# Patient Record
Sex: Female | Born: 1960
Health system: Southern US, Community
[De-identification: ages and names within clinical notes are randomized; demographics above are authoritative.]

## PROBLEM LIST (undated history)

## (undated) ENCOUNTER — Emergency Department (HOSPITAL_COMMUNITY): Admission: EM | Payer: Self-pay | Source: Home / Self Care

## (undated) DIAGNOSIS — F32A Depression, unspecified: Secondary | ICD-10-CM

## (undated) DIAGNOSIS — Z550 Illiteracy and low-level literacy: Secondary | ICD-10-CM

## (undated) DIAGNOSIS — K5900 Constipation, unspecified: Secondary | ICD-10-CM

## (undated) DIAGNOSIS — D689 Coagulation defect, unspecified: Secondary | ICD-10-CM

## (undated) DIAGNOSIS — Z933 Colostomy status: Secondary | ICD-10-CM

## (undated) DIAGNOSIS — G479 Sleep disorder, unspecified: Secondary | ICD-10-CM

## (undated) HISTORY — DX: Depression, unspecified: F32.A

## (undated) HISTORY — DX: Colostomy status: Z93.3

## (undated) HISTORY — DX: Illiteracy and low-level literacy: Z55.0

## (undated) HISTORY — DX: Coagulation defect, unspecified: D68.9

## (undated) HISTORY — DX: Constipation, unspecified: K59.00

---

## 1988-11-22 HISTORY — PX: DILATION AND CURETTAGE OF UTERUS: SHX78

## 1991-11-23 HISTORY — PX: APPENDECTOMY: SHX54

## 1992-11-22 HISTORY — PX: ABDOMINAL HYSTERECTOMY: SHX81

## 1998-03-18 ENCOUNTER — Emergency Department (HOSPITAL_COMMUNITY): Admission: EM | Admit: 1998-03-18 | Discharge: 1998-03-18 | Payer: Self-pay

## 2001-06-22 ENCOUNTER — Encounter (INDEPENDENT_AMBULATORY_CARE_PROVIDER_SITE_OTHER): Payer: Self-pay | Admitting: *Deleted

## 2001-06-22 LAB — CONVERTED CEMR LAB

## 2001-07-19 ENCOUNTER — Encounter: Admission: RE | Admit: 2001-07-19 | Discharge: 2001-07-19 | Payer: Self-pay | Admitting: Family Medicine

## 2001-08-03 ENCOUNTER — Encounter: Admission: RE | Admit: 2001-08-03 | Discharge: 2001-08-03 | Payer: Self-pay | Admitting: Family Medicine

## 2002-07-10 ENCOUNTER — Emergency Department (HOSPITAL_COMMUNITY): Admission: EM | Admit: 2002-07-10 | Discharge: 2002-07-10 | Payer: Self-pay | Admitting: Emergency Medicine

## 2002-07-12 ENCOUNTER — Emergency Department (HOSPITAL_COMMUNITY): Admission: EM | Admit: 2002-07-12 | Discharge: 2002-07-12 | Payer: Self-pay | Admitting: Emergency Medicine

## 2002-09-24 ENCOUNTER — Encounter: Admission: RE | Admit: 2002-09-24 | Discharge: 2002-09-24 | Payer: Self-pay | Admitting: Family Medicine

## 2002-10-24 ENCOUNTER — Encounter: Admission: RE | Admit: 2002-10-24 | Discharge: 2002-10-24 | Payer: Self-pay | Admitting: Family Medicine

## 2002-11-14 ENCOUNTER — Encounter: Payer: Self-pay | Admitting: Sports Medicine

## 2002-11-14 ENCOUNTER — Encounter: Admission: RE | Admit: 2002-11-14 | Discharge: 2002-11-14 | Payer: Self-pay | Admitting: Sports Medicine

## 2004-06-19 ENCOUNTER — Emergency Department (HOSPITAL_COMMUNITY): Admission: EM | Admit: 2004-06-19 | Discharge: 2004-06-20 | Payer: Self-pay | Admitting: Emergency Medicine

## 2004-09-02 ENCOUNTER — Emergency Department (HOSPITAL_COMMUNITY): Admission: EM | Admit: 2004-09-02 | Discharge: 2004-09-03 | Payer: Self-pay | Admitting: Emergency Medicine

## 2004-10-06 ENCOUNTER — Emergency Department (HOSPITAL_COMMUNITY): Admission: EM | Admit: 2004-10-06 | Discharge: 2004-10-06 | Payer: Self-pay | Admitting: Family Medicine

## 2005-11-16 ENCOUNTER — Emergency Department (HOSPITAL_COMMUNITY): Admission: EM | Admit: 2005-11-16 | Discharge: 2005-11-16 | Payer: Self-pay | Admitting: Emergency Medicine

## 2007-01-19 DIAGNOSIS — H9190 Unspecified hearing loss, unspecified ear: Secondary | ICD-10-CM | POA: Insufficient documentation

## 2007-01-19 DIAGNOSIS — N951 Menopausal and female climacteric states: Secondary | ICD-10-CM | POA: Insufficient documentation

## 2007-01-19 DIAGNOSIS — F17200 Nicotine dependence, unspecified, uncomplicated: Secondary | ICD-10-CM | POA: Insufficient documentation

## 2007-01-20 ENCOUNTER — Encounter (INDEPENDENT_AMBULATORY_CARE_PROVIDER_SITE_OTHER): Payer: Self-pay | Admitting: *Deleted

## 2008-03-29 ENCOUNTER — Emergency Department (HOSPITAL_COMMUNITY): Admission: EM | Admit: 2008-03-29 | Discharge: 2008-03-29 | Payer: Self-pay | Admitting: Family Medicine

## 2009-07-23 ENCOUNTER — Emergency Department (HOSPITAL_COMMUNITY): Admission: EM | Admit: 2009-07-23 | Discharge: 2009-07-23 | Payer: Self-pay | Admitting: Emergency Medicine

## 2011-08-18 LAB — POCT RAPID STREP A: Streptococcus, Group A Screen (Direct): NEGATIVE

## 2011-09-17 ENCOUNTER — Emergency Department (HOSPITAL_COMMUNITY)
Admission: EM | Admit: 2011-09-17 | Discharge: 2011-09-18 | Disposition: A | Discharge status: Home / Self Care | Payer: 59 | Attending: Emergency Medicine | Admitting: Emergency Medicine

## 2011-09-17 ENCOUNTER — Emergency Department (HOSPITAL_COMMUNITY): Payer: 59

## 2011-09-17 DIAGNOSIS — L84 Corns and callosities: Secondary | ICD-10-CM | POA: Insufficient documentation

## 2011-09-17 DIAGNOSIS — M79609 Pain in unspecified limb: Secondary | ICD-10-CM | POA: Insufficient documentation

## 2011-10-18 ENCOUNTER — Encounter: Payer: Self-pay | Admitting: Family Medicine

## 2011-10-18 ENCOUNTER — Ambulatory Visit (INDEPENDENT_AMBULATORY_CARE_PROVIDER_SITE_OTHER): Payer: 59 | Admitting: Family Medicine

## 2011-10-18 VITALS — BP 122/79 | HR 67 | Temp 98.2°F | Ht <= 58 in | Wt 73.0 lb

## 2011-10-18 DIAGNOSIS — N951 Menopausal and female climacteric states: Secondary | ICD-10-CM

## 2011-10-18 DIAGNOSIS — L84 Corns and callosities: Secondary | ICD-10-CM | POA: Insufficient documentation

## 2011-10-18 DIAGNOSIS — H9190 Unspecified hearing loss, unspecified ear: Secondary | ICD-10-CM

## 2011-10-18 DIAGNOSIS — F17200 Nicotine dependence, unspecified, uncomplicated: Secondary | ICD-10-CM

## 2011-10-18 MED ORDER — SALICYLIC ACID 40 % EX PADS: 1.0000 [IU] | MEDICATED_PAD | CUTANEOUS | Status: DC

## 2011-10-18 NOTE — Patient Instructions (Signed)
I think you have a corn.   Clean toe. Shave down any excess skin. Then apply the plaster on your toe; keep it on for 48 hours and keep your toe dry. Clean toe then file down any excess skin and repeat until the corn goes away.  Follow-up with me in 2 weeks so I can see if the corn is improving and for a full physical.

## 2011-10-18 NOTE — Progress Notes (Signed)
  Subjective:    Patient ID: [name] [name], female    DOB: [birth year], 50 y.o.   MRN: [id number]  HPI New patient here to get established but also presents with right toe lesion that is her primary reason for coming.  No significant medical history. Does not take medications on a regular basis. Occasional Tylenol/ibuprofen.   Lesion at the bottom of right toe began 2-3 months ago. Started having pain initially and developed callus. Then stepped on a rock and pain became worse. Denies discharge or bleeding. Denies fever, decreased sensation, difficulty walking. Has tried picking at lesion with needle to remove skin but has not helped it.  Exacerbated by: putting weight on toe or palpation Alleviated by: nothing.   Review of Systems Per HPI.    Objective:   Physical Exam Gen: NAD, accompanied by daughter Psych: engaged, pleasant, difficulty understanding her English but she seems to understand most of what I say, her daughter serves as interpreter  CV: RRR Pulm: no increased WOB, CTAB Ext: no edema Skin:   Right toe: mildly thickened area of skin on medial plantar aspect of right toe; able to see concentric areas of thickening consistent with corn as opposed to callus; two punctate areas of dried blood within skin but no splinters or other foreign objects visualized; no warmth, erythema, swelling; mild TTP    Assessment & Plan:

## 2011-10-18 NOTE — Assessment & Plan Note (Signed)
Will try salicyclic acid plaster.  Will follow-up in 2 weeks when she will get physical.

## 2011-11-05 ENCOUNTER — Ambulatory Visit: Payer: 59 | Admitting: Family Medicine

## 2011-11-12 ENCOUNTER — Encounter: Payer: Self-pay | Admitting: Family Medicine

## 2011-11-12 ENCOUNTER — Other Ambulatory Visit (HOSPITAL_COMMUNITY)
Admission: RE | Admit: 2011-11-12 | Discharge: 2011-11-12 | Disposition: A | Discharge status: Home / Self Care | Payer: 59 | Source: Ambulatory Visit | Attending: Family Medicine | Admitting: Family Medicine

## 2011-11-12 ENCOUNTER — Ambulatory Visit (INDEPENDENT_AMBULATORY_CARE_PROVIDER_SITE_OTHER): Payer: 59 | Admitting: Family Medicine

## 2011-11-12 VITALS — BP 118/74 | HR 71 | Temp 97.7°F | Ht <= 58 in | Wt 75.4 lb

## 2011-11-12 DIAGNOSIS — L989 Disorder of the skin and subcutaneous tissue, unspecified: Secondary | ICD-10-CM

## 2011-11-12 DIAGNOSIS — L84 Corns and callosities: Secondary | ICD-10-CM

## 2011-11-12 DIAGNOSIS — Z124 Encounter for screening for malignant neoplasm of cervix: Secondary | ICD-10-CM

## 2011-11-12 DIAGNOSIS — F17200 Nicotine dependence, unspecified, uncomplicated: Secondary | ICD-10-CM

## 2011-11-12 DIAGNOSIS — Z01419 Encounter for gynecological examination (general) (routine) without abnormal findings: Secondary | ICD-10-CM | POA: Insufficient documentation

## 2011-11-12 MED ORDER — NICOTINE POLACRILEX 4 MG MT GUM: 4.0000 mg | CHEWING_GUM | OROMUCOSAL | Status: AC | PRN

## 2011-11-12 MED ORDER — NICOTINE POLACRILEX 4 MG MT GUM: 4.0000 mg | CHEWING_GUM | OROMUCOSAL | Status: DC | PRN

## 2011-11-12 NOTE — Assessment & Plan Note (Signed)
Improved. Continue plasters with duct tape.

## 2011-11-12 NOTE — Assessment & Plan Note (Signed)
Few millimeters in width and length. Likely a mole. It has irregular borders but has been there for a long period of time and does not seem to be changing. Discussed options with family. Suggested biopsy today but thought it would also be appropriate to wait and re-evaluate in 6 months. She does not have risk factors for melanoma (she is dark-skinned, does not have moles any where else, and this would be an unusual spot for a melanoma).

## 2011-11-12 NOTE — Progress Notes (Signed)
  Subjective:    Patient ID: [name] [name], female    DOB: January 12, 1961, 50 y.o.   MRN: [id number]  HPI 1. Corn on toe Using plasters. Finished box. Feels it is improved but still painful sometimes with deep palpation.   2. Physical No other complaints. Wondering if she has high blood pressure.  Not sure when she had her last Pap.  Hysterectomy at [facility] hospital in the past.   3. Tobacco Interested in quitting.  Review of Systems Denies chest pain, difficulty breathing, abdominal pain/nausea/vomiting/diarrhea/constipation, vaginal irritation/abnormal vaginal discharge Sexually active with husband only but not for a while    Objective:   Physical Exam Gen: NAD, accompanied by daughter who interpreters Psych: very pleasant, engaged, appropriate; fully alert and oriented Neuro: grossly intact; gait normal HEENT:   Head, ears, eyes, nose: normal   Mouth: poor dentition, missing few teeth; no tonsillar adenopathy; no oropharyngeal lesions CV: RRR, no m/r/g Pulm: CTAB, no w/r/r; good aeration Abd: NABS, soft, NT, ND Ext: no edema Skin: thickened area of skin with central point on plantar aspect of rig t toe; seems to be less thick than a month ago at her previous visit GU:    Vulva: 3 mm x 5 mm dark lesion, 5 o'clock position, lateral to left vulva; patient reports some times itchy; has had for a long time; does not think it is changing   Vagina: non-tender   Cervix: none, cuff only; no discharge Skin: grossly normal with occasional flesh-colored mole; no significant solar freckling    Assessment & Plan:

## 2011-11-12 NOTE — Patient Instructions (Signed)
If your lab results are normal, I will send you a letter with the results. If abnormal, someone at the clinic will get in touch with you.   Right toe corn: use the plaster on the toe. Keep it on for 3-4 days then wrap toe with duct tape. You may also try getting a cushion at the pharmacy to put on that toe.  Dark skin spot on bottom: follow-up in 6 months or sooner if you feel like it is changing.  Smoking: I am glad to hear you want to quit. Try the gum or the lozenges whenever you have an urge to smoke.

## 2011-11-12 NOTE — Assessment & Plan Note (Signed)
Smoked for a long time. Urge to smoke after dinner and when stressed. Interested in quitting. Has not tried anything.  Will try nicotine replacement gum/lozenges to be used after meals and when she feels an urge to smoke. Follow-up in 3-6 months.

## 2011-11-18 ENCOUNTER — Encounter: Payer: Self-pay | Admitting: Family Medicine

## 2012-06-02 ENCOUNTER — Ambulatory Visit: Payer: 59 | Admitting: Family Medicine

## 2012-06-07 ENCOUNTER — Ambulatory Visit (INDEPENDENT_AMBULATORY_CARE_PROVIDER_SITE_OTHER): Payer: 59 | Admitting: Family Medicine

## 2012-06-07 VITALS — BP 145/99 | HR 89 | Temp 98.5°F | Ht <= 58 in | Wt 75.0 lb

## 2012-06-07 DIAGNOSIS — L84 Corns and callosities: Secondary | ICD-10-CM

## 2012-06-07 MED ORDER — SALICYLIC ACID 40 % EX PADS: 1.0000 [IU] | MEDICATED_PAD | CUTANEOUS | Status: DC

## 2012-06-07 NOTE — Progress Notes (Signed)
  Subjective:    Patient ID: [name] [name], female    DOB: 1961-09-14, 51 y.o.   MRN: [id number]  HPI Corn on right toe has returned  She is wearing flip-flops today. She alternates with white tennis shoes.   Review of Systems  Past Medical History, Family History, Social History, Allergies, and Medications reviewed. Significant for tobacco dependence, hearing difficulty    Objective:   Physical Exam GEN: NAD; well-nourished, -appearing; thin SKIN: corn on outer right 1st toe with central core; tender  PROCEDURES Corn pared down with #15 blade Central core still visible, however, stopped procedure due to pain     Assessment & Plan:

## 2012-06-07 NOTE — Patient Instructions (Addendum)
Follow-up as needed.   Try the patch and wrap with duct tape. Keep in place for 2 days then replace. Use for 2 weeks.

## 2012-06-07 NOTE — Assessment & Plan Note (Signed)
Pared down. Does not appear verrucous.  Salicylic acid plasters and duct tape for 2 weeks.  Follow-up as needed.

## 2013-12-10 ENCOUNTER — Ambulatory Visit (INDEPENDENT_AMBULATORY_CARE_PROVIDER_SITE_OTHER): Payer: 59 | Admitting: Family Medicine

## 2013-12-10 ENCOUNTER — Encounter: Payer: Self-pay | Admitting: Family Medicine

## 2013-12-10 VITALS — BP 121/74 | HR 78 | Temp 98.8°F | Ht <= 58 in | Wt 79.0 lb

## 2013-12-10 DIAGNOSIS — G5790 Unspecified mononeuropathy of unspecified lower limb: Secondary | ICD-10-CM

## 2013-12-10 DIAGNOSIS — N951 Menopausal and female climacteric states: Secondary | ICD-10-CM

## 2013-12-10 DIAGNOSIS — F17200 Nicotine dependence, unspecified, uncomplicated: Secondary | ICD-10-CM

## 2013-12-10 LAB — BASIC METABOLIC PANEL
BUN: 9 mg/dL (ref 6–23)
CHLORIDE: 101 meq/L (ref 96–112)
CO2: 30 meq/L (ref 19–32)
Calcium: 9.3 mg/dL (ref 8.4–10.5)
Creat: 0.57 mg/dL (ref 0.50–1.10)
GLUCOSE: 73 mg/dL (ref 70–99)
POTASSIUM: 3.5 meq/L (ref 3.5–5.3)
Sodium: 139 mEq/L (ref 135–145)

## 2013-12-10 LAB — CBC
HEMATOCRIT: 38.7 % (ref 36.0–46.0)
HEMOGLOBIN: 12 g/dL (ref 12.0–15.0)
MCH: 22.9 pg — ABNORMAL LOW (ref 26.0–34.0)
MCHC: 31 g/dL (ref 30.0–36.0)
MCV: 73.7 fL — ABNORMAL LOW (ref 78.0–100.0)
Platelets: 195 10*3/uL (ref 150–400)
RBC: 5.25 MIL/uL — ABNORMAL HIGH (ref 3.87–5.11)
RDW: 15.7 % — ABNORMAL HIGH (ref 11.5–15.5)
WBC: 5.6 10*3/uL (ref 4.0–10.5)

## 2013-12-10 LAB — POCT GLYCOSYLATED HEMOGLOBIN (HGB A1C): HEMOGLOBIN A1C: 4.9

## 2013-12-10 MED ORDER — NICOTINE 7 MG/24HR TD PT24: 7.0000 mg | MEDICATED_PATCH | Freq: Every day | TRANSDERMAL | Status: DC

## 2013-12-10 MED ORDER — NICOTINE 14 MG/24HR TD PT24: 14.0000 mg | MEDICATED_PATCH | Freq: Every day | TRANSDERMAL | Status: DC

## 2013-12-10 NOTE — Assessment & Plan Note (Signed)
A: smoker x 36 yrs. 5 cigs/day. Marl[place] reg. Ready to quit. Daughter smokes too and is ready to quit.  P: Nicotine patch Health coaching.

## 2013-12-10 NOTE — Assessment & Plan Note (Signed)
A: patient is describing hot flashes. She is a smoker. She does not drink coffee or chocolate. P: Reassurance Information Smoking cessation PCP f/u.

## 2013-12-10 NOTE — Assessment & Plan Note (Signed)
A: foot burning with numbness upon standing for prolonged periods and first thing in the morning. Patient adamantly denies pain. Sounds like neuropathy. Considered plantar fascitis. Normal peripheral pulse in feet. P: Check B12, TSH, A1c PCP f/u to review labs and do full vascular exam including femoral and popliteal pulses. Smoking cessation

## 2013-12-10 NOTE — Patient Instructions (Signed)
Ms. [name] [name],  Thank you for coming in today. It was a pleasure meeting you.   Smoking cessation support: smoking cessation hotline: 1-800-QUIT-NOW.  Here is the [facility]: [phone]  I have sent nicotine patch to your pharmacy as we dicussed.  Please start nicotine patch.  You're whole body is hot due to hot flashes.  To improve hot flashes: avoid smoking, avoid caffeine (coffee, tea, chocolate), avoid spicy foods. Exercise daily. Drink plenty of water.   Please f/u with Dr. Waynetta SandyWight in 2-3 weeks to meet your new primary doctor and review your labs.

## 2013-12-10 NOTE — Progress Notes (Addendum)
   Subjective:    Patient ID: [name] [name], female    DOB: [birth year], 53 y.o.   MRN: [id number]  HPI 53 yo F presents with her adult daughter and granddaughter for same day visit:  1. Hot feet: worse in the AM upon first getting out of bed. Also when standing  For 2-3 hrs. No pain. No coldness. Worse on dorsal foot. Some numbness. Denies pens and needle sensation.   2. Hot body: off and on throughout the day. Worse at night. Associated with  Sweating. Present since 01/2013.   Soc hx: smoker 5 cigs/day x 36 years (since age 53). Ready to quit. Has tried gum in the past, burned her mouth. Her daughter also smokes and would like to quit.  Review of Systems As per HPI     Objective:   Physical Exam BP 121/74  Pulse 78  Temp(Src) 98.8 F (37.1 C) (Oral)  Ht 4\' 8"  (1.422 m)  Wt 79 lb (35.834 kg)  BMI 17.72 kg/m2 General appearance: alert, cooperative and no distress Neck: no adenopathy, no carotid bruit, no JVD, supple, symmetrical, trachea midline and thyroid not enlarged, symmetric, no tenderness/mass/nodules Lungs: clear to auscultation bilaterally Heart: regular rate and rhythm, S1, S2 normal, no murmur, click, rub or gallop Extremities: extremities normal, atraumatic, no cyanosis or edema, 2+ DP and PT pulse. Feet warm and dry. Feet non tender to palpation. No rash or lesions.   Lab Results  Component Value Date   HGBA1C 4.9 12/10/2013          Assessment & Plan:

## 2013-12-11 LAB — TSH: TSH: 0.311 u[IU]/mL — ABNORMAL LOW (ref 0.350–4.500)

## 2013-12-11 LAB — VITAMIN B12: Vitamin B-12: 397 pg/mL (ref 211–911)

## 2013-12-12 ENCOUNTER — Encounter: Payer: Self-pay | Admitting: Family Medicine

## 2014-01-01 ENCOUNTER — Ambulatory Visit (INDEPENDENT_AMBULATORY_CARE_PROVIDER_SITE_OTHER): Payer: 59 | Admitting: Family Medicine

## 2014-01-01 ENCOUNTER — Encounter: Payer: Self-pay | Admitting: Family Medicine

## 2014-01-01 VITALS — BP 120/80 | HR 67 | Ht <= 58 in | Wt 79.5 lb

## 2014-01-01 DIAGNOSIS — F17200 Nicotine dependence, unspecified, uncomplicated: Secondary | ICD-10-CM

## 2014-01-01 DIAGNOSIS — G5790 Unspecified mononeuropathy of unspecified lower limb: Secondary | ICD-10-CM

## 2014-01-01 MED ORDER — GABAPENTIN 100 MG PO CAPS: 100.0000 mg | ORAL_CAPSULE | Freq: Three times a day (TID) | ORAL | Status: DC

## 2014-01-01 NOTE — Patient Instructions (Addendum)
It was nice to meet you today.  We will re-check your Thyroid function studies today. If the results are normal I will send a letter, if they show something that needs to be treated I will call you.  For your foot pain:  We will try Gabapentin.  Start by taking 100mg  three times a day as needed.  If you do not see any relief in 2 days, try 200mg  3 times a day as needed.  If after 2 more days and still no relief, increase to 300mg  3 times a day. Continue to increase by 100mg  up to 500mg  a dose.  This medication can make you feel dizzy, drowsy, headache, cause tremors, or make you act differently. If you experience any of those symptoms please discontinue and call the clinic/schedule another appointment.   Smoking cessation support: smoking cessation hotline: 1-800-QUIT-NOW. Here is the number to [facility]: [phone]  Please do not hesitate to call the clinic to help you with quitting smoking. We do have our pharmacist Dr. Raymondo BandKoval that we could schedule you an appointment with to help.

## 2014-01-02 ENCOUNTER — Encounter: Payer: Self-pay | Admitting: Family Medicine

## 2014-01-02 LAB — T4, FREE: Free T4: 1.18 ng/dL (ref 0.80–1.80)

## 2014-01-02 LAB — TSH: TSH: 0.418 u[IU]/mL (ref 0.350–4.500)

## 2014-01-02 LAB — T3, FREE: T3, Free: 3.3 pg/mL (ref 2.3–4.2)

## 2014-01-02 NOTE — Progress Notes (Signed)
   Subjective:    Patient ID: [name] GreenlandPem [name], female    DOB: 1960-12-22, 53 y.o.   MRN: [id number]  HPI  Accompanied by daughter who interprets for her, declines use of phone interpreter  CC: feet pain  # Feet pain - has been going on since last April, seen in office then and a couple weeks ago - describes as "burning", rates as 6/10. Describes pins and needles sensation - right foot > left foot - the pain does wake her up at night. - present throughout the day, getting worse over past month - Better with: massage (daughter works at [employer]), but only for a little while - Worse with: doesn't know - there is no pain that goes up the legs, but she does describe some weakness primarily on outside of her right leg.  # Perimenopausal - also describes continued hot flashes - has had hysterectomy so no menstrual cycles since then  # Smoking cessation - doesn't like the patches, declines additional nicotine replacement at this time - has not gone to [facility] classes or phone hotline - does not want to go to pharm clinic at this time.  Review of Systems Heat intolerance sometimes, no hair loss, no tremors, no weight changes, no CP, no SOB. No bladder/bowel incontinence.     Objective:   Physical Exam BP 120/80  Pulse 67  Ht 4\' 8"  (1.422 m)  Wt 79 lb 8 oz (36.061 kg)  BMI 17.83 kg/m2  General: NAD HEENT: PERRL, EOMI.  Neck: no thyromegaly or nodules CV: RRR, normal heart sounds, no murmurs. 2+ pulses in radial, PT, DP, popliteal Resp: CTAB, normal effort Abdomen: soft, ntnd Ext: no edema or cyanosis. There is no tenderness to palpation of both feet, ankles, or calves. Neuro: bilaterally strength hip flexion 5/5, leg extension/flexion 5/5, toe flexion/extension. Sensation to light touch intact over top/bottom of feet and toes, calves. SLR negative bilaterally. 2+ patellar and achilles reflexes bilaterally.     Assessment & Plan:  See Problem List documentation

## 2014-01-02 NOTE — Assessment & Plan Note (Signed)
A: unclear exact etiology but description most consistent with neuropathy, though no clear evidence why she would be neuropathic (normal B12, A1c, repeat thyroid studies normal). There are no radicular signs, and no neurologic deficits.  P: Repeat TFTs today normal. Will try gabapentin for symptom relief.

## 2014-01-02 NOTE — Assessment & Plan Note (Signed)
Continues to smoke, does not like the patches. She has not used the [facility] classes or hotline, gave this information again and offered additional support if needed including Pharm clinic.

## 2014-05-01 ENCOUNTER — Emergency Department (HOSPITAL_COMMUNITY)
Admission: EM | Admit: 2014-05-01 | Discharge: 2014-05-02 | Disposition: A | Discharge status: Home / Self Care | Payer: 59 | Attending: Emergency Medicine | Admitting: Emergency Medicine

## 2014-05-01 ENCOUNTER — Encounter (HOSPITAL_COMMUNITY): Payer: Self-pay | Admitting: Emergency Medicine

## 2014-05-01 ENCOUNTER — Emergency Department (HOSPITAL_COMMUNITY): Payer: 59

## 2014-05-01 DIAGNOSIS — M549 Dorsalgia, unspecified: Secondary | ICD-10-CM | POA: Insufficient documentation

## 2014-05-01 DIAGNOSIS — Z79899 Other long term (current) drug therapy: Secondary | ICD-10-CM | POA: Insufficient documentation

## 2014-05-01 DIAGNOSIS — B9789 Other viral agents as the cause of diseases classified elsewhere: Secondary | ICD-10-CM | POA: Insufficient documentation

## 2014-05-01 DIAGNOSIS — F17200 Nicotine dependence, unspecified, uncomplicated: Secondary | ICD-10-CM | POA: Insufficient documentation

## 2014-05-01 DIAGNOSIS — R509 Fever, unspecified: Secondary | ICD-10-CM | POA: Insufficient documentation

## 2014-05-01 DIAGNOSIS — B349 Viral infection, unspecified: Secondary | ICD-10-CM

## 2014-05-01 LAB — CBC WITH DIFFERENTIAL/PLATELET
BASOS ABS: 0 10*3/uL (ref 0.0–0.1)
Basophils Relative: 0 % (ref 0–1)
Eosinophils Absolute: 0.1 10*3/uL (ref 0.0–0.7)
Eosinophils Relative: 1 % (ref 0–5)
HEMATOCRIT: 34.7 % — AB (ref 36.0–46.0)
Hemoglobin: 11.1 g/dL — ABNORMAL LOW (ref 12.0–15.0)
LYMPHS PCT: 10 % — AB (ref 12–46)
Lymphs Abs: 1.5 10*3/uL (ref 0.7–4.0)
MCH: 22.5 pg — ABNORMAL LOW (ref 26.0–34.0)
MCHC: 32 g/dL (ref 30.0–36.0)
MCV: 70.2 fL — ABNORMAL LOW (ref 78.0–100.0)
MONOS PCT: 6 % (ref 3–12)
Monocytes Absolute: 0.9 10*3/uL (ref 0.1–1.0)
NEUTROS ABS: 12.1 10*3/uL — AB (ref 1.7–7.7)
Neutrophils Relative %: 83 % — ABNORMAL HIGH (ref 43–77)
Platelets: 180 10*3/uL (ref 150–400)
RBC: 4.94 MIL/uL (ref 3.87–5.11)
RDW: 14.7 % (ref 11.5–15.5)
WBC: 14.6 10*3/uL — AB (ref 4.0–10.5)

## 2014-05-01 LAB — COMPREHENSIVE METABOLIC PANEL
ALT: 8 U/L (ref 0–35)
AST: 14 U/L (ref 0–37)
Albumin: 4.1 g/dL (ref 3.5–5.2)
Alkaline Phosphatase: 113 U/L (ref 39–117)
BILIRUBIN TOTAL: 0.6 mg/dL (ref 0.3–1.2)
BUN: 5 mg/dL — ABNORMAL LOW (ref 6–23)
CO2: 28 mEq/L (ref 19–32)
Calcium: 9.5 mg/dL (ref 8.4–10.5)
Chloride: 94 mEq/L — ABNORMAL LOW (ref 96–112)
Creatinine, Ser: 0.65 mg/dL (ref 0.50–1.10)
GFR calc non Af Amer: >90 mL/min (ref >90)
Glucose, Bld: 108 mg/dL — ABNORMAL HIGH (ref 70–99)
Potassium: 3.7 mEq/L (ref 3.7–5.3)
SODIUM: 136 meq/L — AB (ref 137–147)
TOTAL PROTEIN: 7.8 g/dL (ref 6.0–8.3)

## 2014-05-01 LAB — URINALYSIS, ROUTINE W REFLEX MICROSCOPIC
Bilirubin Urine: NEGATIVE
GLUCOSE, UA: NEGATIVE mg/dL
Hgb urine dipstick: NEGATIVE
Ketones, ur: NEGATIVE mg/dL
Nitrite: NEGATIVE
PH: 6.5 (ref 5.0–8.0)
Protein, ur: NEGATIVE mg/dL
SPECIFIC GRAVITY, URINE: 1.006 (ref 1.005–1.030)
Urobilinogen, UA: 0.2 mg/dL (ref 0.0–1.0)

## 2014-05-01 LAB — URINE MICROSCOPIC-ADD ON

## 2014-05-01 MED ORDER — ACETAMINOPHEN 325 MG PO TABS
650.0000 mg | ORAL_TABLET | Freq: Once | ORAL | Status: AC
  Administered 2014-05-01: 650 mg via ORAL
  Filled 2014-05-01: qty 2

## 2014-05-01 NOTE — ED Notes (Signed)
Pt in c/o fever since Monday with chills and body aches, denies cough or congestion, been taking tylenol at home, last dose PTA

## 2014-05-01 NOTE — ED Provider Notes (Signed)
CSN: [id number]     Arrival date & time 05/01/14  1924 History  This chart was scribed for Dierdre Forth, PA-C, non-physician practitioner working with Ethelda Chick, MD by Nicholos Johns, ED scribe. This patient was seen in room TR05C/TR05C and the patient's care was started at 9:46 PM.    Chief Complaint  Patient presents with  . Fever   The history is provided by the patient, medical records and a relative. No language interpreter was used.  HPI Comments: [name] [name] is a 53 y.o. female who presents to the Emergency Department complaining of subjective fever, chills, congestion, watery eyes, back pain, intermittent productive cough, and HA; onset 3 days ago. Pt repeated states that she is cold. Took 2 Tylenol which provides temporary relief but says fever returns within 1-2 hours. No sick contacts at home. Denies abdominal pain or rash.  She denies neck pain, neck stiffness, chest pain, shortness of breath, abdominal pain, nausea, vomiting, diarrhea, weakness, dizziness, syncope.  History reviewed. No pertinent past medical history. Past Surgical History  Procedure Laterality Date  . Cesarean section      x2   . Abdominal hysterectomy     History reviewed. No pertinent family history. History  Substance Use Topics  . Smoking status: Current Every Day Smoker -- 0.50 packs/day    Types: Cigarettes  . Smokeless tobacco: Not on file  . Alcohol Use: No   OB History   Grav Para Term Preterm Abortions TAB SAB Ect Mult Living                 Review of Systems  Constitutional: Positive for fever and chills. Negative for diaphoresis, appetite change, fatigue and unexpected weight change.  HENT: Negative for mouth sores.   Eyes: Negative for visual disturbance.  Respiratory: Positive for cough. Negative for chest tightness, shortness of breath and wheezing.   Cardiovascular: Negative for chest pain.  Gastrointestinal: Negative for nausea, vomiting, abdominal pain, diarrhea and  constipation.  Endocrine: Negative for polydipsia, polyphagia and polyuria.  Genitourinary: Negative for dysuria, urgency, frequency and hematuria.  Musculoskeletal: Positive for back pain. Negative for neck stiffness.  Skin: Negative for rash.  Allergic/Immunologic: Negative for immunocompromised state.  Neurological: Negative for syncope, light-headedness and headaches.  Hematological: Does not bruise/bleed easily.  Psychiatric/Behavioral: Negative for sleep disturbance. The patient is not nervous/anxious.   All other systems reviewed and are negative.  Allergies  Review of patient's allergies indicates no known allergies.  Home Medications   Prior to Admission medications   Medication Sig Start Date End Date Taking? Authorizing Provider  gabapentin (NEURONTIN) 100 MG capsule Take 1 capsule (100 mg total) by mouth 3 (three) times daily. 01/01/14  Yes Tawni Carnes, MD   Triage vitals: BP 130/87  Pulse 90  Temp(Src) 98.4 F (36.9 C) (Oral)  Resp 18  SpO2 98% Physical Exam  Nursing note and vitals reviewed. Constitutional: She is oriented to person, place, and time. She appears well-developed and well-nourished. No distress.  Awake, alert, nontoxic appearance  HENT:  Head: Normocephalic and atraumatic.  Mouth/Throat: Oropharynx is clear and moist. No oropharyngeal exudate.  Eyes: Conjunctivae are normal. No scleral icterus.  Neck: Normal range of motion. Neck supple.  Full ROM without pain No midline or paraspinal tenderness  no nuchal rigidity  Cardiovascular: Normal rate, regular rhythm, normal heart sounds and intact distal pulses.   No murmur heard. Pulmonary/Chest: Effort normal and breath sounds normal. No respiratory distress. She has no wheezes.  Clear and  equal breath sounds  Abdominal: Soft. Bowel sounds are normal. She exhibits no distension and no mass. There is no tenderness. There is no rebound and no guarding.  Abdomen soft and nontender  Musculoskeletal:  Normal range of motion. She exhibits no edema.  Full range of motion of the T-spine and L-spine No tenderness to palpation of the spinous processes of the T-spine or L-spine No tenderness to palpation of the paraspinous muscles of the L-spine  Lymphadenopathy:    She has no cervical adenopathy.  Neurological: She is alert and oriented to person, place, and time. She has normal reflexes.  Speech is clear and goal oriented, follows commands Normal 5/5 strength in upper and lower extremities bilaterally including dorsiflexion and plantar flexion, strong and equal grip strength Sensation normal to light and sharp touch Moves extremities without ataxia, coordination intact Normal gait Normal balance   Skin: Skin is warm and dry. No rash noted. She is not diaphoretic. No erythema.  Psychiatric: She has a normal mood and affect. Her behavior is normal.    ED Course  Procedures (including critical care time) DIAGNOSTIC STUDIES: Oxygen Saturation is 98% on room air, normal by my interpretation.    COORDINATION OF CARE: At 9:51 PM: Discussed treatment plan with patient which includes chest x-ray. Patient agrees.   Labs Review Labs Reviewed  URINALYSIS, ROUTINE W REFLEX MICROSCOPIC - Abnormal; Notable for the following:    Leukocytes, UA TRACE (*)    All other components within normal limits  CBC WITH DIFFERENTIAL - Abnormal; Notable for the following:    WBC 14.6 (*)    Hemoglobin 11.1 (*)    HCT 34.7 (*)    MCV 70.2 (*)    MCH 22.5 (*)    Neutrophils Relative % 83 (*)    Lymphocytes Relative 10 (*)    Neutro Abs 12.1 (*)    All other components within normal limits  COMPREHENSIVE METABOLIC PANEL - Abnormal; Notable for the following:    Sodium 136 (*)    Chloride 94 (*)    Glucose, Bld 108 (*)    BUN 5 (*)    All other components within normal limits  URINE MICROSCOPIC-ADD ON    Imaging Review Dg Chest 2 View  05/01/2014   CLINICAL DATA:  Cough and fever  EXAM: CHEST  2  VIEW  COMPARISON:  None.  FINDINGS: Normal heart size and pulmonary vascularity. Mild hyperinflation suggesting emphysema. No focal airspace disease or consolidation in the lungs. No blunting of costophrenic angles. No pneumothorax.  IMPRESSION: Probable emphysematous changes in the lungs. No evidence of active pulmonary disease.   Electronically Signed   By: Burman NievesWilliam  Stevens M.D.   On: 05/01/2014 23:07     EKG Interpretation None      MDM   Final diagnoses:  Fever  Viral syndrome   [name] [name] presents with fevers for several days, cough, chills and body aches.  Fever to 101.9 here in the emergency department.  Urinalysis without evidence of urinary tract infection, CBC with leukocytosis of 14.6. CMP unremarkable and chest x-ray without evidence of pneumonia.  No source of fever at this time.  I have personally reviewed patient's vitals, nursing note and any pertinent labs or imaging.  I performed an undressed physical exam.    At this time, it has been determined that no acute conditions requiring further emergency intervention. The patient/guardian have been advised of the diagnosis and plan. I reviewed all labs and imaging including any potential incidental  findings. We have discussed signs and symptoms that warrant return to the ED, such as worsening fever, rash, neck stiffness, altered level of consciousness.  Patient/guardian has voiced understanding and agreed to follow-up with the PCP in 48 hours for further evaluation.  Vital signs are stable at discharge.   BP 106/78  Pulse 77  Temp(Src) 98.9 F (37.2 C) (Oral)  Resp 18  SpO2 98%  The patient was discussed with and seen by Dr. Judd Lien who agrees with the treatment plan.  I personally performed the services described in this documentation, which was scribed in my presence. The recorded information has been reviewed and is accurate.     Dahlia Client Favio Moder, PA-C 05/02/14 [phone]

## 2014-05-02 NOTE — Discharge Instructions (Signed)
Tylenol 650 milligrams rotated with Motrin 400 mg every 4 hours as needed for fever.  Return to the emergency department if you develop difficulty breathing, productive cough, bloody diarrhea, or any other new and concerning symptoms.   Fever, Adult A fever is a higher than normal body temperature. In an adult, an oral temperature around 98.6 F (37 C) is considered normal. A temperature of 100.4 F (38 C) or higher is generally considered a fever. Mild or moderate fevers generally have no long-term effects and often do not require treatment. Extreme fever (greater than or equal to 106 F or 41.1 C) can cause seizures. The sweating that may occur with repeated or prolonged fever may cause dehydration. Elderly people can develop confusion during a fever. A measured temperature can vary with:  Age.  Time of day.  Method of measurement (mouth, underarm, rectal, or ear). The fever is confirmed by taking a temperature with a thermometer. Temperatures can be taken different ways. Some methods are accurate and some are not.  An oral temperature is used most commonly. Electronic thermometers are fast and accurate.  An ear temperature will only be accurate if the thermometer is positioned as recommended by the manufacturer.  A rectal temperature is accurate and done for those adults who have a condition where an oral temperature cannot be taken.  An underarm (axillary) temperature is not accurate and not recommended. Fever is a symptom, not a disease.  CAUSES   Infections commonly cause fever.  Some noninfectious causes for fever include:  Some arthritis conditions.  Some thyroid or adrenal gland conditions.  Some immune system conditions.  Some types of cancer.  A medicine reaction.  High doses of certain street drugs such as methamphetamine.  Dehydration.  Exposure to high outside or room temperatures.  Occasionally, the source of a fever cannot be determined. This is  sometimes called a "fever of unknown origin" (FUO).  Some situations may lead to a temporary rise in body temperature that may go away on its own. Examples are:  Childbirth.  Surgery.  Intense exercise. HOME CARE INSTRUCTIONS   Take appropriate medicines for fever. Follow dosing instructions carefully. If you use acetaminophen to reduce the fever, be careful to avoid taking other medicines that also contain acetaminophen. Do not take aspirin for a fever if you are younger than age 52. There is an association with Reye's syndrome. Reye's syndrome is a rare but potentially deadly disease.  If an infection is present and antibiotics have been prescribed, take them as directed. Finish them even if you start to feel better.  Rest as needed.  Maintain an adequate fluid intake. To prevent dehydration during an illness with prolonged or recurrent fever, you may need to drink extra fluid.Drink enough fluids to keep your urine clear or pale yellow.  Sponging or bathing with room temperature water may help reduce body temperature. Do not use ice water or alcohol sponge baths.  Dress comfortably, but do not over-bundle. SEEK MEDICAL CARE IF:   You are unable to keep fluids down.  You develop vomiting or diarrhea.  You are not feeling at least partly better after 3 days.  You develop new symptoms or problems. SEEK IMMEDIATE MEDICAL CARE IF:   You have shortness of breath or trouble breathing.  You develop excessive weakness.  You are dizzy or you faint.  You are extremely thirsty or you are making little or no urine.  You develop new pain that was not there before (such as  in the head, neck, chest, back, or abdomen).  You have persistant vomiting and diarrhea for more than 1 to 2 days.  You develop a stiff neck or your eyes become sensitive to light.  You develop a skin rash.  You have a fever or persistent symptoms for more than 2 to 3 days.  You have a fever and your symptoms  suddenly get worse. MAKE SURE YOU:   Understand these instructions.  Will watch your condition.  Will get help right away if you are not doing well or get worse. Document Released: 05/04/2001 Document Revised: 01/31/2012 Document Reviewed: 09/09/2011 [facility] Patient Information 2014 [place], [place].  Viral Infections A viral infection can be caused by different types of viruses.Most viral infections are not serious and resolve on their own. However, some infections may cause severe symptoms and may lead to further complications. SYMPTOMS Viruses can frequently cause:  Minor sore throat.  Aches and pains.  Headaches.  Runny nose.  Different types of rashes.  Watery eyes.  Tiredness.  Cough.  Loss of appetite.  Gastrointestinal infections, resulting in nausea, vomiting, and diarrhea. These symptoms do not respond to antibiotics because the infection is not caused by bacteria. However, you might catch a bacterial infection following the viral infection. This is sometimes called a "superinfection." Symptoms of such a bacterial infection may include:  Worsening sore throat with pus and difficulty swallowing.  Swollen neck glands.  Chills and a high or persistent fever.  Severe headache.  Tenderness over the sinuses.  Persistent overall ill feeling (malaise), muscle aches, and tiredness (fatigue).  Persistent cough.  Yellow, green, or brown mucus production with coughing. HOME CARE INSTRUCTIONS   Only take over-the-counter or prescription medicines for pain, discomfort, diarrhea, or fever as directed by your caregiver.  Drink enough water and fluids to keep your urine clear or pale yellow. Sports drinks can provide valuable electrolytes, sugars, and hydration.  Get plenty of rest and maintain proper nutrition. Soups and broths with crackers or rice are fine. SEEK IMMEDIATE MEDICAL CARE IF:   You have severe headaches, shortness of breath, chest pain, neck  pain, or an unusual rash.  You have uncontrolled vomiting, diarrhea, or you are unable to keep down fluids.  You or your child has an oral temperature above 102 F (38.9 C), not controlled by medicine.  Your baby is older than 3 months with a rectal temperature of 102 F (38.9 C) or higher.  Your baby is 27 months old or younger with a rectal temperature of 100.4 F (38 C) or higher. MAKE SURE YOU:   Understand these instructions.  Will watch your condition.  Will get help right away if you are not doing well or get worse. Document Released: 08/18/2005 Document Revised: 01/31/2012 Document Reviewed: 03/15/2011 [facility] Patient Information 2014 [place], [place].

## 2014-05-02 NOTE — ED Notes (Signed)
Pt. C/o of pain to right side of back, right neck and HA. States she feels better after tylenol. Pt. Warm to touch.

## 2014-05-02 NOTE — ED Notes (Signed)
Dr. Delo at bedside. 

## 2014-05-03 NOTE — ED Provider Notes (Signed)
Medical screening examination/treatment/procedure(s) were conducted as a shared visit with non-physician practitioner(s) and myself.  I personally evaluated the patient during the encounter. Patient is a 53 year old female of [place] descent who presents with complaints of fever, chills, and congestion for the past 3 days. She denies any vomiting or diarrhea. She denies any abdominal pain. There are no urinary complaints. She denies any ill contacts. Patient does speak limited English, however some of the history was taken with the assistance of the daughter who served as Nurse, learning disabilitytranslator and was at bedside.  On exam, vitals are stable and she is afebrile. There is no hypoxia. Head is atraumatic, normocephalic. Neck is supple. There is no adenopathy. Heart is regular rate and rhythm without murmurs. Lungs are clear and equal bilaterally. Abdomen is soft, nontender. Extremities are without edema. Skin is warm and dry there are no lesions or redness noted.  Workup reveals an elevated white count of 14.6, but the remainder the workup is unremarkable. There is no evidence for pneumonia, urinary tract infection, and nothing else on physical exam that would explain her fever. I suspect a viral etiology. She will be recommended antipyretics, plenty of fluids, and when necessary followup if symptoms worsen or change.     Geoffery Lyonsouglas Stephania Macfarlane, MD 05/03/14 [phone]

## 2014-11-06 ENCOUNTER — Encounter (HOSPITAL_COMMUNITY): Payer: Self-pay | Admitting: Emergency Medicine

## 2014-11-06 ENCOUNTER — Observation Stay (HOSPITAL_COMMUNITY)
Admission: EM | Admit: 2014-11-06 | Discharge: 2014-11-07 | Disposition: A | Discharge status: Home / Self Care | Payer: 59 | Attending: Family Medicine | Admitting: Family Medicine

## 2014-11-06 DIAGNOSIS — G629 Polyneuropathy, unspecified: Secondary | ICD-10-CM | POA: Insufficient documentation

## 2014-11-06 DIAGNOSIS — D649 Anemia, unspecified: Secondary | ICD-10-CM | POA: Insufficient documentation

## 2014-11-06 DIAGNOSIS — E876 Hypokalemia: Secondary | ICD-10-CM | POA: Diagnosis not present

## 2014-11-06 DIAGNOSIS — R1110 Vomiting, unspecified: Secondary | ICD-10-CM | POA: Diagnosis present

## 2014-11-06 DIAGNOSIS — Z79899 Other long term (current) drug therapy: Secondary | ICD-10-CM | POA: Insufficient documentation

## 2014-11-06 DIAGNOSIS — F17210 Nicotine dependence, cigarettes, uncomplicated: Secondary | ICD-10-CM | POA: Insufficient documentation

## 2014-11-06 DIAGNOSIS — T6591XA Toxic effect of unspecified substance, accidental (unintentional), initial encounter: Secondary | ICD-10-CM | POA: Diagnosis present

## 2014-11-06 DIAGNOSIS — T5791XA Toxic effect of unspecified inorganic substance, accidental (unintentional), initial encounter: Secondary | ICD-10-CM

## 2014-11-06 DIAGNOSIS — R001 Bradycardia, unspecified: Secondary | ICD-10-CM | POA: Diagnosis not present

## 2014-11-06 DIAGNOSIS — T189XXA Foreign body of alimentary tract, part unspecified, initial encounter: Secondary | ICD-10-CM | POA: Diagnosis present

## 2014-11-06 LAB — CBC WITH DIFFERENTIAL/PLATELET
Basophils Absolute: 0 10*3/uL (ref 0.0–0.1)
Basophils Relative: 0 % (ref 0–1)
EOS PCT: 1 % (ref 0–5)
Eosinophils Absolute: 0.1 10*3/uL (ref 0.0–0.7)
HEMATOCRIT: 34.9 % — AB (ref 36.0–46.0)
Hemoglobin: 11.3 g/dL — ABNORMAL LOW (ref 12.0–15.0)
LYMPHS ABS: 3.2 10*3/uL (ref 0.7–4.0)
Lymphocytes Relative: 41 % (ref 12–46)
MCH: 22.4 pg — ABNORMAL LOW (ref 26.0–34.0)
MCHC: 32.4 g/dL (ref 30.0–36.0)
MCV: 69.1 fL — ABNORMAL LOW (ref 78.0–100.0)
Monocytes Absolute: 0.2 10*3/uL (ref 0.1–1.0)
Monocytes Relative: 2 % — ABNORMAL LOW (ref 3–12)
Neutro Abs: 4.2 10*3/uL (ref 1.7–7.7)
Neutrophils Relative %: 56 % (ref 43–77)
Platelets: 247 10*3/uL (ref 150–400)
RBC: 5.05 MIL/uL (ref 3.87–5.11)
RDW: 13.9 % (ref 11.5–15.5)
WBC: 7.7 10*3/uL (ref 4.0–10.5)

## 2014-11-06 LAB — I-STAT CG4 LACTIC ACID, ED: Lactic Acid, Venous: 2.87 mmol/L — ABNORMAL HIGH (ref 0.5–2.2)

## 2014-11-06 LAB — COMPREHENSIVE METABOLIC PANEL
ALT: 9 U/L (ref 0–35)
ANION GAP: 20 — AB (ref 5–15)
AST: 16 U/L (ref 0–37)
Albumin: 4.2 g/dL (ref 3.5–5.2)
Alkaline Phosphatase: 124 U/L — ABNORMAL HIGH (ref 39–117)
BILIRUBIN TOTAL: 0.5 mg/dL (ref 0.3–1.2)
BUN: 4 mg/dL — AB (ref 6–23)
CHLORIDE: 96 meq/L (ref 96–112)
CO2: 22 meq/L (ref 19–32)
CREATININE: 0.46 mg/dL — AB (ref 0.50–1.10)
Calcium: 9.5 mg/dL (ref 8.4–10.5)
GFR calc Af Amer: >90 mL/min (ref >90)
Glucose, Bld: 164 mg/dL — ABNORMAL HIGH (ref 70–99)
Potassium: 2.6 mEq/L — CL (ref 3.7–5.3)
Sodium: 138 mEq/L (ref 137–147)
Total Protein: 7.7 g/dL (ref 6.0–8.3)

## 2014-11-06 LAB — LACTIC ACID, PLASMA: Lactic Acid, Venous: 1.9 mmol/L (ref 0.5–2.2)

## 2014-11-06 LAB — TROPONIN I: Troponin I: <0.3 ng/mL (ref <0.30)

## 2014-11-06 LAB — MAGNESIUM: Magnesium: 1.6 mg/dL (ref 1.5–2.5)

## 2014-11-06 LAB — CK: CK TOTAL: 75 U/L (ref 7–177)

## 2014-11-06 MED ORDER — DEXTROSE-NACL 5-0.45 % IV SOLN
INTRAVENOUS | Status: DC
  Administered 2014-11-06 – 2014-11-07 (×2): via INTRAVENOUS

## 2014-11-06 MED ORDER — POTASSIUM CHLORIDE 10 MEQ/100ML IV SOLN
10.0000 meq | INTRAVENOUS | Status: AC
  Administered 2014-11-06 – 2014-11-07 (×2): 10 meq via INTRAVENOUS
  Filled 2014-11-06 (×3): qty 100

## 2014-11-06 MED ORDER — ONDANSETRON 8 MG/NS 50 ML IVPB
8.0000 mg | Freq: Once | INTRAVENOUS | Status: DC
  Filled 2014-11-06: qty 8

## 2014-11-06 MED ORDER — POTASSIUM CHLORIDE CRYS ER 20 MEQ PO TBCR
40.0000 meq | EXTENDED_RELEASE_TABLET | Freq: Once | ORAL | Status: AC
  Administered 2014-11-06: 40 meq via ORAL
  Filled 2014-11-06: qty 2

## 2014-11-06 MED ORDER — POTASSIUM CHLORIDE 10 MEQ/100ML IV SOLN
10.0000 meq | INTRAVENOUS | Status: DC
  Filled 2014-11-06 (×6): qty 100

## 2014-11-06 MED ORDER — POTASSIUM CHLORIDE 10 MEQ/100ML IV SOLN
10.0000 meq | Freq: Once | INTRAVENOUS | Status: AC
  Administered 2014-11-06: 10 meq via INTRAVENOUS
  Filled 2014-11-06: qty 100

## 2014-11-06 MED ORDER — ONDANSETRON HCL 4 MG/2ML IJ SOLN
4.0000 mg | Freq: Once | INTRAMUSCULAR | Status: AC
  Administered 2014-11-06: 4 mg via INTRAVENOUS
  Filled 2014-11-06: qty 2

## 2014-11-06 MED ORDER — SODIUM CHLORIDE 0.9 % IJ SOLN
3.0000 mL | Freq: Two times a day (BID) | INTRAMUSCULAR | Status: DC
  Administered 2014-11-06: 3 mL via INTRAVENOUS

## 2014-11-06 MED ORDER — PNEUMOCOCCAL VAC POLYVALENT 25 MCG/0.5ML IJ INJ
0.5000 mL | INJECTION | INTRAMUSCULAR | Status: AC
Start: 2014-11-07 — End: 2014-11-07
  Administered 2014-11-07: 0.5 mL via INTRAMUSCULAR
  Filled 2014-11-06: qty 0.5

## 2014-11-06 MED ORDER — LORAZEPAM 2 MG/ML IJ SOLN: 0.5000 mg | Freq: Once | INTRAMUSCULAR | Status: DC

## 2014-11-06 MED ORDER — HEPARIN SODIUM (PORCINE) 5000 UNIT/ML IJ SOLN
5000.0000 [IU] | Freq: Three times a day (TID) | INTRAMUSCULAR | Status: DC
  Administered 2014-11-06 – 2014-11-07 (×2): 5000 [IU] via SUBCUTANEOUS
  Filled 2014-11-06 (×4): qty 1

## 2014-11-06 MED ORDER — SODIUM CHLORIDE 0.9 % IV BOLUS (SEPSIS)
1000.0000 mL | Freq: Once | INTRAVENOUS | Status: AC
  Administered 2014-11-06: 1000 mL via INTRAVENOUS

## 2014-11-06 NOTE — ED Notes (Addendum)
Pt appearing very anxious and upset, states- per daughter- that she went to a neighbors house and ate a plant that the neighbor gave her. Pt not vomiting at this time but states she began vomiting right after she ate the plant. Also c/o pain in both hands when she tries to open them and a "funny" feeling in her face.

## 2014-11-06 NOTE — ED Notes (Signed)
MD at bedside. 

## 2014-11-06 NOTE — Progress Notes (Signed)
Received pt report from Chelsea,RN-ED. 

## 2014-11-06 NOTE — ED Notes (Signed)
EKG performed and showed to Dr. Radford PaxBeaton.

## 2014-11-06 NOTE — ED Notes (Signed)
Attempted to call report, nurse taking report, to call back to obtain report.

## 2014-11-06 NOTE — H&P (Signed)
[facility] and Physical Service Pager: [phone]  Patient name: [name] Medical record number: [id number] Date of birth: 1961-02-13 Age: 53 y.o. Gender: female  Primary Care Provider: Tawni CarnesWight, Andrew, MD Consultants: None Code Status: Full  Chief Complaint: Nausea & vomiting  Assessment and Plan: [name] [name] is a 53 y.o. female presenting with nausea, vomiting and abdominal pain after unknown plant ingestion.   No significant PMH  # Ingestion: Unknown plant. VSS after NS bolus. Poison control called and advised observation with cardiac monitoring. Lactic acid elevated 2.87. CK wnl. Cr 0.46 - Admit to telemetry - EKG in a.m., Check Trop - Recheck Lactic acid  # Vomiting: Due to ingestion of unknown plant - Holding antiemetics for now given QTc 515 and N/V resolved  - IVF as below; s/p 1L bolus  # Hypokalemia: K 2.6 on admit (12/16).  Likely due to vomiting versus ingestion. Mag wnl - IV potassium 10mEq x 3 + Kdur 40mEq x 1 - repeat CMET in am  # Anemia: Microcytic Hgb 11.3 (MCV 69) stable Hgb 11.1 (22mo ago) - Check CBC in am; FOBT, UA - Recommend outpatient evaluation if Hgb stable  FEN/GI: Reg diet; MIVF D5 1/2 NS @ 75 cc/hr Prophylaxis: Heparin subq  Disposition: Admit attending Dr. Mauricio PoBreen; dispose pending observation and  resolution electrolyte abnormalities   History of Present Illness: [name] [name] is a 53 y.o. female presenting with nausea, vomiting and abdominal pain after unknown plant ingestion. Additionally complained of numbness in upper extremities and face that resolved in ED.  Denies any other medical problems; does not take any medication. No current symptoms except mild HA.   ED evaluation:  - Hypokalemia: 2.6 - IV POTASSIUM GIVEN - Poison control consultation: Advised overnight telemetry monitoring   Review Of Systems: Per HPI with the following additions:  Otherwise 12 point review of systems was performed and was  unremarkable.  Patient Active Problem List   Diagnosis Date Noted  . Ingestion of foreign substance 11/06/2014  . Neuropathy of foot 12/10/2013  . Skin lesion 11/12/2011  . Corn of toe 10/18/2011  . TOBACCO DEPENDENCE 01/19/2007  . HEARING LOSS NOS OR DEAFNESS 01/19/2007  . MENOPAUSAL SYNDROME 01/19/2007   Past Medical History: History reviewed. No pertinent past medical history. Past Surgical History: Past Surgical History  Procedure Laterality Date  . Cesarean section      x2   . Abdominal hysterectomy     Social History: History  Substance Use Topics  . Smoking status: Current Every Day Smoker -- 0.50 packs/day    Types: Cigarettes  . Smokeless tobacco: Not on file  . Alcohol Use: No   Additional social history:   Please also refer to relevant sections of EMR.  Family History: History reviewed. No pertinent family history. Allergies and Medications: No Known Allergies No current facility-administered medications on file prior to encounter.   Current Outpatient Prescriptions on File Prior to Encounter  Medication Sig Dispense Refill  . gabapentin (NEURONTIN) 100 MG capsule Take 1 capsule (100 mg total) by mouth 3 (three) times daily. (Patient not taking: Reported on 11/06/2014) 90 capsule 3    Objective: BP 124/82 mmHg  Pulse 66  Temp(Src) 98.4 F (36.9 C) (Oral)  Resp 17  SpO2 100% Exam: General: NAD HEENT: MMM Cardiovascular: RRR no m/r/g Respiratory: CTAB, normal resp effort Abdomen: SNTND Extremities: WWP Skin: No Rash Neuro: A&Ox4; CN 2-12 intact; Gross Sensory & Motor intact   Labs and Imaging: CBC BMET  Recent Labs Lab 11/06/14 1545  WBC 7.7  HGB 11.3*  HCT 34.9*  PLT 247    Recent Labs Lab 11/06/14 1545  NA 138  K 2.6*  CL 96  CO2 22  BUN 4*  CREATININE 0.46*  GLUCOSE 164*  CALCIUM 9.5    Lactic Acid: 2.87 CK: wnl 75 Mg: wnl 1.6  EKG: NSR, QTc 515, Qwaves V1-V2; Twave inversion V1-V3  Jamal CollinJames R Dillinger Aston, MD 11/06/2014,  8:14 PM PGY-2, [facility] F[facility]S Intern pager: [phone], text pages welcome

## 2014-11-06 NOTE — ED Notes (Signed)
Attempted to call report, no answer on floor.

## 2014-11-06 NOTE — Progress Notes (Signed)
[name] [id number] Admission Data: 11/06/2014 9:04 PM Attending Provider: Barbaraann BarthelJames O Breen, MD WUJ:[id number]:Wight, Greig CastillaAndrew, MD Code Status: Full  [name] [name] is a 53 y.o. female patient admitted from ED:  -No acute distress noted.  -No complaints of shortness of breath.  -No complaints of chest pain.   Cardiac Monitoring: Box # 6 in place. Cardiac monitor yields:normal sinus rhythm.  Blood pressure 162/61, pulse 58, temperature 98.1 F (36.7 C), temperature source Oral, resp. rate 14, height 4\' 7"  (1.397 m), weight 35.154 kg (77 lb 8 oz), SpO2 99 %.   IV Fluids:  IV in place, occlusive dsg intact without redness, IV cath antecubital right, condition patent and no redness normal saline.   Allergies:  Review of patient's allergies indicates no known allergies.  Past Medical History:   has no past medical history on file.  Past Surgical History:   has past surgical history that includes Cesarean section and Abdominal hysterectomy.  Social History:   reports that she has been smoking Cigarettes.  She has been smoking about 0.50 packs per day. She does not have any smokeless tobacco history on file. She reports that she does not drink alcohol or use illicit drugs.  Skin: NSI  Patient/Family orientated to room. Information packet given to patient/family. Admission inpatient armband information verified with patient/family to include name and date of birth and placed on patient arm. Side rails up x 2, fall assessment and education completed with patient/family. Patient/family able to verbalize understanding of risk associated with falls and verbalized understanding to call for assistance before getting out of bed. Call light within reach. Patient/family able to voice and demonstrate understanding of unit orientation instructions.

## 2014-11-06 NOTE — ED Notes (Addendum)
Pt c/o nausea and vomiting; pt swinging extremities around and groaning; pt daughter speaks english; pt sts feels numb all over; per daughter sx just started  Prior to arrival

## 2014-11-06 NOTE — ED Notes (Signed)
NOTIFIED DR. BEATON FOR CG4+ LACTIC ACID =2.6787mmol/L @17 :13 PM ,11/06/2014.

## 2014-11-06 NOTE — ED Provider Notes (Signed)
CSN: [id number]     Arrival date & time 11/06/14  1516 History   First MD Initiated Contact with Patient 11/06/14 1523     Chief Complaint  Patient presents with  . Emesis     (Consider location/radiation/quality/duration/timing/severity/associated sxs/prior Treatment) HPI  53 year old female with no medical history presents shortly after eating a plan of unknown origin and having vomiting. Never has had this line before no other new ingestions no known medical problems. Approximately 10-15 minutes after eating a relief office plan started having abdominal pain or vomiting and then also started having paresthesias and cramping in her hands that has gotten worse and spread up her arm since that time. Is afebrile and in perfect health until this started this afternoon. No rashes recent travels or other ingestions. No history of the same.   No past medical history on file. Past Surgical History  Procedure Laterality Date  . Cesarean section      x2   . Abdominal hysterectomy     No family history on file. History  Substance Use Topics  . Smoking status: Current Every Day Smoker -- 0.50 packs/day    Types: Cigarettes  . Smokeless tobacco: Not on file  . Alcohol Use: No   OB History    No data available     Review of Systems  Constitutional: Negative for fever and fatigue.  Cardiovascular: Negative for chest pain.  Gastrointestinal: Positive for nausea, vomiting and abdominal pain.  Endocrine: Negative for polydipsia and polyuria.  Skin: Negative for pallor and rash.  Neurological: Positive for weakness and numbness.  All other systems reviewed and are negative.     Allergies  Review of patient's allergies indicates no known allergies.  Home Medications   Prior to Admission medications   Medication Sig Start Date End Date Taking? Authorizing Provider  gabapentin (NEURONTIN) 100 MG capsule Take 1 capsule (100 mg total) by mouth 3 (three) times daily. 01/01/14   Nani RavensAndrew M  Wight, MD   BP 142/93 mmHg  Pulse 100  Temp(Src) 97.9 F (36.6 C) (Oral)  Resp 28  SpO2 100% Physical Exam  Constitutional: She is oriented to person, place, and time. She appears well-developed and well-nourished.  HENT:  Head: Normocephalic and atraumatic.  Eyes: Conjunctivae and EOM are normal. Right eye exhibits no discharge. Left eye exhibits no discharge.  Cardiovascular: Regular rhythm.  Tachycardia present.   Pulmonary/Chest: Effort normal and breath sounds normal. Tachypnea noted. No respiratory distress.  Abdominal: Soft. She exhibits no distension. There is no tenderness. There is no rebound.  Musculoskeletal: Normal range of motion. She exhibits no edema or tenderness.  Neurological: She is alert and oriented to person, place, and time.  Decreased strength bilateral upper extremities. 1+ DTRs in the biceps symmetrically. Decreased sensation but can still feel light touch.  Skin: Skin is warm and dry.  Nursing note and vitals reviewed.   ED Course  Procedures (including critical care time) Labs Review Labs Reviewed - No data to display  Imaging Review No results found.   EKG Interpretation None      MDM   Final diagnoses:  Ingestion of substance, initial encounter    Patient's symptoms improved in the emergency department with supportive care. Poison control was consulted and recommended overnight observation. After speaking with them they spoke with the botanist I also spoke with local greenhouse and nursery staff and noone can identify the plant. Patient's labs revealed hypokalemia for which potassium was given in the emergency department and  she'll continue to get some as an inpatient. Normal EKG normal sinus rhythm with slightly prolonged QT. Also had slightly elevated lactic acid but it should improve with IVF.     Marily MemosJason Simar Pothier, MD 11/06/14 [id number]  Nelia Shiobert L Beaton, MD 11/06/14 [phone]

## 2014-11-07 DIAGNOSIS — T5791XA Toxic effect of unspecified inorganic substance, accidental (unintentional), initial encounter: Secondary | ICD-10-CM

## 2014-11-07 LAB — CBC
HEMATOCRIT: 31.3 % — AB (ref 36.0–46.0)
HEMOGLOBIN: 9.7 g/dL — AB (ref 12.0–15.0)
MCH: 22.2 pg — AB (ref 26.0–34.0)
MCHC: 31 g/dL (ref 30.0–36.0)
MCV: 71.6 fL — AB (ref 78.0–100.0)
Platelets: 204 10*3/uL (ref 150–400)
RBC: 4.37 MIL/uL (ref 3.87–5.11)
RDW: 13.9 % (ref 11.5–15.5)
WBC: 7.2 10*3/uL (ref 4.0–10.5)

## 2014-11-07 LAB — URINALYSIS, ROUTINE W REFLEX MICROSCOPIC
Bilirubin Urine: NEGATIVE
GLUCOSE, UA: NEGATIVE mg/dL
Hgb urine dipstick: NEGATIVE
KETONES UR: NEGATIVE mg/dL
Leukocytes, UA: NEGATIVE
Nitrite: NEGATIVE
PROTEIN: NEGATIVE mg/dL
Specific Gravity, Urine: 1.007 (ref 1.005–1.030)
Urobilinogen, UA: 0.2 mg/dL (ref 0.0–1.0)
pH: 7.5 (ref 5.0–8.0)

## 2014-11-07 LAB — COMPREHENSIVE METABOLIC PANEL
ALBUMIN: 3.4 g/dL — AB (ref 3.5–5.2)
ALT: 8 U/L (ref 0–35)
AST: 14 U/L (ref 0–37)
Alkaline Phosphatase: 103 U/L (ref 39–117)
Anion gap: 8 (ref 5–15)
BUN: 5 mg/dL — ABNORMAL LOW (ref 6–23)
CALCIUM: 9.1 mg/dL (ref 8.4–10.5)
CO2: 27 mEq/L (ref 19–32)
CREATININE: 0.52 mg/dL (ref 0.50–1.10)
Chloride: 107 mEq/L (ref 96–112)
GFR calc Af Amer: >90 mL/min (ref >90)
GFR calc non Af Amer: >90 mL/min (ref >90)
Glucose, Bld: 82 mg/dL (ref 70–99)
Potassium: 4.5 mEq/L (ref 3.7–5.3)
SODIUM: 142 meq/L (ref 137–147)
TOTAL PROTEIN: 6.4 g/dL (ref 6.0–8.3)
Total Bilirubin: 0.5 mg/dL (ref 0.3–1.2)

## 2014-11-07 NOTE — Progress Notes (Signed)
Family Medicine Teaching Service Daily Progress Note Intern Pager: [phone]  Patient name: [name] [name] Medical record number: [id number] Date of birth: [birth year] Age: 53 y.o. Gender: female  Primary Care Provider: Tawni CarnesWight, Andrew, MD Consultants: none Code Status: FULL  Pt Overview and Major Events to Date:  none  Assessment and Plan:  [name] [name] is a 53 y.o. female presenting with nausea, vomiting and abdominal pain after unknown plant ingestion. No significant PMH  # Ingestion: Unknown plant. VSS after NS bolus. Poison control called and advised observation with cardiac monitoring. Lactic acid elevated 2.87>>1.9. CK wnl. Cr 0.46>>0.52. Trop neg x1.  EKG with possible old septal infarct. QTc improved to 441 - Monitor on telemetry  # Bradycardia: On admission HR 70-80's.  HR this am: 59-64 (baseline 60-70's) - Continue to monitor  # Vomiting: Due to ingestion of unknown plant.  - N/V resolved  - IVF as below; s/p 1L bolus  # Hypokalemia: K 2.6 on admit (12/16). Likely due to vomiting versus ingestion. Mag wnl Repeat K: 4.5 - IV potassium 10mEq x 3 + Kdur 40mEq x 1  # Anemia: Microcytic Hgb 11.3 (MCV 69) stable Hgb 11.1 (57mo ago). UA nml. Hgb: 9.7. Likely dilutional - FOBT - Recommend outpatient evaluation if Hgb stable  FEN/GI: Reg diet; MIVF D5 1/2 NS @ 75 cc/hr Prophylaxis: Heparin subq  Disposition: Will continue to monitor through the afternoon.  Likely discharge today.  Subjective:  Interpretation is provided by daughter. Patient reports that she is feeling better.  She denies n/v, diarrhea, dizziness, abdominal pain, CP, SOB, changes in vision.  Patient only voices that the needle sticks are painful and she is eager to go home.  Objective: Temp:  [97.9 F (36.6 C)-98.6 F (37 C)] 98.6 F (37 C) (12/17 0533) Pulse Rate:  [58-100] 59 (12/17 0533) Resp:  [12-28] 16 (12/17 0533) BP: (120-162)/(61-98) 152/85 mmHg (12/17 0533) SpO2:  [97 %-100 %] 100 % (12/17  0533) Weight:  [77 lb 8 oz (35.154 kg)] 77 lb 8 oz (35.154 kg) (12/16 2018) Physical Exam: General: awake, alert, well appearing, petite female, NAD, family at bedside, RN at bedside Cardiovascular: S1S2, RRR, no m/r/g, brisk cap refill.  HR 68 on telemetry. Respiratory: CTAB, no wheeze, no increased WOB Abdomen: flat, soft, NT/ND, +BS Extremities: WWP, no cyanosis, clubbing or edema, PIV in place  Laboratory:  Recent Labs Lab 11/06/14 1545 11/07/14 0616  WBC 7.7 7.2  HGB 11.3* 9.7*  HCT 34.9* 31.3*  PLT 247 204    Recent Labs Lab 11/06/14 1545 11/07/14 0616  NA 138 142  K 2.6* 4.5  CL 96 107  CO2 22 27  BUN 4* 5*  CREATININE 0.46* 0.52  CALCIUM 9.5 9.1  PROT 7.7 6.4  BILITOT 0.5 0.5  ALKPHOS 124* 103  ALT 9 8  AST 16 14  GLUCOSE 164* 82    Imaging/Diagnostic Tests: No results found.   Raliegh IpAshly M Annalynn Centanni, DO 11/07/2014, 9:36 AM PGY-1, [facility] FPTS Intern pager: [phone], text pages welcome

## 2014-11-07 NOTE — Discharge Summary (Signed)
Family Medicine [facility] Discharge Summary  Patient name: [name] [name] Medical record number: [id number] Date of birth: 27-Apr-1961 Age: 53 y.o. Gender: female Date of Admission: 11/06/2014  Date of Discharge: 11/07/14 Admitting Physician: Barbaraann BarthelJames O Breen, MD  Primary Care Provider: Tawni CarnesWight, Andrew, MD Consultants: none  Indication for Hospitalization: ingestion of an unknown plant resulting in N/V, tremors  Discharge Diagnoses/Problem List:  Ingestion of a substance  Disposition: Discharge home  Discharge Condition: Stable  Discharge Exam:  BP 130/78 mmHg  Pulse 63  Temp(Src) 98.7 F (37.1 C) (Oral)  Resp 15  Ht 4\' 7"  (1.397 m)  Wt 77 lb 8 oz (35.154 kg)  BMI 18.01 kg/m2  SpO2 100%   General: awake, alert, well appearing, petite female, NAD, family at bedside, RN at bedside Cardiovascular: S1S2, RRR, no m/r/g, brisk cap refill. HR 68 on telemetry. Respiratory: CTAB, no wheeze, no increased WOB Abdomen: flat, soft, NT/ND, +BS Extremities: WWP, no cyanosis, clubbing or edema MSK: normal strength and tone Neuro: no focal deficits, patient able to ambulate around room safely  Brief Hospital Course:  [name] [name] is a 53 y.o. female that presented with nausea, vomiting and abdominal pain after unknown plant ingestion. Additionally she complained of numbness in upper extremities and face that resolved in ED.  Poison control was contacted but because plant could not be identified no recommendations besides observation were given.  Patient was hypokalemic to 2.6 in ED and IV Potassium was administered.  Patient also received an additional oral dose of K.  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  On exam, patient was breathing well on RA, she was AAOx4, and had no focal neurologic deficits.   [facility].  Lactic acid was obtained and was elevated to 2.87.  This was trended and was 1.9 by the following day.  CK was WNL.  Cr 0.46 on  admission.  This also normalized to 0.52 on day of discharge.  Patient was monitored on telemetry.  She was hydrated with IVFs.  EKG was check which revealed an old infarct and resolved QTc prolongation. Troponin was monitored and was negative.  Repeat BMET showed improved K to 4.5.  CBC was checked and revealed a hgb 9.7.  Patient was asymptomatic.  However, close follow up of hgb is recommended outpatient.  Patient was noted to be slightly bradycardic (50-60's) the morning of discharge.  She was observed into the afternoon and HR improved to baseline (60-70's).  Patient's symptoms completely resolved.  She was discharged home in stable condition with close follow up with Family Medicine.    Issues for Follow Up:   Please have PCP follow up on anemia.    Recommend repeat CBC for hgb at hospital follow-up appointment.  Significant Procedures: none  Significant Labs and Imaging:   Recent Labs Lab 11/06/14 1545 11/07/14 0616  WBC 7.7 7.2  HGB 11.3* 9.7*  HCT 34.9* 31.3*  PLT 247 204    Recent Labs Lab 11/06/14 1545 11/06/14 1647 11/07/14 0616  NA 138  --  142  K 2.6*  --  4.5  CL 96  --  107  CO2 22  --  27  GLUCOSE 164*  --  82  BUN 4*  --  5*  CREATININE 0.46*  --  0.52  CALCIUM 9.5  --  9.1  MG  --  1.6  --   ALKPHOS 124*  --  103  AST 16  --  14  ALT 9  --  8  ALBUMIN 4.2  --  3.4*   Cardiac Panel (last 3 results)  Recent Labs  11/06/14 1545 11/06/14 2114  CKTOTAL 75  --   TROPONINI  --  <0.30   Lactic acid: 1.9  Urinalysis    Component Value Date/Time   COLORURINE YELLOW 11/07/2014 0145   APPEARANCEUR CLEAR 11/07/2014 0145   LABSPEC 1.007 11/07/2014 0145   PHURINE 7.5 11/07/2014 0145   GLUCOSEU NEGATIVE 11/07/2014 0145   HGBUR NEGATIVE 11/07/2014 0145   BILIRUBINUR NEGATIVE 11/07/2014 0145   KETONESUR NEGATIVE 11/07/2014 0145   PROTEINUR NEGATIVE 11/07/2014 0145   UROBILINOGEN 0.2 11/07/2014 0145   NITRITE NEGATIVE 11/07/2014 0145   LEUKOCYTESUR  NEGATIVE 11/07/2014 0145     Results/Tests Pending at Time of Discharge: none  Discharge Medications:    Medication List    TAKE these medications        acetaminophen 500 MG tablet  Commonly known as:  TYLENOL  Take 1,000 mg by mouth every 6 (six) hours as needed for mild pain.     gabapentin 100 MG capsule  Commonly known as:  NEURONTIN  Take 1 capsule (100 mg total) by mouth 3 (three) times daily.        Discharge Instructions: Please refer to Patient Instructions section of EMR for full details.  Patient was counseled important signs and symptoms that should prompt return to medical care, changes in medications, dietary instructions, activity restrictions, and follow up appointments.   Follow-Up Appointments:     Follow-up Information    Follow up with Melancon, Hillery Hunteraleb G, MD. Go on 11/11/2014.   Specialty:  Family Medicine   Why:  1:30 pm (hospital follow up)   Contact information:   [address] [place] [address] [phone]       Raliegh Ipshly M Kaidon Kinker, DO 11/07/2014, 2:46 PM PGY-1, [facility]

## 2014-11-07 NOTE — Progress Notes (Signed)
Nsg Discharge Note  Admit Date:  11/06/2014 Discharge date: 11/07/2014   Kalina Muriel to be D/C'd Home per MD order.  AVS completed.  Copy for chart, and copy for patient signed, and dated. Patient/caregiver able to verbalize understanding.  Discharge Medication:   Medication List    TAKE these medications        acetaminophen 500 MG tablet  Commonly known as:  TYLENOL  Take 1,000 mg by mouth every 6 (six) hours as needed for mild pain.     gabapentin 100 MG capsule  Commonly known as:  NEURONTIN  Take 1 capsule (100 mg total) by mouth 3 (three) times daily.        Discharge Assessment: Filed Vitals:   11/07/14 1414  BP: 130/78  Pulse: 63  Temp: 98.7 F (37.1 C)  Resp: 15   Skin clean, dry and intact without evidence of skin break down, no evidence of skin tears noted. IV catheter discontinued intact. Site without signs and symptoms of complications - no redness or edema noted at insertion site, patient denies c/o pain - only slight tenderness at site.  Dressing with slight pressure applied.  D/c Instructions-Education: Discharge instructions given to patient/family with verbalized understanding. D/c education completed with patient/family including follow up instructions, medication list, d/c activities limitations if indicated, with other d/c instructions as indicated by MD - patient able to verbalize understanding, all questions fully answered. Patient instructed to return to ED, call 911, or call MD for any changes in condition.  Patient escorted via WC, and D/C home via private auto.  Floetta Brickey Consuella Loselaine, RN 11/07/2014 5:01 PM

## 2014-11-07 NOTE — Discharge Instructions (Signed)
We are so glad to see that you are doing better.  You were admitted after ingesting a plant of unknown name.  You were observed for low heart rate and nausea with vomiting.  [facility] @1 :30pm.  Please arrive 15 minutes early for your visit.  If you feel lightheaded, nauseous, have blurry vision or have trouble breathing please seek immediate medical attention.   Poisoning Information Poisoning is illness caused by eating, drinking, touching, or inhaling a harmful substance. The damaging effects on the person's health will vary depending on the type of poison, the amount of exposure, and the duration of exposure before treatment. These effects may range from mild to very severe or even fatal.  Most poisonings take place in the home and involve common household products. They can also occur in the workplace, especially in industrial or manufacturing facilities. Poisoning is more common in children than adults. However, poisoning often causes more serious illness in adults. Poisonings are often accidental, but there are also many cases in which a person intentionally ingests poison. WHAT THINGS MAY BE POISONOUS?  A poison can be any substance that causes illness or harm to the body. Poisoning is often caused by products that are commonly found in homes. Many substances can become poisonous if used in ways or amounts that are not appropriate. Some common products that can cause poisoning are:   Medicines, including prescription medicines, over-the-counter pain medicines, vitamins, iron pills, and herbal supplements.  Cleaning or laundry products.  Paint and paint thinner.  Weed or insect killers.  Perfume, hair spray, or nail products.  Alcohol.  Plants, such as philodendron, poinsettia, oleander, castor bean, cactus, and tomato plants.  Batteries.  Furniture polish.  Drain cleaners.  Antifreeze or other automotive  products.  Gasoline, lighter fluid, or lamp oil.  Carbon monoxide gas from furnaces or automobiles.  Toxic fumes from the burning of plastics or certain other materials. WHAT ARE SOME FIRST-AID MEASURES FOR POISONING? The local poison control center must be contacted whenever a person may have been exposed to poison. The poison control specialist will often give a set of directions to follow over the phone. These directions may include the following:  Remove any substance that is still in the mouth if the poison was not food or medicine. Drink a small amount of water.  Keep the medicine container if too much medicine or the wrong medicine was swallowed. Use it to identify the medicine to the poison control specialist.  Get away from the area where exposure occurred as soon as possible if the poison was from fumes or chemicals.  Get fresh air as soon as possible if a poison was inhaled.  Remove any affected clothing and rinse the skin with water if a poison got on the skin.  Rinse the eyes with water if a poison or chemical got in the eyes.  Begin cardiopulmonary resuscitation (CPR) if breathing stops. HOW CAN YOU PREVENT POISONING? Take these steps to help prevent poisoning:  Keep medicines and chemical products in their original containers. Many of these come in child-safe packaging. Store them in areas out of reach of children.  Educate othersabout the dangers of possible poisons.  Read labels before using medicine or household products. Leave the original labels on the containers.  Always turn on a light when taking medicine. Check the dosage every time.   Close the containers tightly after using medicine or chemical  products.  Get rid of unneeded and outdated medicines by following the specific disposal instructions on the medicine label or the patient information that came with the medicine. Do not put medicine in the trash or flush it down the toilet. Use the community's  drug take-back program to dispose of medicine. If these options are not available, take the medicine out of the original container and mix it with an undesirable substance, such as coffee grounds or kitty litter. Seal the mixture in a sealable bag, can, or other container and throw it away.  Keep all dangerous household products (such as lighter fluid, paint thinner and remover, gasoline, and antifreeze) in locked cabinets.  Do not mix different household chemicals with each other.  Use protective equipment (gloves, goggles, masks, aprons) as needed when using chemicals or cleaners.  Install a carbon monoxide detector in your home. WHEN SHOULD YOU SEEK HELP?  Contact the poison control center wheneveryou suspect that a person has been exposed to poison. Call [phone] (in the [place] to reach a poison center for your area. If you are outside the [place] ask your health care provider what the phone number is for your local poison control center. Keep the phone number posted near your phone. Make sure everyone in your household knows where to find the number. The local emergency services (911 in [place] must be contacted if a person has been exposed to poison and:   Has trouble breathing or stops breathing.  Develops chest pain.  Has trouble staying awake or becomes unconscious.  Has a seizure.  Has severe vomiting or bleeding.  Has a worsening headache.  Has a decreased level of alertness.  Develops a widespread rash that may or may not be painful.  Has changes in vision.  Has difficulty swallowing.  Develops severe abdominal pain. FOR MORE INFORMATION  American Association of Poison Control Centers: www.aapcc.org Document Released: 10/25/2012 Document Revised: 03/25/2014 Document Reviewed: 10/25/2012 [facility] Patient Information 2015 [place]. This information is not intended to replace advice given to you by your health care provider. Make sure you discuss any questions  you have with your health care provider.

## 2014-11-07 NOTE — Progress Notes (Signed)
UR completed 

## 2014-11-11 ENCOUNTER — Ambulatory Visit: Payer: 59 | Admitting: Family Medicine

## 2014-12-19 ENCOUNTER — Ambulatory Visit (INDEPENDENT_AMBULATORY_CARE_PROVIDER_SITE_OTHER): Payer: 59 | Admitting: Family Medicine

## 2014-12-19 VITALS — BP 131/78 | HR 67 | Temp 98.3°F | Resp 16 | Wt 79.0 lb

## 2014-12-19 DIAGNOSIS — R10819 Abdominal tenderness, unspecified site: Secondary | ICD-10-CM

## 2014-12-19 DIAGNOSIS — Z1211 Encounter for screening for malignant neoplasm of colon: Secondary | ICD-10-CM | POA: Insufficient documentation

## 2014-12-19 DIAGNOSIS — K5900 Constipation, unspecified: Secondary | ICD-10-CM | POA: Insufficient documentation

## 2014-12-19 LAB — POCT URINALYSIS DIPSTICK
Bilirubin, UA: NEGATIVE
Blood, UA: NEGATIVE
GLUCOSE UA: NEGATIVE
KETONES UA: NEGATIVE
LEUKOCYTES UA: NEGATIVE
NITRITE UA: NEGATIVE
PROTEIN UA: NEGATIVE
Urobilinogen, UA: 0.2
pH, UA: 6.5

## 2014-12-19 MED ORDER — POLYETHYLENE GLYCOL 3350 17 GM/SCOOP PO POWD: 34.0000 g | Freq: Two times a day (BID) | ORAL | Status: DC

## 2014-12-19 MED ORDER — DOCUSATE SODIUM 50 MG PO CAPS: 100.0000 mg | ORAL_CAPSULE | Freq: Every day | ORAL | Status: DC

## 2014-12-19 NOTE — Patient Instructions (Signed)
Take miralax 2 caps twice a day until normal BMs then lower the dose to once per day Take colace once daily Drink plenty of water and LESS SODA Call for your colonoscopy appointment.   I'll see you in the hospital! - Dr. Jarvis NewcomerGrunz  Your PCP is Dr. Tawni CarnesAndrew Wight.

## 2014-12-19 NOTE — Assessment & Plan Note (Signed)
-   Miralax and colace - Improve hydration - Screening colonoscopy

## 2014-12-19 NOTE — Progress Notes (Signed)
Subjective: Aalaysia Marga HootsSiu is a 54 y.o. [name] presenting with her daughter for constipation.  Daughter serves as Engineer, technical salesinterpretor as the patient declines phone interpretor.   Constipation x1 week with a single BM yesterday which was hard. [facility], though has urge. Associated with abdominal pain in the lower midline and LLQ that is moderate, taken nothing for it, nonradiating, constant.   No fever, blood in stool, changes in urinary habits, fever, weight loss, nausea, vomiting.  - Had c-section remotely - Has not had colonoscopy - Review of Systems: Per HPI.  - Smoking status noted  Objective: BP 131/78 mmHg  Pulse 67  Temp(Src) 98.3 F (36.8 C) (Oral)  Resp 16  Wt 79 lb (35.834 kg)  SpO2 100% Gen:  54 y.o. female in no distress GI: Scaphoid; Normoactive BS; soft, +suprapubic tenderness, mild LLQ pain without rebound or guarding, non-distended, no organomegaly, no hernia appreciated Skin: Vertical c-section scar  Assessment/Plan: Rashauna Marga HootsSiu is a 54 y.o. female here for constipation.  - Miralax and colace - Improve hydration - Screening colonoscopy

## 2014-12-20 ENCOUNTER — Encounter: Payer: Self-pay | Admitting: Gastroenterology

## 2015-01-21 HISTORY — PX: COLOSTOMY: SHX63

## 2015-02-10 ENCOUNTER — Ambulatory Visit (AMBULATORY_SURGERY_CENTER): Payer: Self-pay

## 2015-02-10 VITALS — Ht <= 58 in | Wt 77.0 lb

## 2015-02-10 DIAGNOSIS — Z1211 Encounter for screening for malignant neoplasm of colon: Secondary | ICD-10-CM

## 2015-02-10 MED ORDER — SUPREP BOWEL PREP KIT 17.5-3.13-1.6 GM/177ML PO SOLN: 1.0000 | Freq: Once | ORAL | Status: DC

## 2015-02-10 NOTE — Progress Notes (Signed)
No allergies to eggs or soy No diet/weight loss meds No home oxygen No past problems with anesthesia  No email 

## 2015-02-20 ENCOUNTER — Ambulatory Visit (AMBULATORY_SURGERY_CENTER): Payer: 59 | Admitting: Gastroenterology

## 2015-02-20 ENCOUNTER — Encounter (HOSPITAL_COMMUNITY): Admission: EM | Disposition: A | Discharge status: Home Health | Payer: Self-pay | Source: Home / Self Care

## 2015-02-20 ENCOUNTER — Inpatient Hospital Stay (HOSPITAL_COMMUNITY)
Admission: EM | Admit: 2015-02-20 | Discharge: 2015-02-27 | DRG: 329 | Disposition: A | Discharge status: Home Health | Payer: 59 | Attending: General Surgery | Admitting: General Surgery

## 2015-02-20 ENCOUNTER — Encounter: Payer: Self-pay | Admitting: Gastroenterology

## 2015-02-20 ENCOUNTER — Encounter (HOSPITAL_COMMUNITY): Payer: Self-pay | Admitting: Emergency Medicine

## 2015-02-20 ENCOUNTER — Emergency Department (HOSPITAL_COMMUNITY): Payer: 59 | Admitting: Anesthesiology

## 2015-02-20 VITALS — BP 123/89 | HR 72 | Temp 97.4°F | Resp 37 | Ht <= 58 in | Wt 77.0 lb

## 2015-02-20 DIAGNOSIS — D649 Anemia, unspecified: Secondary | ICD-10-CM | POA: Diagnosis not present

## 2015-02-20 DIAGNOSIS — K5900 Constipation, unspecified: Secondary | ICD-10-CM | POA: Diagnosis present

## 2015-02-20 DIAGNOSIS — F17210 Nicotine dependence, cigarettes, uncomplicated: Secondary | ICD-10-CM | POA: Diagnosis present

## 2015-02-20 DIAGNOSIS — K631 Perforation of intestine (nontraumatic): Secondary | ICD-10-CM | POA: Diagnosis present

## 2015-02-20 DIAGNOSIS — R05 Cough: Secondary | ICD-10-CM

## 2015-02-20 DIAGNOSIS — Z1211 Encounter for screening for malignant neoplasm of colon: Secondary | ICD-10-CM | POA: Diagnosis present

## 2015-02-20 DIAGNOSIS — R1032 Left lower quadrant pain: Secondary | ICD-10-CM

## 2015-02-20 DIAGNOSIS — K659 Peritonitis, unspecified: Secondary | ICD-10-CM | POA: Diagnosis present

## 2015-02-20 DIAGNOSIS — R059 Cough, unspecified: Secondary | ICD-10-CM

## 2015-02-20 DIAGNOSIS — R109 Unspecified abdominal pain: Secondary | ICD-10-CM

## 2015-02-20 HISTORY — PX: LAPAROTOMY: SHX154

## 2015-02-20 LAB — CBC WITH DIFFERENTIAL/PLATELET
Basophils Absolute: 0 10*3/uL (ref 0.0–0.1)
Basophils Relative: 0 % (ref 0–1)
EOS PCT: 1 % (ref 0–5)
Eosinophils Absolute: 0.1 10*3/uL (ref 0.0–0.7)
HCT: 40.1 % (ref 36.0–46.0)
Hemoglobin: 13 g/dL (ref 12.0–15.0)
Lymphocytes Relative: 35 % (ref 12–46)
Lymphs Abs: 1.8 10*3/uL (ref 0.7–4.0)
MCH: 22.8 pg — AB (ref 26.0–34.0)
MCHC: 32.4 g/dL (ref 30.0–36.0)
MCV: 70.4 fL — ABNORMAL LOW (ref 78.0–100.0)
MONO ABS: 0.2 10*3/uL (ref 0.1–1.0)
MONOS PCT: 3 % (ref 3–12)
NEUTROS PCT: 61 % (ref 43–77)
Neutro Abs: 3.1 10*3/uL (ref 1.7–7.7)
PLATELETS: 189 10*3/uL (ref 150–400)
RBC: 5.7 MIL/uL — ABNORMAL HIGH (ref 3.87–5.11)
RDW: 14.4 % (ref 11.5–15.5)
WBC: 5.2 10*3/uL (ref 4.0–10.5)

## 2015-02-20 LAB — TYPE AND SCREEN
ABO/RH(D): A POS
Antibody Screen: NEGATIVE

## 2015-02-20 LAB — COMPREHENSIVE METABOLIC PANEL
ALT: 12 U/L (ref 0–35)
AST: 15 U/L (ref 0–37)
Albumin: 4.7 g/dL (ref 3.5–5.2)
Alkaline Phosphatase: 107 U/L (ref 39–117)
Anion gap: 7 (ref 5–15)
BILIRUBIN TOTAL: 1.2 mg/dL (ref 0.3–1.2)
BUN: 11 mg/dL (ref 6–23)
CALCIUM: 9.4 mg/dL (ref 8.4–10.5)
CHLORIDE: 108 mmol/L (ref 96–112)
CO2: 25 mmol/L (ref 19–32)
Creatinine, Ser: 0.57 mg/dL (ref 0.50–1.10)
GFR calc Af Amer: >90 mL/min (ref >90)
Glucose, Bld: 70 mg/dL (ref 70–99)
Potassium: 4.4 mmol/L (ref 3.5–5.1)
SODIUM: 140 mmol/L (ref 135–145)
Total Protein: 7.6 g/dL (ref 6.0–8.3)

## 2015-02-20 SURGERY — LAPAROTOMY, EXPLORATORY
Anesthesia: General | Site: Abdomen

## 2015-02-20 MED ORDER — ONDANSETRON HCL 4 MG/2ML IJ SOLN: 4.0000 mg | Freq: Once | INTRAMUSCULAR | Status: DC | PRN

## 2015-02-20 MED ORDER — HYDROMORPHONE HCL 1 MG/ML IJ SOLN
0.2500 mg | INTRAMUSCULAR | Status: DC | PRN
  Administered 2015-02-20 (×3): 0.25 mg via INTRAVENOUS

## 2015-02-20 MED ORDER — SUGAMMADEX SODIUM 200 MG/2ML IV SOLN
INTRAVENOUS | Status: DC | PRN
  Administered 2015-02-20: .7 mg via INTRAVENOUS

## 2015-02-20 MED ORDER — ENOXAPARIN SODIUM 30 MG/0.3ML ~~LOC~~ SOLN
30.0000 mg | SUBCUTANEOUS | Status: DC
  Administered 2015-02-21 – 2015-02-26 (×6): 30 mg via SUBCUTANEOUS
  Filled 2015-02-20 (×7): qty 0.3

## 2015-02-20 MED ORDER — FENTANYL CITRATE 0.05 MG/ML IJ SOLN
INTRAMUSCULAR | Status: AC
  Filled 2015-02-20: qty 5

## 2015-02-20 MED ORDER — SODIUM CHLORIDE 0.9 % IV SOLN: Freq: Once | INTRAVENOUS | Status: DC

## 2015-02-20 MED ORDER — BUPIVACAINE-EPINEPHRINE (PF) 0.5% -1:200000 IJ SOLN
INTRAMUSCULAR | Status: AC
  Filled 2015-02-20: qty 30

## 2015-02-20 MED ORDER — 0.9 % SODIUM CHLORIDE (POUR BTL) OPTIME
TOPICAL | Status: DC | PRN
  Administered 2015-02-20: 4000 mL

## 2015-02-20 MED ORDER — ONDANSETRON HCL 4 MG/2ML IJ SOLN
INTRAMUSCULAR | Status: DC | PRN
  Administered 2015-02-20: 4 mg via INTRAVENOUS

## 2015-02-20 MED ORDER — FENTANYL CITRATE 0.05 MG/ML IJ SOLN: 25.0000 ug | INTRAMUSCULAR | Status: DC | PRN

## 2015-02-20 MED ORDER — MIDAZOLAM HCL 5 MG/5ML IJ SOLN
INTRAMUSCULAR | Status: DC | PRN
  Administered 2015-02-20: 2 mg via INTRAVENOUS

## 2015-02-20 MED ORDER — ACETAMINOPHEN 500 MG PO TABS: 1000.0000 mg | ORAL_TABLET | Freq: Four times a day (QID) | ORAL | Status: DC

## 2015-02-20 MED ORDER — ENOXAPARIN SODIUM 40 MG/0.4ML ~~LOC~~ SOLN: 40.0000 mg | SUBCUTANEOUS | Status: DC

## 2015-02-20 MED ORDER — CETYLPYRIDINIUM CHLORIDE 0.05 % MT LIQD
7.0000 mL | Freq: Two times a day (BID) | OROMUCOSAL | Status: DC
  Administered 2015-02-21 – 2015-02-26 (×9): 7 mL via OROMUCOSAL

## 2015-02-20 MED ORDER — SUGAMMADEX SODIUM 200 MG/2ML IV SOLN
2.0000 mg/kg | Freq: Once | INTRAVENOUS | Status: DC
  Filled 2015-02-20: qty 0.7

## 2015-02-20 MED ORDER — FENTANYL CITRATE 0.05 MG/ML IJ SOLN
25.0000 ug | Freq: Once | INTRAMUSCULAR | Status: AC
  Administered 2015-02-20: 25 ug via INTRAVENOUS
  Filled 2015-02-20: qty 2

## 2015-02-20 MED ORDER — PROPOFOL 10 MG/ML IV BOLUS
INTRAVENOUS | Status: DC | PRN
  Administered 2015-02-20: 80 mg via INTRAVENOUS

## 2015-02-20 MED ORDER — MIDAZOLAM HCL 2 MG/2ML IJ SOLN
INTRAMUSCULAR | Status: AC
  Filled 2015-02-20: qty 2

## 2015-02-20 MED ORDER — ROCURONIUM BROMIDE 100 MG/10ML IV SOLN
INTRAVENOUS | Status: DC | PRN
  Administered 2015-02-20: 30 mg via INTRAVENOUS
  Administered 2015-02-20: 20 mg via INTRAVENOUS
  Administered 2015-02-20: 10 mg via INTRAVENOUS

## 2015-02-20 MED ORDER — FENTANYL CITRATE 0.05 MG/ML IJ SOLN
INTRAMUSCULAR | Status: DC | PRN
  Administered 2015-02-20 (×4): 50 ug via INTRAVENOUS

## 2015-02-20 MED ORDER — SODIUM CHLORIDE 0.9 % IV SOLN: 500.0000 mL | INTRAVENOUS | Status: DC

## 2015-02-20 MED ORDER — ONDANSETRON HCL 4 MG/2ML IJ SOLN
4.0000 mg | Freq: Four times a day (QID) | INTRAMUSCULAR | Status: DC | PRN
  Administered 2015-02-22 – 2015-02-24 (×2): 4 mg via INTRAVENOUS
  Filled 2015-02-20 (×2): qty 2

## 2015-02-20 MED ORDER — HYDROCODONE-ACETAMINOPHEN 5-325 MG PO TABS: 1.0000 | ORAL_TABLET | ORAL | Status: DC | PRN

## 2015-02-20 MED ORDER — POLYETHYLENE GLYCOL 3350 17 GM/SCOOP PO POWD
34.0000 g | Freq: Two times a day (BID) | ORAL | Status: DC
Start: 2015-02-20 — End: 2015-02-20

## 2015-02-20 MED ORDER — MORPHINE SULFATE 2 MG/ML IJ SOLN
2.0000 mg | INTRAMUSCULAR | Status: DC | PRN
  Administered 2015-02-20 – 2015-02-25 (×12): 2 mg via INTRAVENOUS
  Filled 2015-02-20 (×13): qty 1

## 2015-02-20 MED ORDER — DEXTROSE-NACL 5-0.9 % IV SOLN: INTRAVENOUS | Status: DC

## 2015-02-20 MED ORDER — LACTATED RINGERS IV SOLN
INTRAVENOUS | Status: DC | PRN
  Administered 2015-02-20 (×2): via INTRAVENOUS

## 2015-02-20 MED ORDER — DOCUSATE SODIUM 100 MG PO CAPS
100.0000 mg | ORAL_CAPSULE | Freq: Every day | ORAL | Status: DC
  Administered 2015-02-23 – 2015-02-27 (×5): 100 mg via ORAL
  Filled 2015-02-20 (×7): qty 1

## 2015-02-20 MED ORDER — DEXTROSE-NACL 5-0.9 % IV SOLN
INTRAVENOUS | Status: DC
  Administered 2015-02-20 – 2015-02-21 (×2): via INTRAVENOUS

## 2015-02-20 MED ORDER — HYDRALAZINE HCL 20 MG/ML IJ SOLN
INTRAMUSCULAR | Status: DC | PRN
  Administered 2015-02-20: 4 mg via INTRAVENOUS

## 2015-02-20 MED ORDER — ONDANSETRON HCL 4 MG/2ML IJ SOLN: 4.0000 mg | Freq: Four times a day (QID) | INTRAMUSCULAR | Status: DC | PRN

## 2015-02-20 MED ORDER — BUPIVACAINE-EPINEPHRINE (PF) 0.5% -1:200000 IJ SOLN
INTRAMUSCULAR | Status: AC
Start: 2015-02-20 — End: 2015-02-20
  Filled 2015-02-20: qty 30

## 2015-02-20 MED ORDER — LIP MEDEX EX OINT
TOPICAL_OINTMENT | CUTANEOUS | Status: AC
  Filled 2015-02-20: qty 7

## 2015-02-20 MED ORDER — SODIUM CHLORIDE 0.9 % IJ SOLN
INTRAMUSCULAR | Status: AC
  Filled 2015-02-20: qty 10

## 2015-02-20 MED ORDER — HYDROMORPHONE HCL 1 MG/ML IJ SOLN
INTRAMUSCULAR | Status: AC
  Filled 2015-02-20: qty 1

## 2015-02-20 MED ORDER — POLYETHYLENE GLYCOL 3350 17 G PO PACK
34.0000 g | PACK | Freq: Two times a day (BID) | ORAL | Status: DC
  Administered 2015-02-23 – 2015-02-26 (×8): 34 g via ORAL
  Filled 2015-02-20 (×14): qty 2

## 2015-02-20 MED ORDER — PROPOFOL 10 MG/ML IV BOLUS
INTRAVENOUS | Status: AC
  Filled 2015-02-20: qty 20

## 2015-02-20 MED ORDER — DEXTROSE 5 % IV SOLN
2.0000 g | Freq: Three times a day (TID) | INTRAVENOUS | Status: DC
  Administered 2015-02-20 – 2015-02-27 (×21): 2 g via INTRAVENOUS
  Filled 2015-02-20 (×21): qty 2

## 2015-02-20 MED ORDER — LIDOCAINE HCL (CARDIAC) 20 MG/ML IV SOLN
INTRAVENOUS | Status: DC | PRN
  Administered 2015-02-20: 60 mg via INTRAVENOUS

## 2015-02-20 MED ORDER — HYDRALAZINE HCL 20 MG/ML IJ SOLN
INTRAMUSCULAR | Status: AC
  Filled 2015-02-20: qty 1

## 2015-02-20 MED ORDER — ACETAMINOPHEN 10 MG/ML IV SOLN
1000.0000 mg | Freq: Four times a day (QID) | INTRAVENOUS | Status: AC
  Administered 2015-02-20 – 2015-02-21 (×4): 1000 mg via INTRAVENOUS
  Filled 2015-02-20 (×5): qty 100

## 2015-02-20 MED ORDER — ONDANSETRON HCL 4 MG PO TABS: 4.0000 mg | ORAL_TABLET | Freq: Four times a day (QID) | ORAL | Status: DC | PRN

## 2015-02-20 MED ORDER — CHLORHEXIDINE GLUCONATE 0.12 % MT SOLN
15.0000 mL | Freq: Two times a day (BID) | OROMUCOSAL | Status: DC
  Administered 2015-02-20 – 2015-02-26 (×12): 15 mL via OROMUCOSAL
  Filled 2015-02-20 (×14): qty 15

## 2015-02-20 MED ORDER — SUCCINYLCHOLINE CHLORIDE 20 MG/ML IJ SOLN
INTRAMUSCULAR | Status: DC | PRN
  Administered 2015-02-20: 40 mg via INTRAVENOUS

## 2015-02-20 MED ORDER — LACTATED RINGERS IV SOLN
INTRAVENOUS | Status: DC | PRN
  Administered 2015-02-20: 14:00:00 via INTRAVENOUS

## 2015-02-20 SURGICAL SUPPLY — 56 items
BLADE EXTENDED COATED 6.5IN (ELECTRODE) ×1 IMPLANT
BLADE HEX COATED 2.75 (ELECTRODE) ×2 IMPLANT
BLADE SURG SZ10 CARB STEEL (BLADE) ×1 IMPLANT
BRR ADH 5X3 SEPRAFILM 6 SHT (MISCELLANEOUS) ×1
CELLS DAT CNTRL 66122 CELL SVR (MISCELLANEOUS) ×1 IMPLANT
CHLORAPREP W/TINT 26ML (MISCELLANEOUS) ×2 IMPLANT
CLIP TI LARGE 6 (CLIP) IMPLANT
COVER MAYO STAND STRL (DRAPES) ×2 IMPLANT
DRAPE LAPAROSCOPIC ABDOMINAL (DRAPES) ×2 IMPLANT
DRAPE SHEET LG 3/4 BI-LAMINATE (DRAPES) ×1 IMPLANT
DRAPE UTILITY XL STRL (DRAPES) ×2 IMPLANT
DRAPE WARM FLUID 44X44 (DRAPE) ×2 IMPLANT
ELECT REM PT RETURN 9FT ADLT (ELECTROSURGICAL) ×2
ELECTRODE REM PT RTRN 9FT ADLT (ELECTROSURGICAL) ×1 IMPLANT
GAUZE SPONGE 4X4 12PLY STRL (GAUZE/BANDAGES/DRESSINGS) ×2 IMPLANT
GLOVE BIO SURGEON STRL SZ 6.5 (GLOVE) ×2 IMPLANT
GLOVE BIOGEL PI IND STRL 7.0 (GLOVE) ×1 IMPLANT
GLOVE BIOGEL PI INDICATOR 7.0 (GLOVE) ×1
GOWN L4 XXLG W/PAP TWL (GOWN DISPOSABLE) ×2 IMPLANT
HOLDER FOLEY CATH W/STRAP (MISCELLANEOUS) ×1 IMPLANT
KIT BASIN OR (CUSTOM PROCEDURE TRAY) ×2 IMPLANT
LEGGING LITHOTOMY PAIR STRL (DRAPES) ×1 IMPLANT
LIGASURE IMPACT 36 18CM CVD LR (INSTRUMENTS) IMPLANT
NS IRRIG 1000ML POUR BTL (IV SOLUTION) ×4 IMPLANT
PACK GENERAL/GYN (CUSTOM PROCEDURE TRAY) ×2 IMPLANT
POUCH OSTOMY 2  H (OSTOMY) ×1 IMPLANT
RETRACTOR WND ALEXIS 18 MED (MISCELLANEOUS) IMPLANT
RTRCTR WOUND ALEXIS 18CM MED (MISCELLANEOUS) ×2
SEPRAFILM PROCEDURAL PACK 3X5 (MISCELLANEOUS) ×1 IMPLANT
SHEARS HARMONIC ACE PLUS 36CM (ENDOMECHANICALS) IMPLANT
SPONGE LAP 18X18 X RAY DECT (DISPOSABLE) ×1 IMPLANT
STAPLER CUT CVD 40MM BLUE (STAPLE) ×1 IMPLANT
STAPLER PROXIMATE 75MM BLUE (STAPLE) ×1 IMPLANT
STAPLER VISISTAT 35W (STAPLE) ×1 IMPLANT
SUCTION POOLE TIP (SUCTIONS) ×2 IMPLANT
SUT NOV 1 T60/GS (SUTURE) IMPLANT
SUT NOVA NAB GS-21 0 18 T12 DT (SUTURE) ×1 IMPLANT
SUT NOVA NAB GS-22 2 0 T19 (SUTURE) ×1 IMPLANT
SUT PDS AB 1 CTX 36 (SUTURE) IMPLANT
SUT PDS AB 1 TP1 96 (SUTURE) ×2 IMPLANT
SUT PROLENE 0 CT 1 CR/8 (SUTURE) ×1 IMPLANT
SUT SILK 2 0 (SUTURE) ×2
SUT SILK 2 0 SH CR/8 (SUTURE) ×1 IMPLANT
SUT SILK 2 0SH CR/8 30 (SUTURE) IMPLANT
SUT SILK 2-0 18XBRD TIE 12 (SUTURE) ×2 IMPLANT
SUT SILK 2-0 30XBRD TIE 12 (SUTURE) ×2 IMPLANT
SUT SILK 3 0 (SUTURE) ×2
SUT SILK 3 0 SH CR/8 (SUTURE) ×2 IMPLANT
SUT SILK 3-0 18XBRD TIE 12 (SUTURE) IMPLANT
SUT VIC AB 3-0 54XBRD REEL (SUTURE) IMPLANT
SUT VIC AB 3-0 BRD 54 (SUTURE)
SUT VIC AB 3-0 SH 8-18 (SUTURE) ×2 IMPLANT
TAPE CLOTH SURG 4X10 WHT LF (GAUZE/BANDAGES/DRESSINGS) ×1 IMPLANT
TOWEL OR 17X26 10 PK STRL BLUE (TOWEL DISPOSABLE) ×3 IMPLANT
TRAY FOLEY CATH 14FRSI W/METER (CATHETERS) ×2 IMPLANT
YANKAUER SUCT BULB TIP NO VENT (SUCTIONS) ×2 IMPLANT

## 2015-02-20 NOTE — Transfer of Care (Signed)
Immediate Anesthesia Transfer of Care Note  Patient: [name] [name]  Procedure(s) Performed: Procedure(s): PARTIAL COLECTOMY END  ILEOSTOMY AND HARTMAN'S PROCEDURE (N/A)  Patient Location: PACU  Anesthesia Type:General  Level of Consciousness: awake, sedated and patient cooperative  Airway & Oxygen Therapy: Patient Spontanous Breathing and Patient connected to face mask oxygen  Post-op Assessment: Report given to RN and Post -op Vital signs reviewed and stable  Post vital signs: Reviewed and stable  Last Vitals:  Filed Vitals:   02/20/15 1209  BP: 130/87  Pulse: 80  Temp: 36.7 C  Resp: 14    Complications: No apparent anesthesia complications

## 2015-02-20 NOTE — ED Notes (Signed)
Hospital phlebotomy paged.

## 2015-02-20 NOTE — Progress Notes (Signed)
     [facility] Progress Note  Subjective:    Sister Caruth who was brought to the ER via EMS today after sustaining a colon perforation during a screening colonoscopy. Patient had immediate lower abdominal pain following the procedure. No fever or chills nausea or vomiting. She no bright red blood per rectum or melena. Past surgeries include hysterectomy and questionable appendectomy. Patient has been evaluated by surgery in the ER and is to go to surgery imminently   Objective:  Vital signs in last 24 hours: Temp:  [97.4 F (36.3 C)-98.1 F (36.7 C)] 98.1 F (36.7 C) (03/31 1209) Pulse Rate:  [61-80] 80 (03/31 1209) Resp:  [5-69] 14 (03/31 1209) BP: (102-130)/(59-89) 130/87 mmHg (03/31 1209) SpO2:  [98 %-100 %] 100 % (03/31 1209) Weight:  [77 lb (34.927 kg)] 77 lb (34.927 kg) (03/31 1008)   General:   Alert,  Well-developed,  in NAD Heart:  Regular rate and rhythm; no murmurs Pulm;lungs clear Abdomen:  Soft, tender lower abd, +guarding, no distention, Normal bowel sounds, without guarding, and without rebound.   Extremities:  Without edema. Neurologic:  Alert and  oriented x4;  grossly normal neurologically. Psych:  Alert and cooperative. Normal mood and affect.    ASSESSMENT/PLAN:  54 year old female status post screening colonoscopy who sustained a perforation during procedure admitted with severe abdominal pain. Patient has been evaluated by surgery and is to go to the OR imminently for exploratory laparotomy with possible colostomy/bowel resection. Appreciate surgical input and care.   Karesha Trzcinski, Moise BoringLori P PA-C 02/20/2015, Pager [phone]

## 2015-02-20 NOTE — Progress Notes (Signed)
Perforation occurred immediately at beginning of colonoscopy per Dr Arlyce DiceKaplan and pt brought to recovery rm per stretcher per crna,J. Monday. VSS. pt drowsy no distress or pain apparent at present the patient . Pt left on o2 @ 2l [place] .orders received from Dr Arlyce DiceKaplan [facility] EMS.with diagnosis of colon perforation. Pt's daughter made aware of diagnosis by Dr. Arlyce DiceKaplan and EMS [facility] @ 1120 02/20/15.EMS  [place] arrived @ 1130 report given PT  TRANSFERRED to ER . Pt says pain has gone to 10 on pain scale and EMS made aware.O2 remains on pt . Demographic sheet. Transfer sheet and copy of procedure report sent by EMS.pt's skin warm and dry no resp distress noted. VS remain stable.

## 2015-02-20 NOTE — Anesthesia Preprocedure Evaluation (Addendum)
Anesthesia Evaluation  Patient identified by MRN, date of birth, ID band Patient awake    Reviewed: Allergy & Precautions, NPO status , Patient's Chart, lab work & pertinent test results  History of Anesthesia Complications Negative for: history of anesthetic complications  Airway Mallampati: II  TM Distance: >3 FB Neck ROM: Full    Dental no notable dental hx. (+) Dental Advisory Given   Pulmonary Current Smoker,  breath sounds clear to auscultation  Pulmonary exam normal       Cardiovascular negative cardio ROS  Rhythm:Regular Rate:Normal     Neuro/Psych negative neurological ROS  negative psych ROS   GI/Hepatic negative GI ROS, Neg liver ROS,   Endo/Other  negative endocrine ROS  Renal/GU negative Renal ROS  negative genitourinary   Musculoskeletal negative musculoskeletal ROS (+)   Abdominal   Peds negative pediatric ROS (+)  Hematology negative hematology ROS (+)   Anesthesia Other Findings   Reproductive/Obstetrics negative OB ROS                           Anesthesia Physical Anesthesia Plan  ASA: II and emergent  Anesthesia Plan: General   Post-op Pain Management:    Induction: Intravenous, Rapid sequence and Cricoid pressure planned  Airway Management Planned: Oral ETT  Additional Equipment:   Intra-op Plan:   Post-operative Plan: Extubation in OR  Informed Consent: I have reviewed the patients History and Physical, chart, labs and discussed the procedure including the risks, benefits and alternatives for the proposed anesthesia with the patient or authorized representative who has indicated his/her understanding and acceptance.   Dental advisory given  Plan Discussed with: CRNA  Anesthesia Plan Comments: (Interpreter for consent and history)       Anesthesia Quick Evaluation

## 2015-02-20 NOTE — H&P (Signed)
  Chief Complaint: colon perforation  HPI: [name] [name] is a 54 year old [place] speaking female presenting from Dr. Marzetta BoardKaplan's office with a colon perforation during a screening colonoscopy.  The patient reports pain to her lower abdomen following the procedure.  She denies fever, chills, sweats.  Denies nausea or vomiting.  Denies BRBPR.  She reports abdominal surgeries x2 what sounds like a hysterectomy and an appendectomy in the 90s.  She reports constipation x1 month ever since she drink a "tea leaf."  Denies any significant weight loss.  Denies previous history of crohn's, UC and does not take any steroids or blood thinners.  Upon arrival to ED, she has stable vital signs.  No additional work up done as the patient has just arrived.  Daughter is present and served as a Nurse, learning disabilitytranslator.  The patient declined to have a telephone interpreter. Past Medical History  Diagnosis Date  . Constipation     Past Surgical History  Procedure Laterality Date  . Cesarean section  1992  . Appendectomy  1993  . Dilation and curettage of uterus  1990    "miscarriage"    Family History  Problem Relation Age of Onset  . Colon cancer Neg Hx    Social History:  reports that she has been smoking Cigarettes.  She has a 18.5 pack-year smoking history. She has never used smokeless tobacco. She reports that she does not drink alcohol or use illicit drugs.  Allergies: No Known Allergies   (Not in a hospital admission)  No results found for this or any previous visit (from the past 48 hour(s)). No results found.  Review of Systems  All other systems reviewed and are negative.   Blood pressure 130/87, pulse 80, temperature 98.1 F (36.7 C), temperature source Oral, resp. rate 14, SpO2 100 %. Physical Exam  Constitutional: She is oriented to person, place, and time. She appears well-developed and well-nourished. No distress.  Petite   Cardiovascular: Normal rate, regular rhythm, normal heart sounds and intact  distal pulses.  Exam reveals no gallop and no friction rub.   No murmur heard. Respiratory: Effort normal and breath sounds normal. No respiratory distress. She has no wheezes. She has no rales. She exhibits no tenderness.  GI: Soft. Bowel sounds are normal. She exhibits no distension and no mass.    Tender to lower abdomen without peritonitis   Musculoskeletal: Normal range of motion. She exhibits no edema or tenderness.  Neurological: She is alert and oriented to person, place, and time.  Skin: Skin is warm and dry. No rash noted. She is not diaphoretic. No erythema. No pallor.  Psychiatric: She has a normal mood and affect. Her behavior is normal. Judgment and thought content normal.     Assessment/Plan Colon perforation s/p colonoscopy  -To OR urgently for an exploratory laparotomy with possible colostomy and bowel resection.  I discussed the risks with the patient and her daughter including infection, bleeding, anesthesia risks.  They verbalize understanding and wish to proceed.  Again, a telephone interpreter was declined. -NPO -obtain consent -cefoxitin -IVF -pain control   Chanel Mcadams ANP-BC 02/20/2015, 12:29 PM

## 2015-02-20 NOTE — ED Provider Notes (Signed)
CSN: [id number]     Arrival date & time 02/20/15  1153 History   First MD Initiated Contact with Patient 02/20/15 1204     Chief Complaint  Patient presents with  . GI Problem     (Consider location/radiation/quality/duration/timing/severity/associated sxs/prior Treatment) HPI Comments: Patient from endoscopy Center where she was having a screening colonoscopy. Perforation of the sigmoid colon was noted during procedure. She complains of diffuse abdominal pain. She denies any vomiting or fever. She denies any chest pain or shortness of breath. Surgery at bedside on arrival.  The history is provided by the patient.    Past Medical History  Diagnosis Date  . Constipation    Past Surgical History  Procedure Laterality Date  . Cesarean section  1992  . Appendectomy  1993  . Dilation and curettage of uterus  1990    "miscarriage"   Family History  Problem Relation Age of Onset  . Colon cancer Neg Hx    History  Substance Use Topics  . Smoking status: Current Every Day Smoker -- 0.50 packs/day for 37 years    Types: Cigarettes  . Smokeless tobacco: Never Used  . Alcohol Use: No   OB History    No data available     Review of Systems  Constitutional: Negative for fever, activity change and appetite change.  HENT: Negative for congestion and rhinorrhea.   Respiratory: Negative for cough, chest tightness and shortness of breath.   Gastrointestinal: Positive for abdominal pain. Negative for nausea and vomiting.  Genitourinary: Negative for dysuria, vaginal bleeding and vaginal discharge.  Musculoskeletal: Negative for myalgias and arthralgias.  Skin: Negative for rash.  Neurological: Negative for dizziness, weakness and headaches.  A complete 10 system review of systems was obtained and all systems are negative except as noted in the HPI and PMH.      Allergies  Review of patient's allergies indicates no known allergies.  Home Medications   Prior to Admission  medications   Medication Sig Start Date End Date Taking? Authorizing Provider  acetaminophen (TYLENOL) 500 MG tablet Take 1,000 mg by mouth every 6 (six) hours as needed for headache (headache).    Yes Historical Provider, MD  docusate sodium (COLACE) 50 MG capsule Take 2 capsules (100 mg total) by mouth daily. 12/19/14  Yes Tyrone Nineyan B Grunz, MD  polyethylene glycol powder (GLYCOLAX/MIRALAX) powder Take 34 g by mouth 2 (two) times daily. Take as needed to produce 1 normal bowel movement per day. 12/19/14  Yes Tyrone Nineyan B Grunz, MD   BP 130/87 mmHg  Pulse 80  Temp(Src) 98.1 F (36.7 C) (Oral)  Resp 14  SpO2 100% Physical Exam  Constitutional: She is oriented to person, place, and time. She appears well-developed and well-nourished. No distress.  HENT:  Head: Normocephalic and atraumatic.  Mouth/Throat: Oropharynx is clear and moist. No oropharyngeal exudate.  Eyes: Conjunctivae and EOM are normal. Pupils are equal, round, and reactive to light.  Neck: Normal range of motion. Neck supple.  No meningismus.  Cardiovascular: Normal rate, regular rhythm, normal heart sounds and intact distal pulses.   No murmur heard. Pulmonary/Chest: Effort normal and breath sounds normal. No respiratory distress.  Abdominal: Soft. There is tenderness. There is guarding. There is no rebound.  Diffuse tenderness with guarding  Musculoskeletal: Normal range of motion. She exhibits no edema or tenderness.  Neurological: She is alert and oriented to person, place, and time. No cranial nerve deficit. She exhibits normal muscle tone. Coordination normal.  No ataxia on  finger to nose bilaterally. No pronator drift. 5/5 strength throughout. CN 2-12 intact. Equal grip strength. Sensation intact.   Skin: Skin is warm.  Psychiatric: She has a normal mood and affect. Her behavior is normal.  Nursing note and vitals reviewed.   ED Course  Procedures (including critical care time) Labs Review Labs Reviewed  CBC WITH  DIFFERENTIAL/PLATELET - Abnormal; Notable for the following:    RBC 5.70 (*)    MCV 70.4 (*)    MCH 22.8 (*)    All other components within normal limits  COMPREHENSIVE METABOLIC PANEL  URINALYSIS, ROUTINE W REFLEX MICROSCOPIC  TYPE AND SCREEN  SURGICAL PATHOLOGY    Imaging Review No results found.   EKG Interpretation None      MDM   Final diagnoses:  Bowel perforation   Bowel perforation after endoscopy. Vital stable. No distress. Surgery at bedside.  Patient emergently to surgery with Dr. Maisie Fus. She is given preoperative antibiotics. Vital stable in the ED.     Glynn Octave, MD 02/20/15 [phone]

## 2015-02-20 NOTE — Progress Notes (Signed)
Iv NS at [facility] continued on transfer to ER.

## 2015-02-20 NOTE — Anesthesia Procedure Notes (Signed)
Procedure Name: Intubation Date/Time: 02/20/2015 2:09 PM Performed by: Catalina PizzaGILBERT, Octavis Sheeler H Pre-anesthesia Checklist: Patient identified, Emergency Drugs available, Suction available, Patient being monitored and Timeout performed Patient Re-evaluated:Patient Re-evaluated prior to inductionOxygen Delivery Method: Circle system utilized Preoxygenation: Pre-oxygenation with 100% oxygen Intubation Type: IV induction, Cricoid Pressure applied and Rapid sequence Laryngoscope Size: Miller and 2 Grade View: Grade I Tube size: 7.5 mm Number of attempts: 1 Placement Confirmation: ETT inserted through vocal cords under direct vision,  positive ETCO2,  CO2 detector and breath sounds checked- equal and bilateral Secured at: 21 cm Tube secured with: Tape Dental Injury: Teeth and Oropharynx as per pre-operative assessment

## 2015-02-20 NOTE — Op Note (Addendum)
[facility] [address] [place], [id number]   COLONOSCOPY PROCEDURE REPORT  PATIENT: [name] [name], [name] [name]  MR#: [id number] BIRTHDATE: 04-21-1961 , 53  yrs. old GENDER: female ENDOSCOPIST: Louis Meckelobert D Lynda Capistran, MD REFERRED BY: PROCEDURE DATE:  02/20/2015 PROCEDURE:   Colonoscopy, screening First Screening Colonoscopy - Avg.  risk and is 50 yrs.  old or older Yes.  Prior Negative Screening - Now for repeat screening. N/A  History of Adenoma - Now for follow-up colonoscopy & has been > or = to 3 yrs.  N/A ASA CLASS:   Class I INDICATIONS:Colorectal Neoplasm Risk Assessment for this procedure is average risk. MEDICATIONS: Monitored anesthesia care and Propofol 140 mg IV  DESCRIPTION OF PROCEDURE:   After the risks benefits and alternatives of the procedure were thoroughly explained, informed consent was obtained.  The digital rectal exam revealed no abnormalities of the rectum.   The LB [id number]  endoscope was introduced through the anus and advanced to the sigmoid colon (approximately 25cm from anus). N.   The quality of the prep was (Suprep was used) excellent.  No photos were taken     COLON FINDINGS: Insertion of the scope the scope was passed into the sigmoid colon.  At approximately 20 -25 cm, after minimal pressure, it was noted that the colon was outside the colonic lumen, indicative of perforation.  Scope was immediately withdrawn. No other mucosal abnormalities were seen. Retroflexion was not performed. The time to sigmoid = 1 minute 26 seconds Withdrawal time = . COMPLICATIONS: Perforation occurred .  Perforation was immediately recognized as the scope was pushed into the sigmoid.  Minimal pressure was exerted during the course of the exam  ENDOSCOPIC IMPRESSION: In   Colonic perforation at the sigmoid colon  RECOMMENDATIONS: surgical evaluation  eSigned:  Louis Meckelobert D Trenton Passow, MD 02/21/2015 12:12 PM Revised: 02/21/2015 12:12 PM  cc: Tawni CarnesAndrew Wight,  MD

## 2015-02-20 NOTE — ED Notes (Signed)
Bed: [id number] Expected date:  Expected time:  Means of arrival:  Comments: Bowel perf

## 2015-02-20 NOTE — Op Note (Signed)
02/20/2015  4:42 PM  PATIENT:  [name] [name]  54 y.o. female  Patient Care Team: Nani Ravens, MD as PCP - General  PRE-OPERATIVE DIAGNOSIS:  Perforated colon   POST-OPERATIVE DIAGNOSIS:  perforated colon  PROCEDURE:  EXPLORATORY LAPAROTOMY WITH HARTMAN'S PROCEDURE   SURGEON:  Surgeon(s): Romie Levee, MD  ASSISTANT: Ashok Norris, NP  ANESTHESIA:   general  EBL:  Total I/O In: 1000 [I.V.:1000] Out: 150 [Blood:150]  DRAINS: none   SPECIMEN:  Source of Specimen:  sigmoid colon  DISPOSITION OF SPECIMEN:  PATHOLOGY  COUNTS:  YES  PLAN OF CARE: Admit to inpatient   PATIENT DISPOSITION:  PACU - hemodynamically stable.  INDICATION: This is a 54 year old female who underwent colonoscopy today with perforation. She was sent to the emergency department and upon evaluation had diffuse abdominal pain and lower abdominal peritonitis. She was hemodynamically stable throughout her preoperative workup.   OR FINDINGS: Perforation in the sigmoid colon. Dense adhesions throughout the pelvis.  DESCRIPTION: the patient was identified in the preoperative holding area and taken to the OR where they were laid supine on the operating room table.  General anesthesia was induced without difficulty. The patient was placed in lithotomy position. SCDs were also noted to be in place prior to the initiation of anesthesia.  The patient was then prepped and draped in the usual sterile fashion.   A surgical timeout was performed indicating the correct patient, procedure, positioning and need for preoperative antibiotics.   I began by making an incision through her previous lower midline scar. This was carried down through subcutaneous tissues using Bovie electrocautery. I incised the peritoneum with Metzenbaum scissors. Open the remainder of the peritoneum and began to take down the omental adhesions to the abdominal fascia. Once this was completed an Alexis wound protector was placed. The small bowel was  mobilized to the patient's right upper quadrant. I perform lysis of adhesions for approximately 20 minutes to free the small bowel from the pelvis. At that time I noticed a densely adherent sigmoid colon to the left lower quadrant and pelvis. I began to mobilize the sigmoid colon off of the abdominal sidewall. I was not able to find a good plane in between these 2 and developed the retroperitoneal plane first. The perforation was noted in the sigmoid colon. I identified the left ureter and freed this from the sigmoid mesentery. I was unable to identify a plane above the retroperitoneum and I divided this along the white line of Toldt to the splenic flexure. I then divided the colon approximately 1 cm proximal to the area of perforation.  This was done with a GIA blue load stapler. I then divided the colon mesentery close to the bowel down to the level of the rectosigmoid junction. I divided the rectum using a contour stapler at this level. I then freed the remaining small bowel from the bladder using Metzenbaum scissors. After this was completed the colon was mobilized enough to bring out as an ostomy. I evaluated whether we can perform primary anastomosis but I felt like the area was too contaminated and the dense adhesions did not allow for good mobilization into the pelvis. I felt like she would need a splenic flexure mobilization for this.  I brought out the proximal colon to the abdominal wall through a small ostomy incision. I then placed interrupted silk sutures to bring the retroperitoneum back over the retroperitoneal structures. I irrigated the abdomen with 2 L of normal saline. I then placed some  Seprafilm over the small bowel to help with adhesions postoperatively.  I closed the fascia using 2, 0 looped PDS running sutures. The skin was left open and packed. The ostomy was matured in standard [place] fashion. An ostomy appliance was placed. The patient was awakened from anesthesia and sent to the  postanesthesia care unit in stable condition. All counts were correct per operating room staff.

## 2015-02-20 NOTE — Anesthesia Postprocedure Evaluation (Signed)
  Anesthesia Post-op Note  Patient: [name] [name]  Procedure(s) Performed: Procedure(s) (LRB): PARTIAL COLECTOMY END  ILEOSTOMY AND HARTMAN'S PROCEDURE (N/A)  Patient Location: PACU  Anesthesia Type: General  Level of Consciousness: awake and alert   Airway and Oxygen Therapy: Patient Spontanous Breathing  Post-op Pain: mild  Post-op Assessment: Post-op Vital signs reviewed, Patient's Cardiovascular Status Stable, Respiratory Function Stable, Patent Airway and No signs of Nausea or vomiting  Last Vitals:  Filed Vitals:   02/20/15 1730  BP: 138/85  Pulse: 69  Temp: 36.6 C  Resp: 13    Post-op Vital Signs: stable   Complications: No apparent anesthesia complications

## 2015-02-20 NOTE — ED Notes (Signed)
Pt was at [facility] for colonoscopy today. Per report of Graham, procedure was indicative of perforation.

## 2015-02-20 NOTE — Progress Notes (Signed)
Report to PACU, RN, vss, BBS= Clear.  

## 2015-02-21 ENCOUNTER — Encounter (HOSPITAL_COMMUNITY): Admission: EM | Disposition: A | Discharge status: Home Health | Payer: Self-pay | Source: Home / Self Care

## 2015-02-21 ENCOUNTER — Encounter (HOSPITAL_COMMUNITY): Payer: Self-pay | Admitting: General Surgery

## 2015-02-21 ENCOUNTER — Telehealth: Payer: Self-pay | Admitting: *Deleted

## 2015-02-21 DIAGNOSIS — K631 Perforation of intestine (nontraumatic): Principal | ICD-10-CM

## 2015-02-21 LAB — CBC
HCT: 30.6 % — ABNORMAL LOW (ref 36.0–46.0)
Hemoglobin: 9.6 g/dL — ABNORMAL LOW (ref 12.0–15.0)
MCH: 22.1 pg — ABNORMAL LOW (ref 26.0–34.0)
MCHC: 31.4 g/dL (ref 30.0–36.0)
MCV: 70.5 fL — ABNORMAL LOW (ref 78.0–100.0)
Platelets: 183 10*3/uL (ref 150–400)
RBC: 4.34 MIL/uL (ref 3.87–5.11)
RDW: 14.3 % (ref 11.5–15.5)
WBC: 8.6 10*3/uL (ref 4.0–10.5)

## 2015-02-21 LAB — BASIC METABOLIC PANEL WITH GFR
Anion gap: 5 (ref 5–15)
BUN: 7 mg/dL (ref 6–23)
CO2: 26 mmol/L (ref 19–32)
Calcium: 7.9 mg/dL — ABNORMAL LOW (ref 8.4–10.5)
Chloride: 106 mmol/L (ref 96–112)
Creatinine, Ser: 0.56 mg/dL (ref 0.50–1.10)
GFR calc Af Amer: >90 mL/min
GFR calc non Af Amer: >90 mL/min
Glucose, Bld: 166 mg/dL — ABNORMAL HIGH (ref 70–99)
Potassium: 3.4 mmol/L — ABNORMAL LOW (ref 3.5–5.1)
Sodium: 137 mmol/L (ref 135–145)

## 2015-02-21 LAB — ABO/RH: ABO/RH(D): A POS

## 2015-02-21 SURGERY — LAPAROTOMY, EXPLORATORY
Anesthesia: General | Site: Abdomen

## 2015-02-21 MED ORDER — KCL IN DEXTROSE-NACL 20-5-0.9 MEQ/L-%-% IV SOLN
INTRAVENOUS | Status: DC
  Administered 2015-02-21 – 2015-02-22 (×2): via INTRAVENOUS
  Administered 2015-02-22: 100 mL/h via INTRAVENOUS
  Administered 2015-02-23: via INTRAVENOUS
  Administered 2015-02-23: 1000 mL via INTRAVENOUS
  Administered 2015-02-23: 100 mL/h via INTRAVENOUS
  Administered 2015-02-24: 13:00:00 via INTRAVENOUS
  Administered 2015-02-24: 100 mL/h via INTRAVENOUS
  Administered 2015-02-25: 09:00:00 via INTRAVENOUS
  Administered 2015-02-26 (×2): 50 mL/h via INTRAVENOUS
  Filled 2015-02-21 (×13): qty 1000

## 2015-02-21 MED ORDER — KETOROLAC TROMETHAMINE 15 MG/ML IJ SOLN
15.0000 mg | Freq: Four times a day (QID) | INTRAMUSCULAR | Status: AC
  Administered 2015-02-21 – 2015-02-24 (×12): 15 mg via INTRAVENOUS
  Filled 2015-02-21 (×15): qty 1

## 2015-02-21 NOTE — Telephone Encounter (Signed)
No answer to phone call, no message left

## 2015-02-21 NOTE — Progress Notes (Signed)
     [facility] Progress Note  Subjective:   POD #1  S/p/ exploratory laparotomy with Hartmanns procedure.  C/O abd pain.Trying to cough to clear secretions.   Objective:  Vital signs in last 24 hours: Temp:  [97.4 F (36.3 C)-99.7 F (37.6 C)] 99 F (37.2 C) (04/01 0547) Pulse Rate:  [61-86] 67 (04/01 0757) Resp:  [5-69] 16 (04/01 0547) BP: (102-162)/(59-93) 121/80 mmHg (04/01 0547) SpO2:  [98 %-100 %] 100 % (04/01 0757) Weight:  [77 lb (34.927 kg)] 77 lb (34.927 kg) (03/31 2147) Last BM Date: 02/20/15 General:   Alert,  Well-developed,    in NAD Heart:  Regular rate and rhythm; no murmurs Pulm;lungs clear Abdomen:  Ostomy with blood tinged output, post op tenderness of abd,  Extremities:  Without edema. Neurologic:  Alert and  oriented x4;  grossly normal neurologically.   Intake/Output from previous day: 03/31 0701 - 04/01 0700 In: 3180 [I.V.:3180] Out: 1800 [Urine:1650; Blood:150] Intake/Output this shift:    Lab Results:  Recent Labs  02/20/15 1318 02/21/15 0453  WBC 5.2 8.6  HGB 13.0 9.6*  HCT 40.1 30.6*  PLT 189 183   BMET  Recent Labs  02/20/15 1318 02/21/15 0453  NA 140 137  K 4.4 3.4*  CL 108 106  CO2 25 26  GLUCOSE 70 166*  BUN 11 7  CREATININE 0.57 0.56  CALCIUM 9.4 7.9*   LFT  Recent Labs  02/20/15 1318  PROT 7.6  ALBUMIN 4.7  AST 15  ALT 12  ALKPHOS 107  BILITOT 1.2     ASSESSMENT/PLAN:   54 yo female s/p exploratory laparotomy with Hartmans procedure due to perforated sigmoid. Pt stable POD 1. Appreciate [facility] care. Will follow peripherally.     LOS: 1 day   Simar Pothier, Tollie PizzaLori P PA-C 02/21/2015, Pager [phone]

## 2015-02-21 NOTE — Progress Notes (Signed)
Patient ID: [name] [name], female   DOB: 1961-05-25, 54 y.o.   MRN: [id number]     [address] [place], [place] [address]    Phone: [phone] FAX: [phone]     Subjective: No n/v.  C/o pain with coughing.  Daughter is at bedside.  k 3.3  Objective:  Vital signs:  Filed Vitals:   02/21/15 0200 02/21/15 0547 02/21/15 0757 02/21/15 1001  BP: 135/74 121/80  125/83  Pulse: 71 74 67 79  Temp: 98.4 F (36.9 C) 99 F (37.2 C)  98.6 F (37 C)  TempSrc: Oral Oral  Oral  Resp: 16 16  16   Height:      Weight:      SpO2: 100% 100% 100% 100%    Last BM Date: 02/20/15  Intake/Output   Yesterday:  03/31 0701 - 04/01 0700 In: 3180 [I.V.:3180] Out: 1800 [Urine:1650; Blood:150] This shift:    I/O last 3 completed shifts: In: 3180 [I.V.:3180] Out: 1800 [Urine:1650; Blood:150]    Physical Exam: General: Pt awake/alert/oriented x4 in no acute distress Chest: cta. No chest wall pain w good excursion CV:  Pulses intact.  Regular rhythm MS: Normal AROM mjr joints.  No obvious deformity Abdomen: Soft.  Nondistended.  Hypoactive bowel sounds.  Midline dressing is dry.  LLQ ileostomy is pink, and swollen, serosanguinous output.  No evidence of peritonitis.  No incarcerated hernias. Ext:  SCDs BLE.  No mjr edema.  No cyanosis Skin: No petechiae / purpura   Problem List:   Active Problems:   Colon perforation    Results:   Labs: Results for orders placed or performed during the hospital encounter of 02/20/15 (from the past 48 hour(s))  CBC with Differential/Platelet     Status: Abnormal   Collection Time: 02/20/15  1:18 PM  Result Value Ref Range   WBC 5.2 4.0 - 10.5 K/uL   RBC 5.70 (H) 3.87 - 5.11 MIL/uL   Hemoglobin 13.0 12.0 - 15.0 g/dL   HCT 40.1 36.0 - 46.0 %   MCV 70.4 (L) 78.0 - 100.0 fL   MCH 22.8 (L) 26.0 - 34.0 pg   MCHC 32.4 30.0 - 36.0 g/dL   RDW 14.4 11.5 - 15.5 %   Platelets 189 150 - 400 K/uL     Comment: RESULT REPEATED AND VERIFIED PLATELET COUNT CONFIRMED BY SMEAR    Neutrophils Relative % 61 43 - 77 %   Lymphocytes Relative 35 12 - 46 %   Monocytes Relative 3 3 - 12 %   Eosinophils Relative 1 0 - 5 %   Basophils Relative 0 0 - 1 %   Neutro Abs 3.1 1.7 - 7.7 K/uL   Lymphs Abs 1.8 0.7 - 4.0 K/uL   Monocytes Absolute 0.2 0.1 - 1.0 K/uL   Eosinophils Absolute 0.1 0.0 - 0.7 K/uL   Basophils Absolute 0.0 0.0 - 0.1 K/uL   RBC Morphology TARGET CELLS   Comprehensive metabolic panel     Status: None   Collection Time: 02/20/15  1:18 PM  Result Value Ref Range   Sodium 140 135 - 145 mmol/L   Potassium 4.4 3.5 - 5.1 mmol/L   Chloride 108 96 - 112 mmol/L   CO2 25 19 - 32 mmol/L   Glucose, Bld 70 70 - 99 mg/dL   BUN 11 6 - 23 mg/dL   Creatinine, Ser 0.57 0.50 - 1.10 mg/dL   Calcium 9.4  8.4 - 10.5 mg/dL   Total Protein 7.6 6.0 - 8.3 g/dL   Albumin 4.7 3.5 - 5.2 g/dL   AST 15 0 - 37 U/L   ALT 12 0 - 35 U/L   Alkaline Phosphatase 107 39 - 117 U/L   Total Bilirubin 1.2 0.3 - 1.2 mg/dL   GFR calc non Af Amer >90 >90 mL/min   GFR calc Af Amer >90 >90 mL/min    Comment: (NOTE) The eGFR has been calculated using the CKD EPI equation. This calculation has not been validated in all clinical situations. eGFR's persistently <90 mL/min signify possible Chronic Kidney Disease.    Anion gap 7 5 - 15  Type and screen     Status: None   Collection Time: 02/20/15  2:10 PM  Result Value Ref Range   ABO/RH(D) A POS    Antibody Screen NEG    Sample Expiration 02/23/2015   ABO/Rh     Status: None   Collection Time: 02/20/15  2:10 PM  Result Value Ref Range   ABO/RH(D) A POS   Basic metabolic panel     Status: Abnormal   Collection Time: 02/21/15  4:53 AM  Result Value Ref Range   Sodium 137 135 - 145 mmol/L   Potassium 3.4 (L) 3.5 - 5.1 mmol/L    Comment: DELTA CHECK NOTED REPEATED TO VERIFY    Chloride 106 96 - 112 mmol/L   CO2 26 19 - 32 mmol/L   Glucose, Bld 166 (H) 70 -  99 mg/dL   BUN 7 6 - 23 mg/dL   Creatinine, Ser 0.56 0.50 - 1.10 mg/dL   Calcium 7.9 (L) 8.4 - 10.5 mg/dL   GFR calc non Af Amer >90 >90 mL/min   GFR calc Af Amer >90 >90 mL/min    Comment: (NOTE) The eGFR has been calculated using the CKD EPI equation. This calculation has not been validated in all clinical situations. eGFR's persistently <90 mL/min signify possible Chronic Kidney Disease.    Anion gap 5 5 - 15  CBC     Status: Abnormal   Collection Time: 02/21/15  4:53 AM  Result Value Ref Range   WBC 8.6 4.0 - 10.5 K/uL   RBC 4.34 3.87 - 5.11 MIL/uL   Hemoglobin 9.6 (L) 12.0 - 15.0 g/dL    Comment: DELTA CHECK NOTED REPEATED TO VERIFY    HCT 30.6 (L) 36.0 - 46.0 %   MCV 70.5 (L) 78.0 - 100.0 fL   MCH 22.1 (L) 26.0 - 34.0 pg   MCHC 31.4 30.0 - 36.0 g/dL   RDW 14.3 11.5 - 15.5 %   Platelets 183 150 - 400 K/uL    Imaging / Studies: No results found.  Medications / Allergies:  Scheduled Meds: . acetaminophen  1,000 mg Intravenous 4 times per day  . antiseptic oral rinse  7 mL Mouth Rinse q12n4p  . cefOXitin  2 g Intravenous 3 times per day  . chlorhexidine  15 mL Mouth Rinse BID  . docusate sodium  100 mg Oral Daily  . enoxaparin (LOVENOX) injection  30 mg Subcutaneous Q24H  . polyethylene glycol  34 g Oral BID   Continuous Infusions: . dextrose 5 % and 0.9 % NaCl with KCl 20 mEq/L     PRN Meds:.HYDROcodone-acetaminophen, morphine injection, ondansetron **OR** ondansetron (ZOFRAN) IV  Antibiotics: Anti-infectives    Start     Dose/Rate Route Frequency Ordered Stop   02/20/15 1400  cefOXitin (MEFOXIN) 2 g in dextrose  5 % 50 mL IVPB     2 g 100 mL/hr over 30 Minutes Intravenous 3 times per day 02/20/15 1211          Assessment/Plan POD#1 exploratory laparotomy/Hartman's procedure---Dr. Joyice [name] -NPO, await bowel function -IVF, change to D5NS with KCL and repeat BMP in AM -pain control -anti-emetics -WOC consult for ileostomy teaching   -SCD/lovenox -cefoxitin D#2 -mobilize -IS Hypokalemia -supplement with IVF, repeat BMP in AM  [name] [name], ANP-BC [facility] Pager [phone](7A-4:30P)   02/21/2015 10:07 AM

## 2015-02-21 NOTE — Consult Note (Addendum)
WOC ostomy consult note Stoma type/location: LUQ Colostomy created emergently on 02/20/15 Stomal assessment/size: 1 and 3/4 inch inches round, moist, edematous.  Os at center Larger at dome than at base. Peristomal assessment: intact, clear Treatment options for stomal/peristomal skin: skin barrier ring Output None.  Scant amount of serosanguinous drainage. Ostomy pouching: 2pc. 2 and 1/4 inch pouching system with skin barrier ring Education provided: Patient's daughter (who interprets for her mother) is taught that stoma is a part of the intestine and that bowel movement will come from this ostomy.  Also taught that it is normal for a small amount of stool to pass from her rectum.  Today daughter is getting used to site of stoma and does not observe ostomy pouch change.  She takes a Building services engineerphoto today of the stoma with her phone to share with her mother later in the day. An educational booklet is provided to the daughter today for her use over the weekend. WOC nursing team will follow, and will remain available to this patient, the nursing, surgical and medical teams.   Thanks, Ladona MowLaurie Hisayo Delossantos, MSN, RN, GNP, [place], CWON-AP [phone])

## 2015-02-21 NOTE — Telephone Encounter (Signed)
  Follow up Call-  Call back number 02/20/2015  Post procedure Call Back phone  # [phone]  Permission to leave phone message No  comments NO VOICEMAIL

## 2015-02-22 LAB — BASIC METABOLIC PANEL
Anion gap: 9 (ref 5–15)
BUN: 6 mg/dL (ref 6–23)
CO2: 25 mmol/L (ref 19–32)
Calcium: 7.5 mg/dL — ABNORMAL LOW (ref 8.4–10.5)
Chloride: 106 mmol/L (ref 96–112)
Creatinine, Ser: 0.43 mg/dL — ABNORMAL LOW (ref 0.50–1.10)
GFR calc non Af Amer: >90 mL/min (ref >90)
Glucose, Bld: 115 mg/dL — ABNORMAL HIGH (ref 70–99)
Potassium: 3.3 mmol/L — ABNORMAL LOW (ref 3.5–5.1)
SODIUM: 140 mmol/L (ref 135–145)

## 2015-02-22 LAB — CBC
HCT: 24.6 % — ABNORMAL LOW (ref 36.0–46.0)
HEMOGLOBIN: 7.8 g/dL — AB (ref 12.0–15.0)
MCH: 22.5 pg — AB (ref 26.0–34.0)
MCHC: 31.7 g/dL (ref 30.0–36.0)
MCV: 70.9 fL — AB (ref 78.0–100.0)
Platelets: 128 10*3/uL — ABNORMAL LOW (ref 150–400)
RBC: 3.47 MIL/uL — AB (ref 3.87–5.11)
RDW: 14.7 % (ref 11.5–15.5)
WBC: 8.2 10*3/uL (ref 4.0–10.5)

## 2015-02-22 NOTE — Progress Notes (Signed)
Patient ID: [name] [name], female   DOB: Feb 03, 1961, 54 y.o.   MRN: [id number] [facility] Surgery Progress Note:   2 Days Post-Op  Subjective: Mental status is clear.   Objective: Vital signs in last 24 hours: Temp:  [98.6 F (37 C)-99.9 F (37.7 C)] 99.9 F (37.7 C) (04/02 0557) Pulse Rate:  [75-81] 81 (04/02 0557) Resp:  [16-18] 18 (04/02 0557) BP: (113-150)/(76-83) 139/78 mmHg (04/02 0557) SpO2:  [98 %-100 %] 98 % (04/02 0557)  Intake/Output from previous day: 04/01 0701 - 04/02 0700 In: 2500 [I.V.:2400; IV Piggyback:100] Out: 1400 [Urine:1350; Stool:50] Intake/Output this shift:    Physical Exam: Work of breathing is normal.  No flatus in ostomy bag.  Incision clean.  Will redress q shilft  Lab Results:  Results for orders placed or performed during the hospital encounter of 02/20/15 (from the past 48 hour(s))  CBC with Differential/Platelet     Status: Abnormal   Collection Time: 02/20/15  1:18 PM  Result Value Ref Range   WBC 5.2 4.0 - 10.5 K/uL   RBC 5.70 (H) 3.87 - 5.11 MIL/uL   Hemoglobin 13.0 12.0 - 15.0 g/dL   HCT 40.1 36.0 - 46.0 %   MCV 70.4 (L) 78.0 - 100.0 fL   MCH 22.8 (L) 26.0 - 34.0 pg   MCHC 32.4 30.0 - 36.0 g/dL   RDW 14.4 11.5 - 15.5 %   Platelets 189 150 - 400 K/uL    Comment: RESULT REPEATED AND VERIFIED PLATELET COUNT CONFIRMED BY SMEAR    Neutrophils Relative % 61 43 - 77 %   Lymphocytes Relative 35 12 - 46 %   Monocytes Relative 3 3 - 12 %   Eosinophils Relative 1 0 - 5 %   Basophils Relative 0 0 - 1 %   Neutro Abs 3.1 1.7 - 7.7 K/uL   Lymphs Abs 1.8 0.7 - 4.0 K/uL   Monocytes Absolute 0.2 0.1 - 1.0 K/uL   Eosinophils Absolute 0.1 0.0 - 0.7 K/uL   Basophils Absolute 0.0 0.0 - 0.1 K/uL   RBC Morphology TARGET CELLS   Comprehensive metabolic panel     Status: None   Collection Time: 02/20/15  1:18 PM  Result Value Ref Range   Sodium 140 135 - 145 mmol/L   Potassium 4.4 3.5 - 5.1 mmol/L   Chloride 108 96 - 112 mmol/L   CO2 25 19 - 32  mmol/L   Glucose, Bld 70 70 - 99 mg/dL   BUN 11 6 - 23 mg/dL   Creatinine, Ser 0.57 0.50 - 1.10 mg/dL   Calcium 9.4 8.4 - 10.5 mg/dL   Total Protein 7.6 6.0 - 8.3 g/dL   Albumin 4.7 3.5 - 5.2 g/dL   AST 15 0 - 37 U/L   ALT 12 0 - 35 U/L   Alkaline Phosphatase 107 39 - 117 U/L   Total Bilirubin 1.2 0.3 - 1.2 mg/dL   GFR calc non Af Amer >90 >90 mL/min   GFR calc Af Amer >90 >90 mL/min    Comment: (NOTE) The eGFR has been calculated using the CKD EPI equation. This calculation has not been validated in all clinical situations. eGFR's persistently <90 mL/min signify possible Chronic Kidney Disease.    Anion gap 7 5 - 15  Type and screen     Status: None   Collection Time: 02/20/15  2:10 PM  Result Value Ref Range   ABO/RH(D) A POS    Antibody Screen NEG  Sample Expiration 02/23/2015   ABO/Rh     Status: None   Collection Time: 02/20/15  2:10 PM  Result Value Ref Range   ABO/RH(D) A POS   Basic metabolic panel     Status: Abnormal   Collection Time: 02/21/15  4:53 AM  Result Value Ref Range   Sodium 137 135 - 145 mmol/L   Potassium 3.4 (L) 3.5 - 5.1 mmol/L    Comment: DELTA CHECK NOTED REPEATED TO VERIFY    Chloride 106 96 - 112 mmol/L   CO2 26 19 - 32 mmol/L   Glucose, Bld 166 (H) 70 - 99 mg/dL   BUN 7 6 - 23 mg/dL   Creatinine, Ser 0.56 0.50 - 1.10 mg/dL   Calcium 7.9 (L) 8.4 - 10.5 mg/dL   GFR calc non Af Amer >90 >90 mL/min   GFR calc Af Amer >90 >90 mL/min    Comment: (NOTE) The eGFR has been calculated using the CKD EPI equation. This calculation has not been validated in all clinical situations. eGFR's persistently <90 mL/min signify possible Chronic Kidney Disease.    Anion gap 5 5 - 15  CBC     Status: Abnormal   Collection Time: 02/21/15  4:53 AM  Result Value Ref Range   WBC 8.6 4.0 - 10.5 K/uL   RBC 4.34 3.87 - 5.11 MIL/uL   Hemoglobin 9.6 (L) 12.0 - 15.0 g/dL    Comment: DELTA CHECK NOTED REPEATED TO VERIFY    HCT 30.6 (L) 36.0 - 46.0 %   MCV  70.5 (L) 78.0 - 100.0 fL   MCH 22.1 (L) 26.0 - 34.0 pg   MCHC 31.4 30.0 - 36.0 g/dL   RDW 14.3 11.5 - 15.5 %   Platelets 183 150 - 400 K/uL  Basic metabolic panel     Status: Abnormal   Collection Time: 02/22/15  5:10 AM  Result Value Ref Range   Sodium 140 135 - 145 mmol/L   Potassium 3.3 (L) 3.5 - 5.1 mmol/L   Chloride 106 96 - 112 mmol/L   CO2 25 19 - 32 mmol/L   Glucose, Bld 115 (H) 70 - 99 mg/dL   BUN 6 6 - 23 mg/dL   Creatinine, Ser 0.43 (L) 0.50 - 1.10 mg/dL   Calcium 7.5 (L) 8.4 - 10.5 mg/dL   GFR calc non Af Amer >90 >90 mL/min   GFR calc Af Amer >90 >90 mL/min    Comment: (NOTE) The eGFR has been calculated using the CKD EPI equation. This calculation has not been validated in all clinical situations. eGFR's persistently <90 mL/min signify possible Chronic Kidney Disease.    Anion gap 9 5 - 15  CBC     Status: Abnormal   Collection Time: 02/22/15  5:10 AM  Result Value Ref Range   WBC 8.2 4.0 - 10.5 K/uL   RBC 3.47 (L) 3.87 - 5.11 MIL/uL   Hemoglobin 7.8 (L) 12.0 - 15.0 g/dL    Comment: DELTA CHECK NOTED REPEATED TO VERIFY    HCT 24.6 (L) 36.0 - 46.0 %   MCV 70.9 (L) 78.0 - 100.0 fL   MCH 22.5 (L) 26.0 - 34.0 pg   MCHC 31.7 30.0 - 36.0 g/dL   RDW 14.7 11.5 - 15.5 %   Platelets 128 (L) 150 - 400 K/uL    Comment: DELTA CHECK NOTED REPEATED TO VERIFY SPECIMEN CHECKED FOR CLOTS     Radiology/Results: No results found.  Anti-infectives: Anti-infectives    Start  Dose/Rate Route Frequency Ordered Stop   02/20/15 1400  cefOXitin (MEFOXIN) 2 g in dextrose 5 % 50 mL IVPB     2 g 100 mL/hr over 30 Minutes Intravenous 3 times per day 02/20/15 1211        Assessment/Plan: Problem List: Patient Active Problem List   Diagnosis Date Noted  . Colon perforation 02/20/2015  . Constipation 12/19/2014  . Colon cancer screening 12/19/2014  . Ingestion of foreign substance 11/06/2014  . Ingestion of substance 11/06/2014  . Neuropathy of foot 12/10/2013  .  Skin lesion 11/12/2011  . Corn of toe 10/18/2011  . TOBACCO DEPENDENCE 01/19/2007  . HEARING LOSS NOS OR DEAFNESS 01/19/2007  . MENOPAUSAL SYNDROME 01/19/2007    Npo till passing flatus.  Doing well overall  2 Days Post-Op    LOS: 2 days   Matt B. Hassell Done, MD, [facility] [phone] beeper [phone]  02/22/2015 9:26 AM

## 2015-02-23 LAB — BASIC METABOLIC PANEL
ANION GAP: 5 (ref 5–15)
CHLORIDE: 110 mmol/L (ref 96–112)
CO2: 25 mmol/L (ref 19–32)
CREATININE: 0.43 mg/dL — AB (ref 0.50–1.10)
Calcium: 8 mg/dL — ABNORMAL LOW (ref 8.4–10.5)
GFR calc non Af Amer: >90 mL/min (ref >90)
Glucose, Bld: 117 mg/dL — ABNORMAL HIGH (ref 70–99)
Potassium: 3.8 mmol/L (ref 3.5–5.1)
Sodium: 140 mmol/L (ref 135–145)

## 2015-02-23 LAB — CBC
HEMATOCRIT: 23.6 % — AB (ref 36.0–46.0)
Hemoglobin: 7.4 g/dL — ABNORMAL LOW (ref 12.0–15.0)
MCH: 22.5 pg — ABNORMAL LOW (ref 26.0–34.0)
MCHC: 31.4 g/dL (ref 30.0–36.0)
MCV: 71.7 fL — ABNORMAL LOW (ref 78.0–100.0)
Platelets: 131 10*3/uL — ABNORMAL LOW (ref 150–400)
RBC: 3.29 MIL/uL — ABNORMAL LOW (ref 3.87–5.11)
RDW: 14.8 % (ref 11.5–15.5)
WBC: 8.2 10*3/uL (ref 4.0–10.5)

## 2015-02-23 NOTE — Progress Notes (Signed)
Patient ID: [name] [name], female   DOB: 03/01/1961, 54 y.o.   MRN: [id number] [facility] Surgery Progress Note:   3 Days Post-Op  Subjective: Mental status is clear.   Objective: Vital signs in last 24 hours: Temp:  [98.8 F (37.1 C)-98.9 F (37.2 C)] 98.8 F (37.1 C) (04/03 0610) Pulse Rate:  [73-74] 74 (04/03 0610) Resp:  [18] 18 (04/03 0610) BP: (134-135)/(73-78) 135/78 mmHg (04/03 0610) SpO2:  [100 %] 100 % (04/03 0610)  Intake/Output from previous day: 04/02 0701 - 04/03 0700 In: 2500 [I.V.:2350; IV Piggyback:150] Out: 1000 [Urine:1000] Intake/Output this shift:    Physical Exam: Work of breathing is normal but she does have an audible cough.  Ostomy with some liquid output.    Lab Results:  Results for orders placed or performed during the hospital encounter of 02/20/15 (from the past 48 hour(s))  Basic metabolic panel     Status: Abnormal   Collection Time: 02/22/15  5:10 AM  Result Value Ref Range   Sodium 140 135 - 145 mmol/L   Potassium 3.3 (L) 3.5 - 5.1 mmol/L   Chloride 106 96 - 112 mmol/L   CO2 25 19 - 32 mmol/L   Glucose, Bld 115 (H) 70 - 99 mg/dL   BUN 6 6 - 23 mg/dL   Creatinine, Ser 0.43 (L) 0.50 - 1.10 mg/dL   Calcium 7.5 (L) 8.4 - 10.5 mg/dL   GFR calc non Af Amer >90 >90 mL/min   GFR calc Af Amer >90 >90 mL/min    Comment: (NOTE) The eGFR has been calculated using the CKD EPI equation. This calculation has not been validated in all clinical situations. eGFR's persistently <90 mL/min signify possible Chronic Kidney Disease.    Anion gap 9 5 - 15  CBC     Status: Abnormal   Collection Time: 02/22/15  5:10 AM  Result Value Ref Range   WBC 8.2 4.0 - 10.5 K/uL   RBC 3.47 (L) 3.87 - 5.11 MIL/uL   Hemoglobin 7.8 (L) 12.0 - 15.0 g/dL    Comment: DELTA CHECK NOTED REPEATED TO VERIFY    HCT 24.6 (L) 36.0 - 46.0 %   MCV 70.9 (L) 78.0 - 100.0 fL   MCH 22.5 (L) 26.0 - 34.0 pg   MCHC 31.7 30.0 - 36.0 g/dL   RDW 14.7 11.5 - 15.5 %   Platelets 128 (L)  150 - 400 K/uL    Comment: DELTA CHECK NOTED REPEATED TO VERIFY SPECIMEN CHECKED FOR CLOTS   Basic metabolic panel     Status: Abnormal   Collection Time: 02/23/15  5:20 AM  Result Value Ref Range   Sodium 140 135 - 145 mmol/L   Potassium 3.8 3.5 - 5.1 mmol/L   Chloride 110 96 - 112 mmol/L   CO2 25 19 - 32 mmol/L   Glucose, Bld 117 (H) 70 - 99 mg/dL   BUN <5 (L) 6 - 23 mg/dL   Creatinine, Ser 0.43 (L) 0.50 - 1.10 mg/dL   Calcium 8.0 (L) 8.4 - 10.5 mg/dL   GFR calc non Af Amer >90 >90 mL/min   GFR calc Af Amer >90 >90 mL/min    Comment: (NOTE) The eGFR has been calculated using the CKD EPI equation. This calculation has not been validated in all clinical situations. eGFR's persistently <90 mL/min signify possible Chronic Kidney Disease.    Anion gap 5 5 - 15  CBC     Status: Abnormal   Collection Time: 02/23/15  5:20  AM  Result Value Ref Range   WBC 8.2 4.0 - 10.5 K/uL   RBC 3.29 (L) 3.87 - 5.11 MIL/uL   Hemoglobin 7.4 (L) 12.0 - 15.0 g/dL   HCT 23.6 (L) 36.0 - 46.0 %   MCV 71.7 (L) 78.0 - 100.0 fL   MCH 22.5 (L) 26.0 - 34.0 pg   MCHC 31.4 30.0 - 36.0 g/dL   RDW 14.8 11.5 - 15.5 %   Platelets 131 (L) 150 - 400 K/uL    Radiology/Results: No results found.  Anti-infectives: Anti-infectives    Start     Dose/Rate Route Frequency Ordered Stop   02/20/15 1400  cefOXitin (MEFOXIN) 2 g in dextrose 5 % 50 mL IVPB     2 g 100 mL/hr over 30 Minutes Intravenous 3 times per day 02/20/15 1211        Assessment/Plan: Problem List: Patient Active Problem List   Diagnosis Date Noted  . Colon perforation 02/20/2015  . Constipation 12/19/2014  . Colon cancer screening 12/19/2014  . Ingestion of foreign substance 11/06/2014  . Ingestion of substance 11/06/2014  . Neuropathy of foot 12/10/2013  . Skin lesion 11/12/2011  . Corn of toe 10/18/2011  . TOBACCO DEPENDENCE 01/19/2007  . HEARING LOSS NOS OR DEAFNESS 01/19/2007  . MENOPAUSAL SYNDROME 01/19/2007    Will start  clear liquids.  Encouraged to use incentive spirometry.   3 Days Post-Op    LOS: 3 days   Matt B. Hassell Done, MD, [facility] [phone] beeper [phone]  02/23/2015 8:55 AM

## 2015-02-24 ENCOUNTER — Inpatient Hospital Stay (HOSPITAL_COMMUNITY): Payer: 59

## 2015-02-24 MED ORDER — GUAIFENESIN 100 MG/5ML PO SOLN
5.0000 mL | ORAL | Status: DC | PRN
  Administered 2015-02-24 – 2015-02-27 (×3): 100 mg via ORAL
  Filled 2015-02-24 (×3): qty 10

## 2015-02-24 NOTE — Progress Notes (Signed)
I finally was able to contact Pt's daughter and informed her about her mom coughing last night and the fact that she is going to have a chest  xray this am.

## 2015-02-24 NOTE — Consult Note (Signed)
WOC ostomy follow up Stoma type/location: LUQ colostomy. Visit made today to determine effectiveness of pouch applied on Friday.  Pouch and seal are intact and stoma is functioning. Next pouch change due tomorrow.  Daughter will be here to observe/participate. Stomal assessment/size: Not seen today. Peristomal assessment: not seen today Treatment options for stomal/peristomal skin: None indicated Output thin, green effluent Ostomy pouching:2pc. 2 and 1/4 inch pouching system with skin barrier ring Education provided: One page patient education teaching sheet provided today to daughter.   Enrolled patient in [facility] Secure Start Discharge program: Yes WOC nursing team will follow, and will remain available to this patient, the nursing, surgical and medical teams.   Thanks. Ladona MowLaurie Maie Kesinger, MSN, RN, GNP, [place], CWON-AP [phone])

## 2015-02-24 NOTE — Care Management Note (Addendum)
    Page 1 of 1   02/27/2015     12:40:07 PM CARE MANAGEMENT NOTE 02/27/2015  Patient:  [name] [name]   Account Number:  [id number]  Date Initiated:  02/21/2015  Documentation initiated by:  [name] [name] [name] [name]  Subjective/Objective Assessment:   54 yo female admitted s/p colectomy with colostomy. PTA lived at home with family.     Action/Plan:   Home when stable   Anticipated DC Date:  02/27/2015   Anticipated DC Plan:  HOME W HOME HEALTH SERVICES      DC Planning Services  CM consult      [facility]   Choice offered to / List presented to:  C-4 Adult Children        HH arranged  HH-1 RN  HH-10 DISEASE MANAGEMENT      HH agency  [facility]   Status of service:  Completed, signed off Medicare Important Message given?   (If response is "NO", [name] following Medicare IM given date fields will be blank) Date Medicare IM given:   Medicare IM given by:   Date Additional Medicare IM given:   Additional Medicare IM given by:    Discharge Disposition:  HOME W HOME HEALTH SERVICES  Per UR Regulation:  Reviewed for med. necessity/level of care/duration of stay  If discussed at Long Length of Stay Meetings, dates discussed:   02/27/2015    Comments:  02-24-15 [name] IshiharaSuzanne Nimra Puccinelli RN CM 1300 Spoke with patient and daughter at bedside, daughter interpreted for patient. [name] with ost[facility] care. Daughter chose [name] [name], contacted [facility] to follow for d/c needs. Awaiting final orders.

## 2015-02-24 NOTE — Progress Notes (Signed)
Pt's incision has a small area in the center that is separating. Day nurse noticed it also and reported it to me. No bleeding, drainage or redness noted. Will continue to monitor.

## 2015-02-24 NOTE — Progress Notes (Signed)
Pt c/o that she has been coughing almost nonstop and she is not able to sleep. Her coughing is keeping her awake. VS stable. Per respiratory lungs sound clear buy diminished. Notified MD on call, her ordered a chest xray in am and robitussin prn. It seems to be helping with her cough.

## 2015-02-24 NOTE — Progress Notes (Signed)
Pt asked me to call her daughter and let her know about the xray and the coughing she was having. I tried the number in the chart, but I received a message saying that the phone was not accepting calls. Will let pt know.

## 2015-02-24 NOTE — Discharge Summary (Signed)
Physician Discharge Summary  Patient ID: [name] [name] MRN: [id number] DOB/AGE: October 15, 1961 54 y.o.  Admit date: 02/20/2015 Discharge date: 02/27/2015  Admission Diagnoses:  Colon perforation s/p colonoscopy   Discharge Diagnoses:  Perforated colon after screening colonoscopy 02/20/15, Dr. Arlyce DiceKaplan Hx of constipation   Active Problems:   Colon perforation   PROCEDURES: EXPLORATORY LAPAROTOMY WITH HARTMAN'S PROCEDURE, 02/20/15, Dr. Cristi LoronAlicia Thomas   Hospital Course:  [name] [name] is a 54 year old [place] speaking female presenting from Dr. Marzetta BoardKaplan's office with a colon perforation during a screening colonoscopy. The patient reports pain to her lower abdomen following the procedure. She denies fever, chills, sweats. Denies nausea or vomiting. Denies BRBPR. She reports abdominal surgeries x2 what sounds like a hysterectomy and an appendectomy in the 90s. She reports constipation x1 month ever since she drink a "tea leaf." Denies any significant weight loss. Denies previous history of crohn's, UC and does not take any steroids or blood thinners. Upon arrival to ED, she has stable vital signs. No additional work up done as the patient has just arrived. She was taken to the OR with the above procedure as noted.  She has done well with this and her diet was up to full liquids, although she was taking mostly clears.  Her wound looks good and the ostomy is putting out a fair amount of gas and clear stool colored fluid.  She has a history of constipation, but is post colonoscopy so we have advanced her diet and if she does well we plan to send her home tomorrow of soft diet.  She complained of pain and not eating much for the next couple days.  Even with liquids she reported pain so we repeated the CT which showed normal post op changes, nothing acute.  I ask Dr. Juanda ChanceBrodie to see in the AM 02/27/15 because with combination pain with PO's and drop in HCT I was also worried about an ulcer.  AM exam on 02/27/15 she said  she felt much better and want to go home.   We put her on a regular diet and she did well.  She is working on her colostomy care.  Her open wound is almost healed.  I put her on FE and MVI with iron to help with her H/H.  Home health is arranged to help with colostomy care at home.  CBC Latest Ref Rng 02/27/2015 02/26/2015 02/23/2015  WBC 4.0 - 10.5 K/uL 5.4 5.5 8.2  Hemoglobin 12.0 - 15.0 g/dL 7.8(L) 7.6(L) 7.4(L)  Hematocrit 36.0 - 46.0 % 24.8(L) 24.6(L) 23.6(L)  Platelets 150 - 400 K/uL 233 194 131(L)   H/H on 02/20/15 was 13/40. CMP Latest Ref Rng 02/27/2015 02/26/2015 02/23/2015  Glucose 70 - 99 mg/dL 98 90 119(J117(H)  BUN 6 - 23 mg/dL <4(N<5(L) <8(G<5(L) <9(F<5(L)  Creatinine 0.50 - 1.10 mg/dL 6.210.66 3.080.51 6.57(Q0.43(L)  Sodium 135 - 145 mmol/L 139 137 140  Potassium 3.5 - 5.1 mmol/L 3.7 3.5 3.8  Chloride 96 - 112 mmol/L 102 105 110  CO2 19 - 32 mmol/L 28 26 25   Calcium 8.4 - 10.5 mg/dL 8.5 8.4 8.0(L)  Total Protein 6.0 - 8.3 g/dL - 5.4(L) -  Total Bilirubin 0.3 - 1.2 mg/dL - 0.4 -  Alkaline Phos 39 - 117 U/L - 63 -  AST 0 - 37 U/L - 38(H) -  ALT 0 - 35 U/L - 44(H) -    Condition on D/c:  Improved     Disposition: 01-Home or Self Care  Discharge Instructions    Change dressing    Complete by:  As directed   Do wet to dry dressing change twice a day at home.  You can shower and then put on new colostomy bag and clean dressing for the wound.            Medication List    STOP taking these medications        docusate sodium 50 MG capsule  Commonly known as:  COLACE      TAKE these medications        acetaminophen 325 MG tablet  Commonly known as:  TYLENOL  Take 2 tablets (650 mg total) by mouth every 6 (six) hours as needed.     ferrous sulfate 325 (65 FE) MG tablet  - Take one with supper to help build up her blood.  It can make you constipated.  - You can buy over the counter, and do not need a prescription.  Ask pharmacist to help you find this.     multivitamins with iron Tabs  tablet  Take 1 daily in AM with breakfast.  You can buy over the counter something like Women's One a day vitamin.     oxyCODONE-acetaminophen 5-325 MG per tablet  Commonly known as:  PERCOCET/ROXICET  Take 1-2 tablets by mouth every 4 (four) hours as needed for moderate pain.     pantoprazole 40 MG tablet  Commonly known as:  PROTONIX  Take 1 tablet (40 mg total) by mouth daily.     polyethylene glycol powder powder  Commonly known as:  GLYCOLAX/MIRALAX  Use if you have nothing from your colostomy in 24 hours.  If nothing after 36 hours call our office.       Follow-up Information    Follow up with [facility].   Why:  nurse to assist with ostomy care   Contact information:   [address] [place] [address] [phone]       Follow up with Melvia Heaps, MD.   Specialty:  Gastroenterology   Why:  As needed   Contact information:   [address] [place] [address] [phone]       Follow up with Tawni Carnes, MD.   Specialty:  Family Medicine   Why:  Call and let him know about being in hospital.  Follow up with him as needed.   Contact information:   [address] [place] [address] [phone]       Follow up with Vanita Panda., MD. Schedule an appointment as soon as possible for a visit in 2 weeks.   Specialty:  General Surgery   Contact information:   [address] [place] [address] [phone]       Signed: Sherrie George 02/27/2015, 3:47 PM

## 2015-02-24 NOTE — Progress Notes (Signed)
4 Days Post-Op  Subjective: Liquid stool, nothing solid from ostomy, has a full liquid tray, but eating mostly clears.  + BS.  Family is in the rooms and speak [place].    Objective: Vital signs in last 24 hours: Temp:  [98.6 F (37 C)-98.8 F (37.1 C)] 98.6 F (37 C) (04/04 0550) Pulse Rate:  [65-74] 69 (04/04 0550) Resp:  [18-24] 18 (04/04 0550) BP: (129-153)/(73-78) 133/75 mmHg (04/04 0550) SpO2:  [100 %] 100 % (04/04 0550) Last BM Date: 02/24/15  Intake/Output from previous day: 04/03 0701 - 04/04 0700 In: 2790 [P.O.:390; I.V.:2350; IV Piggyback:50] Out: 2410 [Urine:2075; Stool:335] Intake/Output this shift: Total I/O In: 400 [I.V.:400] Out: 625 [Urine:625]  General appearance: alert, cooperative and no distress Resp: clear to auscultation bilaterally GI: soft, few BS, wound OK liquid stool and gas in the ostomy.  Ostomy looks a little swollen, but working.  Lab Results:   Recent Labs  02/22/15 0510 02/23/15 0520  WBC 8.2 8.2  HGB 7.8* 7.4*  HCT 24.6* 23.6*  PLT 128* 131*    BMET  Recent Labs  02/22/15 0510 02/23/15 0520  NA 140 140  K 3.3* 3.8  CL 106 110  CO2 25 25  GLUCOSE 115* 117*  BUN 6 <5*  CREATININE 0.43* 0.43*  CALCIUM 7.5* 8.0*   PT/INR No results for input(s): LABPROT, INR in the last 72 hours.   Recent Labs Lab 02/20/15 1318  AST 15  ALT 12  ALKPHOS 107  BILITOT 1.2  PROT 7.6  ALBUMIN 4.7     Lipase  No results found for: LIPASE   Studies/Results: Dg Chest 2 View  02/24/2015   CLINICAL DATA:  Cough at night  EXAM: CHEST  2 VIEW  COMPARISON:  05/01/2014  FINDINGS: Cardiomediastinal silhouette is stable. Hyperinflation and mild interstitial prominence again noted. No acute infiltrate or pulmonary edema. Bilateral nodular nipple shadow. Bony thorax is unremarkable.  IMPRESSION: No active disease. Mild hyperinflation. Chronic mild interstitial prominence.   Electronically Signed   By: Natasha Mead M.D.   On: 02/24/2015 08:32     Medications: . antiseptic oral rinse  7 mL Mouth Rinse q12n4p  . cefOXitin  2 g Intravenous 3 times per day  . chlorhexidine  15 mL Mouth Rinse BID  . docusate sodium  100 mg Oral Daily  . enoxaparin (LOVENOX) injection  30 mg Subcutaneous Q24H  . polyethylene glycol  34 g Oral BID   . dextrose 5 % and 0.9 % NaCl with KCl 20 mEq/L 100 mL/hr at 02/23/15 2358   Prior to Admission medications   Medication Sig Start Date End Date Taking? Authorizing Provider  acetaminophen (TYLENOL) 500 MG tablet Take 1,000 mg by mouth every 6 (six) hours as needed for headache (headache).    Yes Historical Provider, MD  docusate sodium (COLACE) 50 MG capsule Take 2 capsules (100 mg total) by mouth daily. 12/19/14  Yes Tyrone Nine, MD  polyethylene glycol powder (GLYCOLAX/MIRALAX) powder Take 34 g by mouth 2 (two) times daily. Take as needed to produce 1 normal bowel movement per day. 12/19/14  Yes Tyrone Nine, MD     Assessment/Plan Perforated colon after screening colonoscopy 02/20/15, Dr. Arlyce Dice EXPLORATORY LAPAROTOMY WITH HARTMAN'S PROCEDURE, 02/20/15, Dr. Romie Levee Hx of constipation Lovenox/SCD for DVT prophylaxis   Plan:  I am going to leave her on full liquids, continue to mobilize.   I want to make sure she is having solid stool from the ostomy before we  up her diet and let her go home.  She should be ready to go home soon.      LOS: 4 days    Pernell Dikes 02/24/2015

## 2015-02-25 MED ORDER — OXYCODONE-ACETAMINOPHEN 5-325 MG PO TABS
1.0000 | ORAL_TABLET | ORAL | Status: DC | PRN
  Administered 2015-02-25 – 2015-02-27 (×3): 1 via ORAL
  Filled 2015-02-25 (×3): qty 1

## 2015-02-25 NOTE — Progress Notes (Signed)
5 Days Post-Op  Subjective: Not eating,  could not sleep because of cough, chest is clear.  Not much output from ostomy.   Objective: Vital signs in last 24 hours: Temp:  [98.8 F (37.1 C)-99.1 F (37.3 C)] 98.8 F (37.1 C) (04/05 0525) Pulse Rate:  [69-75] 74 (04/05 0525) Resp:  [16-18] 16 (04/05 0525) BP: (132-139)/(70-78) 139/75 mmHg (04/05 0525) SpO2:  [98 %-100 %] 98 % (04/05 0525) Last BM Date: 02/24/15 Soft Diet 480 PO recorded 225 from the ostomy Afebrile, VSS H/H is down CXR yesterday did not show any significant changes  Intake/Output from previous day: 04/04 0701 - 04/05 0700 In: 2880 [P.O.:480; I.V.:2400] Out: 2500 [Urine:2275; Stool:225] Intake/Output this shift:    General appearance: alert, cooperative and no distress Resp: clear to auscultation bilaterally GI: soft, few BS, wound OK, ostomy looks swollen, but gas and stool colored fluid from it.  Lab Results:   Recent Labs  02/23/15 0520  WBC 8.2  HGB 7.4*  HCT 23.6*  PLT 131*    BMET  Recent Labs  02/23/15 0520  NA 140  K 3.8  CL 110  CO2 25  GLUCOSE 117*  BUN <5*  CREATININE 0.43*  CALCIUM 8.0*   PT/INR No results for input(s): LABPROT, INR in the last 72 hours.   Recent Labs Lab 02/20/15 1318  AST 15  ALT 12  ALKPHOS 107  BILITOT 1.2  PROT 7.6  ALBUMIN 4.7     Lipase  No results found for: LIPASE   Studies/Results: Dg Chest 2 View  02/24/2015   CLINICAL DATA:  Cough at night  EXAM: CHEST  2 VIEW  COMPARISON:  05/01/2014  FINDINGS: Cardiomediastinal silhouette is stable. Hyperinflation and mild interstitial prominence again noted. No acute infiltrate or pulmonary edema. Bilateral nodular nipple shadow. Bony thorax is unremarkable.  IMPRESSION: No active disease. Mild hyperinflation. Chronic mild interstitial prominence.   Electronically Signed   By: Natasha MeadLiviu  Pop M.D.   On: 02/24/2015 08:32    Medications: . antiseptic oral rinse  7 mL Mouth Rinse q12n4p  . cefOXitin  2  g Intravenous 3 times per day  . chlorhexidine  15 mL Mouth Rinse BID  . docusate sodium  100 mg Oral Daily  . enoxaparin (LOVENOX) injection  30 mg Subcutaneous Q24H  . polyethylene glycol  34 g Oral BID    Assessment/Plan Perforated colon after screening colonoscopy 02/20/15, Dr. Arlyce DiceKaplan EXPLORATORY LAPAROTOMY WITH HARTMAN'S PROCEDURE, 02/20/15, Dr. Romie LeveeAlicia Thomas Hx of constipation Lovenox/SCD for DVT prophylaxis Day 6 Cefoxitin   Plan:  Decide on how long we want to keep her on antibiotics, I told her family to bring food from home for her.  I'm not sure why she is coughing. Watch her today and repeat labs in AM.  I will turn her IV down some and continue to mobilize.    LOS: 5 days    Ivan Maskell 02/25/2015

## 2015-02-25 NOTE — Consult Note (Signed)
   [facility]   02/25/2015  [name] [name] 15-May-1961 [id number]  Came to visit patient on behalf of [facility]/ [facility] Care Management services for [employer] employees/dependents with [facility] [employer] insurance. Explained program to patient and family member at bedside. Patient does not have any current Link to Wellness needs at this time. Will continue to follow. Left Link to Wellness brochure at bedside. Will make inpatient RNCM aware of visit.   Raiford NobleAtika Hall, MSN-Ed, RN,BSN [facility] Care Management Hospital Liaison [phone]

## 2015-02-25 NOTE — Consult Note (Signed)
WOC ostomy follow up Stoma type/location: LUQ Colostomy.  Son-in-law and two grandchildren present. Stomal assessment/size: 1 and 3/4 inches round, moist.  Os at center. Stoma edematous, slightly larger at dome than at base. Peristomal assessment: intact, clear Treatment options for stomal/peristomal skin: skin barrier ring Output liquid brown stool Ostomy pouching: 2pc. 2 and 1/4 inch pouching system with skin barrier ring Education provided: Patient able to give return demo of pouch opening and closing-fastening with [place] ReaperLock N' Roll closure. [place] in [place] [employer] Discharge program: Yes  WOC nursing team will follow, and will remain available to this patient, the nursing, surgical and medical teams.  We remain concerned that acceptance is a barrier to self care.  Today is the first sign of independence as she practiced performing the lock and roll closure.  I have case conferenced with the nursing tech and RN and they will ensure she practices emptying the pouch tomorrow.   Thanks, Ladona MowLaurie Idrees Quam, MSN, RN, GNP, [facility], CWON-AP [phone])

## 2015-02-26 ENCOUNTER — Other Ambulatory Visit (HOSPITAL_COMMUNITY): Payer: 59

## 2015-02-26 ENCOUNTER — Inpatient Hospital Stay (HOSPITAL_COMMUNITY): Payer: 59

## 2015-02-26 ENCOUNTER — Encounter (HOSPITAL_COMMUNITY): Payer: Self-pay | Admitting: Radiology

## 2015-02-26 LAB — CBC
HCT: 24.6 % — ABNORMAL LOW (ref 36.0–46.0)
Hemoglobin: 7.6 g/dL — ABNORMAL LOW (ref 12.0–15.0)
MCH: 21.8 pg — ABNORMAL LOW (ref 26.0–34.0)
MCHC: 30.9 g/dL (ref 30.0–36.0)
MCV: 70.5 fL — AB (ref 78.0–100.0)
Platelets: 194 10*3/uL (ref 150–400)
RBC: 3.49 MIL/uL — ABNORMAL LOW (ref 3.87–5.11)
RDW: 14.5 % (ref 11.5–15.5)
WBC: 5.5 10*3/uL (ref 4.0–10.5)

## 2015-02-26 LAB — COMPREHENSIVE METABOLIC PANEL
ALT: 44 U/L — ABNORMAL HIGH (ref 0–35)
ANION GAP: 6 (ref 5–15)
AST: 38 U/L — ABNORMAL HIGH (ref 0–37)
Albumin: 2.8 g/dL — ABNORMAL LOW (ref 3.5–5.2)
Alkaline Phosphatase: 63 U/L (ref 39–117)
BILIRUBIN TOTAL: 0.4 mg/dL (ref 0.3–1.2)
CO2: 26 mmol/L (ref 19–32)
CREATININE: 0.51 mg/dL (ref 0.50–1.10)
Calcium: 8.4 mg/dL (ref 8.4–10.5)
Chloride: 105 mmol/L (ref 96–112)
GFR calc Af Amer: >90 mL/min (ref >90)
GFR calc non Af Amer: >90 mL/min (ref >90)
Glucose, Bld: 90 mg/dL (ref 70–99)
Potassium: 3.5 mmol/L (ref 3.5–5.1)
Sodium: 137 mmol/L (ref 135–145)
TOTAL PROTEIN: 5.4 g/dL — AB (ref 6.0–8.3)

## 2015-02-26 MED ORDER — IOHEXOL 300 MG/ML  SOLN
80.0000 mL | Freq: Once | INTRAMUSCULAR | Status: AC | PRN
  Administered 2015-02-26: 80 mL via INTRAVENOUS

## 2015-02-26 MED ORDER — IOHEXOL 300 MG/ML  SOLN
25.0000 mL | INTRAMUSCULAR | Status: AC
  Administered 2015-02-26: 25 mL via ORAL

## 2015-02-26 MED ORDER — ENOXAPARIN SODIUM 30 MG/0.3ML ~~LOC~~ SOLN
20.0000 mg | SUBCUTANEOUS | Status: DC
  Administered 2015-02-27: 20 mg via SUBCUTANEOUS
  Filled 2015-02-26: qty 0.2

## 2015-02-26 MED ORDER — PANTOPRAZOLE SODIUM 40 MG IV SOLR
40.0000 mg | Freq: Two times a day (BID) | INTRAVENOUS | Status: DC
  Administered 2015-02-26 (×2): 40 mg via INTRAVENOUS
  Filled 2015-02-26 (×4): qty 40

## 2015-02-26 NOTE — Consult Note (Signed)
WOC ostomy consult note Stoma type/location: LUQ Colostomy, remains nauseated and unable to eat.  Daughter is present in room and translates for me today.  Stomal assessment/size: 1 3/4" round stoma.  Having loose, brown output.  She has emptied her pouch twice today and empties for me while I am here.  Patient relays that she getting more comfortable with this process.  States she is very nauseated and is uncomfortable.   Peristomal assessment: Not assessed.  Pouch intact Treatment options for stomal/peristomal skin: 2 1/4" pouch. Output loose brown stool Ostomy pouching: /2pc.  Education provided: Daughter in room and states patient is getting more comfortable with emptying.  Will continue to reinforce self care while here.  Will not follow at this time.  Please re-consult if needed.  Maple HudsonKaren Kaleeah Gingerich RN BSN CWON Pager [phone]

## 2015-02-26 NOTE — Progress Notes (Signed)
6 Days Post-Op  Subjective: She is doing better, but says it hurts to eat or drink anything.  She says after she swallows it hurts her stomach. Her cough is better and she slept better but still cannot eat or drink.  Objective: Vital signs in last 24 hours: Temp:  [98.6 F (37 C)-99.2 F (37.3 C)] 98.6 F (37 C) (04/06 0520) Pulse Rate:  [65-71] 68 (04/06 0520) Resp:  [14-16] 14 (04/06 0520) BP: (119-124)/(61-76) 121/61 mmHg (04/06 0520) SpO2:  [100 %] 100 % (04/06 0520) Last BM Date: 02/24/15 120 po RECORDED 150 from colostomy recorded Afebrile VSS Labs OK No films Intake/Output from previous day: 04/05 0701 - 04/06 0700 In: 1481.7 [P.O.:120; I.V.:1361.7] Out: 1700 [Urine:1550; Stool:150] Intake/Output this shift: Total I/O In: -  Out: 350 [Urine:350]  General appearance: alert, cooperative and no distress Resp: clear to auscultation bilaterally GI: soft, very sore, wound is almost healed, liquid stool coming from the ostomy, it looks swollen, + BS  Lab Results:   Recent Labs  02/26/15 0504  WBC 5.5  HGB 7.6*  HCT 24.6*  PLT 194    BMET  Recent Labs  02/26/15 0504  NA 137  K 3.5  CL 105  CO2 26  GLUCOSE 90  BUN <5*  CREATININE 0.51  CALCIUM 8.4   PT/INR No results for input(s): LABPROT, INR in the last 72 hours.   Recent Labs Lab 02/20/15 1318 02/26/15 0504  AST 15 38*  ALT 12 44*  ALKPHOS 107 63  BILITOT 1.2 0.4  PROT 7.6 5.4*  ALBUMIN 4.7 2.8*     Lipase  No results found for: LIPASE   Studies/Results: No results found.  Medications: . antiseptic oral rinse  7 mL Mouth Rinse q12n4p  . cefOXitin  2 g Intravenous 3 times per day  . chlorhexidine  15 mL Mouth Rinse BID  . docusate sodium  100 mg Oral Daily  . enoxaparin (LOVENOX) injection  30 mg Subcutaneous Q24H  . polyethylene glycol  34 g Oral BID    Assessment/Plan Perforated colon after screening colonoscopy 02/20/15, Dr. Arlyce DiceKaplan EXPLORATORY LAPAROTOMY WITH HARTMAN'S  PROCEDURE, 02/20/15, Dr. Romie LeveeAlicia Thomas Hx of constipation Lovenox/SCD for DVT prophylaxis Day 6 Cefoxitin Post op anemia   Plan:  I'm not sure what is going on with her, but I am going to put her on PPI and rescan her to be sure we are not missing something. If that fails I may ask GI to see again.  I don't think we can send her home if she cannot eat or drink comfortably.  Pharmacy is adjusting lovenox based on her weight also.    LOS: 6 days    Con Arganbright 02/26/2015

## 2015-02-27 DIAGNOSIS — R1013 Epigastric pain: Secondary | ICD-10-CM

## 2015-02-27 LAB — CBC
HCT: 24.8 % — ABNORMAL LOW (ref 36.0–46.0)
Hemoglobin: 7.8 g/dL — ABNORMAL LOW (ref 12.0–15.0)
MCH: 22 pg — AB (ref 26.0–34.0)
MCHC: 31.5 g/dL (ref 30.0–36.0)
MCV: 70.1 fL — AB (ref 78.0–100.0)
PLATELETS: 233 10*3/uL (ref 150–400)
RBC: 3.54 MIL/uL — ABNORMAL LOW (ref 3.87–5.11)
RDW: 14.4 % (ref 11.5–15.5)
WBC: 5.4 10*3/uL (ref 4.0–10.5)

## 2015-02-27 LAB — BASIC METABOLIC PANEL
Anion gap: 9 (ref 5–15)
CO2: 28 mmol/L (ref 19–32)
CREATININE: 0.66 mg/dL (ref 0.50–1.10)
Calcium: 8.5 mg/dL (ref 8.4–10.5)
Chloride: 102 mmol/L (ref 96–112)
GFR calc Af Amer: >90 mL/min (ref >90)
GLUCOSE: 98 mg/dL (ref 70–99)
POTASSIUM: 3.7 mmol/L (ref 3.5–5.1)
Sodium: 139 mmol/L (ref 135–145)

## 2015-02-27 MED ORDER — FERROUS SULFATE 325 (65 FE) MG PO TABS
325.0000 mg | ORAL_TABLET | Freq: Every day | ORAL | Status: DC
  Filled 2015-02-27: qty 1

## 2015-02-27 MED ORDER — OXYCODONE-ACETAMINOPHEN 5-325 MG PO TABS: 1.0000 | ORAL_TABLET | ORAL | Status: DC | PRN

## 2015-02-27 MED ORDER — FERROUS SULFATE 325 (65 FE) MG PO TABS: ORAL_TABLET | ORAL | Status: DC

## 2015-02-27 MED ORDER — ACETAMINOPHEN 325 MG PO TABS: 650.0000 mg | ORAL_TABLET | Freq: Four times a day (QID) | ORAL | Status: DC | PRN

## 2015-02-27 MED ORDER — PANTOPRAZOLE SODIUM 40 MG PO TBEC: 40.0000 mg | DELAYED_RELEASE_TABLET | Freq: Every day | ORAL | Status: DC

## 2015-02-27 MED ORDER — TAB-A-VITE/IRON PO TABS: ORAL_TABLET | ORAL | Status: DC

## 2015-02-27 MED ORDER — POLYETHYLENE GLYCOL 3350 17 GM/SCOOP PO POWD: ORAL | Status: DC

## 2015-02-27 MED ORDER — PANTOPRAZOLE SODIUM 40 MG PO TBEC
40.0000 mg | DELAYED_RELEASE_TABLET | Freq: Every day | ORAL | Status: DC
  Administered 2015-02-27: 40 mg via ORAL
  Filled 2015-02-27: qty 1

## 2015-02-27 MED ORDER — TAB-A-VITE/IRON PO TABS
1.0000 | ORAL_TABLET | Freq: Every day | ORAL | Status: DC
  Administered 2015-02-27: 1 via ORAL
  Filled 2015-02-27: qty 1

## 2015-02-27 NOTE — Progress Notes (Signed)
Patient able to demonstrate to nurse how to empty and clean her ostomy bag.

## 2015-02-27 NOTE — Discharge Instructions (Signed)
[facility]      [facility] °[phone] ° °OPEN ABDOMINAL SURGERY: POST OP INSTRUCTIONS ° °Always review your discharge instruction sheet given to you by the facility where your surgery was performed. ° °IF YOU HAVE DISABILITY OR FAMILY LEAVE FORMS, YOU MUST BRING THEM TO THE OFFICE FOR PROCESSING.  PLEASE DO NOT GIVE THEM TO YOUR DOCTOR. ° °1. A prescription for pain medication may be given to you upon discharge.  Take your pain medication as prescribed, if needed.  If narcotic pain medicine is not needed, then you may take acetaminophen (Tylenol) or ibuprofen (Advil) as needed. °2. Take your usually prescribed medications unless otherwise directed. °3. If you need a refill on your pain medication, please contact your pharmacy. They will contact our office to request authorization.  Prescriptions will not be filled after 5pm or on week-ends. °4. You should follow a light diet the first few days after arrival home, such as soup and crackers, pudding, etc.unless your doctor has advised otherwise. A high-fiber, low fat diet can be resumed as tolerated.   Be sure to include lots of fluids daily. Most patients will experience some swelling and bruising on the chest and neck area.  Ice packs will help.  Swelling and bruising can take several days to resolve °5. Most patients will experience some swelling and bruising in the area of the incision. Ice pack will help. Swelling and bruising can take several days to resolve..  °6. It is common to experience some constipation if taking pain medication after surgery.  Increasing fluid intake and taking a stool softener will usually help or prevent this problem from occurring.  A mild laxative (Milk of Magnesia or Miralax) should be taken according to package directions if there are no bowel movements after 48 hours. °7.  You may have steri-strips (small skin tapes) in place directly over the incision.  These strips should be left on the skin for 7-10 days.  If your  surgeon used skin glue on the incision, you may shower in 24 hours.  The glue will flake off over the next 2-3 weeks.  Any sutures or staples will be removed at the office during your follow-up visit. You may find that a light gauze bandage over your incision may keep your staples from being rubbed or pulled. You may shower and replace the bandage daily. °8. ACTIVITIES:  You may resume regular (light) daily activities beginning the next day--such as daily self-care, walking, climbing stairs--gradually increasing activities as tolerated.  You may have sexual intercourse when it is comfortable.  Refrain from any heavy lifting or straining until approved by your doctor. °a. You may drive when you no longer are taking prescription pain medication, you can comfortably wear a seatbelt, and you can safely maneuver your car and apply brakes °b. Return to Work: ___________________________________ °9. You should see your doctor in the office for a follow-up appointment approximately two weeks after your surgery.  Make sure that you call for this appointment within a day or two after you arrive home to insure a convenient appointment time. °OTHER INSTRUCTIONS:  °_____________________________________________________________ °_____________________________________________________________ ° °WHEN TO CALL YOUR DOCTOR: °1. Fever over 101.0 °2. Inability to urinate °3. Nausea and/or vomiting °4. Extreme swelling or bruising °5. Continued bleeding from incision. °6. Increased pain, redness, or drainage from the incision. °7. Difficulty swallowing or breathing °8. Muscle cramping or spasms. °9. Numbness or tingling in hands or feet or around lips. ° °The clinic staff is available to   answer your questions during regular business hours.  Please don’t hesitate to call and ask to speak to one of the nurses if you have concerns. ° °For further questions, please visit [email or web address] ° °Colostomy Home Guide °A colostomy is an  opening for stool to leave your body when a medical condition prevents it from leaving through the usual opening (rectum). During a surgery, a piece of large intestine (colon) is brought through a hole in the abdominal wall. The new opening is called a stoma or ostomy. A bag or pouch fits over the stoma to catch stool and gas. Your stool may be liquid, somewhat pasty, or formed. °CARING FOR YOUR STOMA  °Normally, the stoma looks a lot like the inside of your cheek: pink, red, and moist. At first it may be swollen, but this swelling will decrease within 6 weeks. °Keep the skin around your stoma clean and dry. You can gently wash your stoma and the skin around your stoma in the shower with a clean, soft washcloth. If you develop any skin irritation, your caregiver may give you a stoma powder or ointment to help heal the area. Do not use any products other than those specifically given to you by your caregiver.  °Your stoma should not be uncomfortable. If you notice any stinging or burning, your pouch may be leaking, and the skin around your stoma may be coming into contact with stool. This can cause skin irritation. If you notice stinging, replace your pouch with a new one and discard the old one. °OSTOMY POUCHES  °The pouch that fits over the ostomy can be made up of either 1 or 2 pieces. A one-piece pouch has a skin barrier piece and the pouch itself in one unit. A two-piece pouch has a skin barrier with a separate pouch that snaps on and off of the skin barrier. Either way, you should empty the pouch when it is only  to ½ full. Do not let more stool or gas build up. This could cause the pouch to leak. °Some ostomy bags have a built-in gas release valve. Ostomy deodorizer (5 drops) can be put into the pouch to prevent odor. Some people use ostomy lubricant drops inside the pouch to help the stool slide out of the bag more easily and completely.  °EMPTYING YOUR OSTOMY POUCH  °You may get lessons on how to empty your  pouch from a wound-ostomy nurse before you leave the hospital. Here are the basic steps: °· Wash your hands with soap and water. °· Sit far back on the toilet. °· Put several pieces of toilet paper into the toilet water. This will prevent splashing as you empty the stool into the toilet bowl. °· Unclip or unvelcro the tail end of the pouch. °· Unroll the tail and empty stool into the toilet. °· Clean the tail with toilet paper. °· Reroll the tail, and clip or velcro it closed. °· Wash your hands again. °CHANGING YOUR OSTOMY POUCH  °Change your ostomy pouch about every 3 to 4 days for the first 6 weeks, then every 5 to7 days. Always change the bag sooner if there is any leakage or you begin to notice any discomfort or irritation of the skin around the stoma. When possible, plan to change your ostomy pouch before eating or drinking as this will lessen the chance of stool coming out during the pouch change. A wound-ostomy nurse may teach you how to change your pouch before you leave the hospital. Here are   the basic steps:  Hoyle Barr out your supplies.  Wash your hands with soap and water.  Carefully remove the old pouch.  Wash the stoma and allow it to dry. Men may be advised to shave any hair around the stoma very carefully. This will make the adhesive stick better.  Use the stoma measuring guide that comes with your pouch set to decide what size hole you will need to cut in the skin barrier piece. Choose the smallest possible size that will hold the stoma but will not touch it.  Use the guide to trace the circle on the back of the skin barrier piece. Cut out the hole.  Hold the skin barrier piece over the stoma to make sure the hole is the correct size.  Remove the adhesive paper backing from the skin barrier piece.  Squeeze stoma paste around the opening of the skin barrier piece.  Clean and dry the skin around the stoma again.  Carefully fit the skin barrier piece over your stoma.  If you are  using a two-piece pouch, snap the pouch onto the skin barrier piece.  Close the tail of the pouch.  Put your hand over the top of the skin barrier piece to help warm it for about 5 minutes, so that it conforms to your body better.  Wash your hands again. DIET TIPS   Continue to follow your usual diet.  Drink about eight 8 oz glasses of water each day.  You can prevent gas by eating slowly and chewing your food thoroughly.  If you feel concerned that you have too much gas, you can cut back on gas-producing foods, such as:  Spicy foods.  Onions and garlic.  Cruciferous vegetables (cabbage, broccoli, cauliflower, Brussels sprouts).  Beans and legumes.  Some cheeses.  Eggs.  Fish.  Bubbly (carbonated) drinks.  Chewing gum. GENERAL TIPS   You can shower with or without the bag in place.  Always keep the bag on if you are bathing or swimming.  If your bag gets wet, you can dry it with a blow-dryer set to cool.  Avoid wearing tight clothing directly over your stoma so that it does not become irritated or bleed. Tight clothing can also prevent stool from draining into the pouch.  It is helpful to always have an extra skin barrier and pouch with you when traveling. Do not leave them anywhere too warm, as parts of them can melt.  Do not let your seat belt rest on your stoma. Try to keep the seat belt either above or below your stoma, or use a tiny pillow to cushion it.  You can still participate in sports, but you should avoid activities in which there is a risk of getting hit in the abdomen.  You can still have sex. It is a good idea to empty your pouch prior to sex. Some people and their partners feel very comfortable seeing the pouch during sex. Others choose to wear lingerie or a T-shirt that covers the device. SEEK IMMEDIATE MEDICAL CARE IF:  You notice a change in the size or color of the stoma, especially if it becomes very red, purple, black, or pale white.  You  have bloody stools or bleeding from the stoma.  You have abdominal pain, nausea, vomiting, or bloating.  There is anything unusual protruding from the stoma.  You have irritation or red skin around the stoma.  No stool is passing from the stoma.  You have diarrhea (requiring more frequent  than normal pouch emptying). Document Released: 11/11/2003 Document Revised: 01/31/2012 Document Reviewed: 04/07/2011 [facility] Patient Information 2015 [place]. This information is not intended to replace advice given to you by your health care provider. Make sure you discuss any questions you have with your health care provider.  Dressing Change A dressing is a material placed over wounds. It keeps the wound clean, dry, and protected from further injury.  BEFORE YOU BEGIN  Get your supplies together. Things you may need include:  Salt solution (saline).  Flexible gauze bandage.  Medicated cream.  Tape.  Gloves.  Belly (abdominal) pads.  Gauze squares.  Plastic bags.  Take pain medicine 30 minutes before the bandage change if you need it.  Take a shower before you do the first bandage change of the day. Put plastic wrap or a bag over the dressing. REMOVING YOUR OLD BANDAGE  Wash your hands with soap and water. Dry your hands with a clean towel.  Put on your gloves.  Remove any tape.  Remove the old bandage as told. If it sticks, put a small amount of warm water on it to loosen the bandage.  Remove any gauze or packing tape in your wound.  Take off your gloves.  Put the gloves, tape, gauze, or any packing tape in a plastic bag. CHANGING YOUR BANDAGE  Open the supplies.  Take the cap off the salt solution.  Open the gauze. Leave the gauze on the inside of the package.  Put on your gloves.  Clean your wound as told by your doctor.  Keep your wound dry if your doctor told you to do so.  Your doctor may tell you to do one or more of the following:  Pick up the  gauze. Pour the salt solution over the gauze. Squeeze out the extra salt solution.  Put medicated cream or other medicine on your wound.  Put solution soaked gauze only in your wound, not on the skin around it.  Pack your wound loosely.  Put dry gauze on your wound.  Put belly pads over the dry gauze if your bandages soak through.  Tape the bandage in place so it will not fall off. Do not wrap the tape all the way around your arm or leg.  Wrap the bandage with the flexible gauze bandage as told by your doctor.  Take off your gloves. Put them in the plastic bag with the old bandage. Tie the bag shut and throw it away.  Keep the bandage clean and dry.  Wash your hands. GET HELP RIGHT AWAY IF:   Your skin around the wound looks red.  Your wound feels more tender or sore.  You see yellowish-white fluid (pus) in the wound.  Your wound smells bad.  You have a fever.  Your skin around the wound has a red rash that itches and burns.  You see black or yellow skin in your wound that was not there before.  You feel sick to your stomach (nauseous), throw up (vomit), and feel very tired. Document Released: 02/04/2009 Document Revised: 03/25/2014 Document Reviewed: 09/19/2011 [facility] Patient Information 2015 [place], [place]. This information is not intended to replace advice given to you by your health care provider. Make sure you discuss any questions you have with your health care provider.

## 2015-02-27 NOTE — Progress Notes (Signed)
Discharged instructions went over with patient and daughter. Both verbalized understanding.C.Seri Kimmer,RN

## 2015-02-27 NOTE — Progress Notes (Signed)
7 Days Post-Op  Subjective: She says she feels better.  Pain better without cough.  Wound almost healed.  She is up and happy with grandchild in room.  Objective: Vital signs in last 24 hours: Temp:  [98.1 F (36.7 C)-98.9 F (37.2 C)] 98.9 F (37.2 C) (04/07 0457) Pulse Rate:  [58-74] 60 (04/07 0457) Resp:  [14-18] 14 (04/07 0457) BP: (116-141)/(59-85) 116/59 mmHg (04/07 0457) SpO2:  [100 %] 100 % (04/07 0457) Last BM Date: 02/27/15 Nothing PO recorded 225 from the ostomy Afebrile, VSS Labs OK CT scan shows:  Postsurgical change compatible with recent partial colectomy and left lower quadrant colostomy. There remain several foci of free extraluminal over the left pelvis and adjacent the rectosigmoid junction likely related to patient's recent surgery. Free fluid over the left pericolic gutter, left lower quadrant and presacral region. No definite abscess or evidence of bowel obstruction. Evidence of gallbladder wall thickening. Consider right upper quadrant ultrasound for better evaluation.  Intake/Output from previous day: 04/06 0701 - 04/07 0700 In: 1200 [I.V.:1200] Out: 2575 [Urine:2350; Stool:225] Intake/Output this shift: Total I/O In: -  Out: 100 [Stool:100]  General appearance: alert, cooperative and no distress Resp: clear to auscultation bilaterally GI: soft, no complaints of pain today, some fluid in the ostomy.  wound almost healed.  Lab Results:   Recent Labs  02/26/15 0504 02/27/15 0830  WBC 5.5 5.4  HGB 7.6* 7.8*  HCT 24.6* 24.8*  PLT 194 233    BMET  Recent Labs  02/26/15 0504  NA 137  K 3.5  CL 105  CO2 26  GLUCOSE 90  BUN <5*  CREATININE 0.51  CALCIUM 8.4   PT/INR No results for input(s): LABPROT, INR in the last 72 hours.   Recent Labs Lab 02/20/15 1318 02/26/15 0504  AST 15 38*  ALT 12 44*  ALKPHOS 107 63  BILITOT 1.2 0.4  PROT 7.6 5.4*  ALBUMIN 4.7 2.8*     Lipase  No results found for: LIPASE    Studies/Results: Ct Abdomen Pelvis W Contrast  02/26/2015   CLINICAL DATA:  Perforated sigmoid, colon perforation surgery 02/20/15, colostomy, hx of Hartman's procedure, pain near colostomy, nausea, constipation, bun 5 cr 0.6 02-26-15, pt. Unable to eat.  EXAM: CT ABDOMEN AND PELVIS WITH CONTRAST  TECHNIQUE: Multidetector CT imaging of the abdomen and pelvis was performed using the standard protocol following bolus administration of intravenous contrast.  CONTRAST:  80mL OMNIPAQUE IOHEXOL 300 MG/ML  SOLN  COMPARISON:  None.  FINDINGS: Lung bases are within normal.  Abdominal images demonstrate the liver, pancreas, adrenal glands and kidneys to be within normal. There is a densely calcified 2.1 cm focus over the spleen with adjacent 1.2 cm dense calcification along the superior medial border of the spleen. There is moderate calcified plaque over the abdominal aorta and iliac arteries. Appendix is not seen. There several dense calcifications within the mesentery over the left mid abdomen, periaortic region and possibly right lower quadrant.  There is evidence of gallbladder wall thickening. No definite cholelithiasis.  There is evidence of patient's recent left lower quadrant colostomy site which appears uncomplicated. There is a small fleck of free air over the upper abdomen anterior to the liver. There are several small collections of free air over the left lower quadrant and left pelvis likely related to patient's recent surgery. Conscious is present within the distal small bowel. There are no dilated small bowel loops. Air is present within the right colon and transverse colon. There  is evidence of patient's previous partial colectomy and Hartmann's pouch. There is mild free fluid over the left pericolic gutter/left lower quadrant in presacral region.  Pelvic images demonstrate a 2 cm focus of air which appears to be extraluminal adjacent to the rectosigmoid junction. Remaining pelvic structures are unremarkable.  There is mild degenerate change of the spine.  IMPRESSION: Postsurgical change compatible with recent partial colectomy and left lower quadrant colostomy. There remain several foci of free extraluminal over the left pelvis and adjacent the rectosigmoid junction likely related to patient's recent surgery. Free fluid over the left pericolic gutter, left lower quadrant and presacral region. No definite abscess or evidence of bowel obstruction.  Evidence of gallbladder wall thickening. Consider right upper quadrant ultrasound for better evaluation.   Electronically Signed   By: Elberta Fortisaniel  Boyle M.D.   On: 02/26/2015 17:45    Medications: . antiseptic oral rinse  7 mL Mouth Rinse q12n4p  . cefOXitin  2 g Intravenous 3 times per day  . chlorhexidine  15 mL Mouth Rinse BID  . docusate sodium  100 mg Oral Daily  . enoxaparin (LOVENOX) injection  20 mg Subcutaneous Q24H  . pantoprazole (PROTONIX) IV  40 mg Intravenous Q12H  . polyethylene glycol  34 g Oral BID    Assessment/Plan Perforated colon after screening colonoscopy 02/20/15, Dr. Arlyce DiceKaplan EXPLORATORY LAPAROTOMY WITH HARTMAN'S PROCEDURE, 02/20/15, Dr. Romie LeveeAlicia Thomas Hx of constipation Lovenox/SCD for DVT prophylaxis Day 6 Cefoxitin Post op anemia - stable    Plan:  Regular diet, I had seen Dr. Juanda ChanceBrodie and ask her to see earlier this Am before I saw pt.  I was concerned she might have and ulcer also with drop in H/H.   I will put her on PO Protonix also.  Add MVI and FeSO4 for anemia.  LOS: 7 days    [name] [name] 02/27/2015

## 2015-02-27 NOTE — Progress Notes (Signed)
Re-consult / Progress Note   Subjective  eating an egg sandwich. No abdominal pain.    Objective   Vital signs in last 24 hours: Temp:  [98.1 F (36.7 C)-98.9 F (37.2 C)] 98.9 F (37.2 C) (04/07 0457) Pulse Rate:  [58-74] 60 (04/07 0457) Resp:  [14-18] 14 (04/07 0457) BP: (116-141)/(59-85) 116/59 mmHg (04/07 0457) SpO2:  [100 %] 100 % (04/07 0457) Last BM Date: 02/27/15 General:    Asian female in NAD Heart:  Regular rate and rhythm Abdomen:  Soft, nontender and nondistended. Normal bowel sounds. Extremities:  Without edema. Neurologic:  Alert and oriented,  grossly normal neurologically. Psych:  Cooperative. Normal mood and affect.  Lab Results:  Recent Labs  02/26/15 0504 02/27/15 0830  WBC 5.5 5.4  HGB 7.6* 7.8*  HCT 24.6* 24.8*  PLT 194 233   BMET  Recent Labs  02/26/15 0504 02/27/15 0830  NA 137 139  K 3.5 3.7  CL 105 102  CO2 26 28  GLUCOSE 90 98  BUN <5* <5*  CREATININE 0.51 0.66  CALCIUM 8.4 8.5   LFT  Recent Labs  02/26/15 0504  PROT 5.4*  ALBUMIN 2.8*  AST 38*  ALT 44*  ALKPHOS 63  BILITOT 0.4    Studies/Results: Ct Abdomen Pelvis W Contrast  02/26/2015   CLINICAL DATA:  Perforated sigmoid, colon perforation surgery 02/20/15, colostomy, hx of Hartman's procedure, pain near colostomy, nausea, constipation, bun 5 cr 0.6 02-26-15, pt. Unable to eat.  EXAM: CT ABDOMEN AND PELVIS WITH CONTRAST  TECHNIQUE: Multidetector CT imaging of the abdomen and pelvis was performed using the standard protocol following bolus administration of intravenous contrast.  CONTRAST:  80mL OMNIPAQUE IOHEXOL 300 MG/ML  SOLN  COMPARISON:  None.  FINDINGS: Lung bases are within normal.  Abdominal images demonstrate the liver, pancreas, adrenal glands and kidneys to be within normal. There is a densely calcified 2.1 cm focus over the spleen with adjacent 1.2 cm dense calcification along the superior medial border of the spleen. There is moderate calcified plaque over the  abdominal aorta and iliac arteries. Appendix is not seen. There several dense calcifications within the mesentery over the left mid abdomen, periaortic region and possibly right lower quadrant.  There is evidence of gallbladder wall thickening. No definite cholelithiasis.  There is evidence of patient's recent left lower quadrant colostomy site which appears uncomplicated. There is a small fleck of free air over the upper abdomen anterior to the liver. There are several small collections of free air over the left lower quadrant and left pelvis likely related to patient's recent surgery. Conscious is present within the distal small bowel. There are no dilated small bowel loops. Air is present within the right colon and transverse colon. There is evidence of patient's previous partial colectomy and Hartmann's pouch. There is mild free fluid over the left pericolic gutter/left lower quadrant in presacral region.  Pelvic images demonstrate a 2 cm focus of air which appears to be extraluminal adjacent to the rectosigmoid junction. Remaining pelvic structures are unremarkable. There is mild degenerate change of the spine.  IMPRESSION: Postsurgical change compatible with recent partial colectomy and left lower quadrant colostomy. There remain several foci of free extraluminal over the left pelvis and adjacent the rectosigmoid junction likely related to patient's recent surgery. Free fluid over the left pericolic gutter, left lower quadrant and presacral region. No definite abscess or evidence of bowel obstruction.  Evidence of gallbladder wall thickening. Consider right upper quadrant ultrasound for better  evaluation.   Electronically Signed   By: Elberta Fortis M.D.   On: 02/26/2015 17:45      Assessment / Plan:   86. 54 year old Asian female who underwent exp laparotomy with Hartman's procedure 02/20/15 after sigmoid perforation.  She complained of abdominal pain with meals yesterday. CTscan showed postsurgical changes  and gallbladder thickening. I am getting conflicting details. Daughter translating today and states patient ate broccoli yesterday without any problems and today she is currently tolerating an egg sandwich. Will see how she does after breakfast is complete.   2. Post-op anemia. Hgb fell from 13 to 9.6 post-operatively 02/21/15,  It declined further to 7.8 the following day but has remained in this range for last 4 days. No overt bleeding. No blood in ostomy bag per RN.  Surgery treating empirically with PPI and has added oral iron as well.  No plan for an EGD for now    LOS: 7 days   Willette Cluster  02/27/2015, 10:22 AM  Attending MD note:   I have taken a history, examined the patient, and reviewed the chart. I agree with the Advanced Practitioner's impression and recommendations. She tells me there are no longer any GI problems . Will be happy to see again.   [facility] Pager # [phone]

## 2015-03-02 ENCOUNTER — Encounter: Payer: Self-pay | Admitting: Family Medicine

## 2015-03-02 DIAGNOSIS — Z933 Colostomy status: Secondary | ICD-10-CM

## 2015-03-02 HISTORY — DX: Colostomy status: Z93.3

## 2015-04-07 ENCOUNTER — Other Ambulatory Visit: Payer: Self-pay | Admitting: General Surgery

## 2015-05-30 ENCOUNTER — Other Ambulatory Visit: Payer: Self-pay | Admitting: General Surgery

## 2015-05-30 ENCOUNTER — Ambulatory Visit (HOSPITAL_COMMUNITY)
Admission: RE | Admit: 2015-05-30 | Discharge: 2015-05-30 | Disposition: A | Discharge status: Home / Self Care | Payer: 59 | Source: Ambulatory Visit | Attending: General Surgery | Admitting: General Surgery

## 2015-05-30 ENCOUNTER — Encounter (HOSPITAL_COMMUNITY)
Admission: RE | Disposition: A | Discharge status: Home / Self Care | Payer: Self-pay | Source: Ambulatory Visit | Attending: General Surgery

## 2015-05-30 DIAGNOSIS — K9403 Colostomy malfunction: Secondary | ICD-10-CM | POA: Diagnosis not present

## 2015-05-30 DIAGNOSIS — Z1211 Encounter for screening for malignant neoplasm of colon: Secondary | ICD-10-CM | POA: Insufficient documentation

## 2015-05-30 HISTORY — PX: COLONOSCOPY: SHX5424

## 2015-05-30 SURGERY — COLONOSCOPY
Anesthesia: Moderate Sedation

## 2015-05-30 MED ORDER — FENTANYL CITRATE (PF) 100 MCG/2ML IJ SOLN
INTRAMUSCULAR | Status: DC | PRN
  Administered 2015-05-30 (×3): 12.5 ug via INTRAVENOUS

## 2015-05-30 MED ORDER — MIDAZOLAM HCL 5 MG/5ML IJ SOLN
INTRAMUSCULAR | Status: DC | PRN
  Administered 2015-05-30 (×3): 1 mg via INTRAVENOUS

## 2015-05-30 MED ORDER — MIDAZOLAM HCL 5 MG/ML IJ SOLN
INTRAMUSCULAR | Status: AC
  Filled 2015-05-30: qty 2

## 2015-05-30 MED ORDER — SODIUM CHLORIDE 0.9 % IV SOLN
INTRAVENOUS | Status: DC
  Administered 2015-05-30: 500 mL via INTRAVENOUS

## 2015-05-30 MED ORDER — FENTANYL CITRATE (PF) 100 MCG/2ML IJ SOLN
INTRAMUSCULAR | Status: AC
  Filled 2015-05-30: qty 2

## 2015-05-30 NOTE — H&P (Signed)
  HPI: [name] [name] is a 54 year old Montagnard speaking female who experienced a colon perforation during a screening colonoscopy. She underwent a colostomy due to a difficult resection.  She is here today to complete her colonoscopy.    Daughter is present and served as a Nurse, learning disabilitytranslator. The patient declined to have a telephone interpreter. Past Medical History  Diagnosis Date  . Constipation     Past Surgical History  Procedure Laterality Date  . Cesarean section  1992  . Appendectomy  1993  . Dilation and curettage of uterus  1990    "miscarriage"    Family History  Problem Relation Age of Onset  . Colon cancer Neg Hx          No Known Allergies   Review of Systems - General ROS: negative for - chills or fever Respiratory ROS: no cough, shortness of breath, or wheezing Cardiovascular ROS: no chest pain or dyspnea on exertion Gastrointestinal ROS: no abdominal pain, change in bowel habits, or black or bloody stools Genito-Urinary ROS: no dysuria, trouble voiding, or hematuria Musculoskeletal ROS: negative for - joint pain or muscular weakness  Temp(Src) 97.7 F (36.5 C)  Physical Exam  Constitutional: She is oriented to person, place, and time. She appears well-developed and well-nourished. No distress.  Petite  Cardiovascular: Normal rate, regular rhythm, normal heart sounds and intact distal pulses. Exam reveals no gallop and no friction rub.  No murmur heard. Respiratory: Effort normal and breath sounds normal. No respiratory distress. She has no wheezes. She has no rales. She exhibits no tenderness.  GI: Soft. Bowel sounds are normal. She exhibits no distension and no mass. Midline scar  Musculoskeletal: Normal range of motion. She exhibits no edema or tenderness.  Neurological: She is alert and oriented to person, place, and time.  Skin: Skin is warm and dry. No rash noted. She is not diaphoretic. No erythema. No pallor.   Psychiatric: She has a normal mood and affect. Her behavior is normal. Judgment and thought content normal.     Assessment/Plan Colon perforation s/p colonoscopy  -Pt now here to complete colonoscopy prior to reversing colostomy.  She is very aware of the risks of bleeding and perforation as well as risks associated with sedation.  I believe she understands these risks and has agreed to proceed.

## 2015-05-30 NOTE — Discharge Instructions (Signed)
Our office will call you to reschedule the procedure.  We will mail you a new packet of information on how to do the bowel prep.  Call the office if you have any questions about this.       Colonoscopy, Care After Refer to this sheet in the next few weeks. These instructions provide you with information on caring for yourself after your procedure. Your health care provider may also give you more specific instructions. Your treatment has been planned according to current medical practices, but problems sometimes occur. Call your health care provider if you have any problems or questions after your procedure. WHAT TO EXPECT AFTER THE PROCEDURE  After your procedure, it is typical to have the following:  A small amount of blood in your stool.  Moderate amounts of gas and mild abdominal cramping or bloating. HOME CARE INSTRUCTIONS  Do not drive, operate machinery, or sign important documents for 24 hours.  You may shower and resume your regular physical activities, but move at a slower pace for the first 24 hours.  Take frequent rest periods for the first 24 hours.  Walk around or put a warm pack on your abdomen to help reduce abdominal cramping and bloating.  Drink enough fluids to keep your urine clear or pale yellow.  You may resume your normal diet as instructed by your health care provider. Avoid heavy or fried foods that are hard to digest.  Avoid drinking alcohol for 24 hours or as instructed by your health care provider.  Only take over-the-counter or prescription medicines as directed by your health care provider.  If a tissue sample (biopsy) was taken during your procedure:  Do not take aspirin or blood thinners for 7 days, or as instructed by your health care provider.  Do not drink alcohol for 7 days, or as instructed by your health care provider.  Eat soft foods for the first 24 hours. SEEK MEDICAL CARE IF: You have persistent spotting of blood in your stool 2-3 days  after the procedure. SEEK IMMEDIATE MEDICAL CARE IF:  You have more than a small spotting of blood in your stool.  You pass large blood clots in your stool.  Your abdomen is swollen (distended).  You have nausea or vomiting.  You have a fever.  You have increasing abdominal pain that is not relieved with medicine. Document Released: 06/22/2004 Document Revised: 08/29/2013 Document Reviewed: 07/16/2013 [facility] Patient Information 2015 [place], [place]. This information is not intended to replace advice given to you by your health care provider. Make sure you discuss any questions you have with your health care provider.

## 2015-05-30 NOTE — Op Note (Signed)
05/30/2015  8:02 AM  PATIENT:  [name] [name]  54 y.o. female  Patient Care Team: Nani RavensAndrew M Wight, MD as PCP - General   PROCEDURE:  Procedure(s): COLONOSCOPY (aborted)  Dilation of ostomy stricture.    Surgeon(s): Romie LeveeAlicia Kvion Shapley, MD  ASSISTANT: none   ANESTHESIA:   sedation  PATIENT DISPOSITION:  home  INDICATION: 54 y.o. s/p perforated colon during colonoscopy.  Here for screening exam.  Patient interviewed prior to exam and stated bowel prep was completed.    FINDINGS: un-prepped colon  DESCRIPTION: the patient was identified in the preoperative holding area and taken to the endoscopy suite where they were laid supine on the bed.  Moderate sedation anesthesia was induced without difficulty.    A surgical timeout was performed indicating the correct patient, procedure and positioning.   I placed my index finger through the colostomy and dilated this gently with my finger, as there was a slight stricture.  I then introduced the pediatric colonoscope without difficulty.  I encountered a large amount of hard stool within the descending colon.  There was no way to complete the procedure due to un-prepped colon.  The exam was aborted and the scope was withdrawn.  I dilated the ostomy once more after digitally.  The patient was awakened and sent to the recovery area in stable condition.

## 2015-06-03 ENCOUNTER — Encounter (HOSPITAL_COMMUNITY): Payer: Self-pay | Admitting: General Surgery

## 2015-06-11 ENCOUNTER — Encounter (HOSPITAL_COMMUNITY): Payer: Self-pay | Admitting: *Deleted

## 2015-06-12 ENCOUNTER — Encounter (HOSPITAL_COMMUNITY): Payer: Self-pay | Admitting: Certified Registered Nurse Anesthetist

## 2015-06-12 ENCOUNTER — Ambulatory Visit (HOSPITAL_COMMUNITY): Payer: 59 | Admitting: Certified Registered Nurse Anesthetist

## 2015-06-12 ENCOUNTER — Ambulatory Visit (HOSPITAL_COMMUNITY)
Admission: RE | Admit: 2015-06-12 | Discharge: 2015-06-12 | Disposition: A | Discharge status: Home / Self Care | Payer: 59 | Source: Ambulatory Visit | Attending: General Surgery | Admitting: General Surgery

## 2015-06-12 ENCOUNTER — Encounter (HOSPITAL_COMMUNITY)
Admission: RE | Disposition: A | Discharge status: Home / Self Care | Payer: Self-pay | Source: Ambulatory Visit | Attending: General Surgery

## 2015-06-12 DIAGNOSIS — K9403 Colostomy malfunction: Secondary | ICD-10-CM | POA: Diagnosis not present

## 2015-06-12 DIAGNOSIS — Z1211 Encounter for screening for malignant neoplasm of colon: Secondary | ICD-10-CM | POA: Insufficient documentation

## 2015-06-12 DIAGNOSIS — F17200 Nicotine dependence, unspecified, uncomplicated: Secondary | ICD-10-CM | POA: Insufficient documentation

## 2015-06-12 HISTORY — PX: COLONOSCOPY WITH PROPOFOL: SHX5780

## 2015-06-12 SURGERY — COLONOSCOPY WITH PROPOFOL
Anesthesia: Monitor Anesthesia Care

## 2015-06-12 MED ORDER — SODIUM CHLORIDE 0.9 % IV SOLN: INTRAVENOUS | Status: DC

## 2015-06-12 MED ORDER — PROPOFOL 10 MG/ML IV BOLUS
INTRAVENOUS | Status: AC
  Filled 2015-06-12: qty 20

## 2015-06-12 MED ORDER — METOPROLOL TARTRATE 1 MG/ML IV SOLN
INTRAVENOUS | Status: DC | PRN
  Administered 2015-06-12 (×2): 1 mg via INTRAVENOUS

## 2015-06-12 MED ORDER — LIDOCAINE HCL (CARDIAC) 20 MG/ML IV SOLN
INTRAVENOUS | Status: DC | PRN
  Administered 2015-06-12: 30 mg via INTRAVENOUS

## 2015-06-12 MED ORDER — METOPROLOL TARTRATE 1 MG/ML IV SOLN
INTRAVENOUS | Status: AC
  Filled 2015-06-12: qty 5

## 2015-06-12 MED ORDER — LIDOCAINE HCL (CARDIAC) 20 MG/ML IV SOLN
INTRAVENOUS | Status: AC
  Filled 2015-06-12: qty 5

## 2015-06-12 MED ORDER — PROPOFOL INFUSION 10 MG/ML OPTIME
INTRAVENOUS | Status: DC | PRN
Start: 2015-06-12 — End: 2015-06-12
  Administered 2015-06-12: 200 ug/kg/min via INTRAVENOUS

## 2015-06-12 MED ORDER — LACTATED RINGERS IV SOLN
INTRAVENOUS | Status: DC | PRN
  Administered 2015-06-12: 08:00:00 via INTRAVENOUS

## 2015-06-12 MED ORDER — PROPOFOL 10 MG/ML IV BOLUS
INTRAVENOUS | Status: DC | PRN
  Administered 2015-06-12: 20 mg via INTRAVENOUS
  Administered 2015-06-12: 10 mg via INTRAVENOUS
  Administered 2015-06-12 (×2): 20 mg via INTRAVENOUS
  Administered 2015-06-12: 30 mg via INTRAVENOUS
  Administered 2015-06-12: 20 mg via INTRAVENOUS
  Administered 2015-06-12: 10 mg via INTRAVENOUS
  Administered 2015-06-12: 20 mg via INTRAVENOUS
  Administered 2015-06-12: 40 mg via INTRAVENOUS

## 2015-06-12 NOTE — OR Nursing (Signed)
In addition to colonoscopy, Dr. Maisie Fus placed several sutures in patients ostomy and replaced the ostomy bag. Pt. Tolerated both procedures well.  Omelia Blackwater, RN

## 2015-06-12 NOTE — H&P (View-Only) (Signed)
  HPI: [name] [name] is a 54 year old Montagnard speaking female who experienced a colon perforation during a screening colonoscopy. She underwent a colostomy due to a difficult resection.  She is here today to complete her colonoscopy.    Daughter is present and served as a translator. The patient declined to have a telephone interpreter. Past Medical History  Diagnosis Date  . Constipation     Past Surgical History  Procedure Laterality Date  . Cesarean section  1992  . Appendectomy  1993  . Dilation and curettage of uterus  1990    "miscarriage"    Family History  Problem Relation Age of Onset  . Colon cancer Neg Hx          No Known Allergies   Review of Systems - General ROS: negative for - chills or fever Respiratory ROS: no cough, shortness of breath, or wheezing Cardiovascular ROS: no chest pain or dyspnea on exertion Gastrointestinal ROS: no abdominal pain, change in bowel habits, or black or bloody stools Genito-Urinary ROS: no dysuria, trouble voiding, or hematuria Musculoskeletal ROS: negative for - joint pain or muscular weakness  Temp(Src) 97.7 F (36.5 C)  Physical Exam  Constitutional: She is oriented to person, place, and time. She appears well-developed and well-nourished. No distress.  Petite  Cardiovascular: Normal rate, regular rhythm, normal heart sounds and intact distal pulses. Exam reveals no gallop and no friction rub.  No murmur heard. Respiratory: Effort normal and breath sounds normal. No respiratory distress. She has no wheezes. She has no rales. She exhibits no tenderness.  GI: Soft. Bowel sounds are normal. She exhibits no distension and no mass. Midline scar  Musculoskeletal: Normal range of motion. She exhibits no edema or tenderness.  Neurological: She is alert and oriented to person, place, and time.  Skin: Skin is warm and dry. No rash noted. She is not diaphoretic. No erythema. No pallor.   Psychiatric: She has a normal mood and affect. Her behavior is normal. Judgment and thought content normal.     Assessment/Plan Colon perforation s/p colonoscopy  -Pt now here to complete colonoscopy prior to reversing colostomy.  She is very aware of the risks of bleeding and perforation as well as risks associated with sedation.  I believe she understands these risks and has agreed to proceed.         

## 2015-06-12 NOTE — Discharge Instructions (Signed)
Post Colonoscopy Instructions ° °1. DIET: Follow a light bland diet the first 24 hours after arrival home, such as soup, liquids, crackers, etc.  Be sure to include lots of fluids daily.  Avoid fast food or heavy meals as your are more likely to get nauseated.   °2. You may have some mild rectal bleeding for the first few days after the procedure.  This should get less and less with time.  Resume any blood thinners 2 days after your procedure unless directed otherwise by your physician. °3. Take your usually prescribed home medications unless otherwise directed. °a. If you have any pain, it is helpful to get up and walk around, as it is usually from excess gas. °b. If this is not helpful, you can take an over-the-counter pain medication.  Choose one of the following that works best for you: °i. Naproxen (Aleve, etc)  Two 220mg tabs twice a day °ii. Ibuprofen (Advil, etc) Three 200mg tabs four times a day (every meal & bedtime) °iii. If you still have pain after using one of these, please call the office °4. It is normal to not have a bowel movement for 2-3 days after colonoscopy.   ° °5. ACTIVITIES as tolerated:   °6. You may resume regular (light) daily activities beginning the next day--such as daily self-care, walking, climbing stairs--gradually increasing activities as tolerated.  ° ° °WHEN TO CALL US [phone]: °1. Fever over 101.5 F (38.5 C)  °2. Severe abdominal or chest pain  °3. Large amount of rectal bleeding, passing multiple blood clots  °4. Dizziness or shortness of breath °5. Increasing nausea or vomiting ° ° The clinic staff is available to answer your questions during regular business hours (8:30am-5pm).  Please don’t hesitate to call and ask to speak to one of our nurses for clinical concerns.  ° If you have a medical emergency, go to the nearest emergency room or call 911. ° A surgeon from [facility] is always on call at the hospitals ° ° °[facility] °[address], [address], [place], [place]  [address] ? °MAIN: [phone] ? TOLL FREE: [phone] ?  °FAX [phone] °[email or web address] ° ° °

## 2015-06-12 NOTE — Op Note (Addendum)
[facility] [address] [place], [address]   OPERATIVE PROCEDURE REPORT  PATIENT: [name] [name], [name] [name]  MR#: [id number] BIRTHDATE: Aug 24, 1961 GENDER: female ENDOSCOPIST: Vanita Panda, MD ASSISTANT:   Epimenio Sarin PROCEDURE DATE: 06-15-2015 PRE-PROCEDURE PREPARATION: Miralax was taken with 3 dulcolax tabs. PRE-PROCEDURE PHYSICAL: Patient has stable vital signs.  Neck is supple.  There is no JVD, thyromegaly or LAD.  Chest clear to auscultation.  S1 and S2 regular.  Abdomen soft, non-distended, non-tender with NABS. PROCEDURE:     Colonoscopy, screening ASA CLASS: INDICATIONS:     1.  average risk patient for colon cancer. MEDICATIONS:     Per Anesthesia  DESCRIPTION OF PROCEDURE:   After the risks, benefits, and alternatives of the procedure were thoroughly explained [including a 10% missed rate of cancer and polyps], informed consent was obtained.  Digital rectal exam was performed.  The Pentax Ped Colon [id number]  was introduced through the anus  and advanced to the cecum, which was identified by both the appendix and ileocecal valve , limited by No adverse events experienced.   The quality of the prep was good. . Multiple washes were done. Small lesions could be missed. The instrument was then slowly withdrawn as the colon was fully examined. Estimated blood loss is zero unless otherwise noted in this procedure report.   COLON FINDINGS: A normal appearing cecum, ileocecal valve, and appendiceal orifice were identified.  The ascending, transverse, descending, sigmoid colon, and rectum appeared unremarkable. Colostomy stricture noted.   The entire colonic mucosa appeared healthy with a normal vascular pattern.  No masses, polyps, diverticula or AVMs were noted.  The appendiceal orifice and the ICV were identified.  Retroflexed views revealed no abnormalities. The patient tolerated the procedure without immediate complications.  The scope was  then withdrawn from the patient and the procedure terminated.  TIME TO CECUM:  9 minutes 0 seconds WITHDRAW TIME:  8 minutes 0 seconds  IMPRESSION:     Normal colonoscopy  RECOMMENDATIONS:     Co  Ok to proceed with surgery  REPEAT EXAM:      In 10 years  for Colonoscopy.  If the patient has any abnormal GI symptoms in the interim, she/he have been advised to contact the office as soon as possible for further recommendations.   REFERRED BY:  eSigned:  Vanita Panda, MD 2015/06/15 9:32 AM Revised: 06-15-15 9:32 AM  CPT CODES:     62130 Colonoscopy through stoma; diagnostic, with or without collection of specimen(s) by brushing or washing (separate procedure) ICD CODES:  The ICD and CPT codes recommended by this software are interpretations from the data that the clinical staff has captured with the software.  The verification of the translation of this report to the ICD and CPT codes and modifiers is the sole responsibility of the health care institution and practicing physician where this report was generated.  [employer] will not be held responsible for the validity of the ICD and CPT codes included on this report.  AMA assumes no liability for data contained or not contained herein. CPT is a Publishing rights manager of the [employer].

## 2015-06-12 NOTE — Transfer of Care (Signed)
Immediate Anesthesia Transfer of Care Note  Patient: [name] [name]  Procedure(s) Performed: Procedure(s): COLONOSCOPY WITH PROPOFOL (N/A)  Patient Location: Endo recovery  Anesthesia Type:MAC  Level of Consciousness: Patient easily awoken, sedated, comfortable, cooperative, following commands, responds to stimulation.   Airway & Oxygen Therapy: Patient spontaneously breathing, ventilating well, oxygen via simple oxygen mask.  Post-op Assessment: Report given to PACU RN, vital signs reviewed and stable, moving all extremities.   Post vital signs: Reviewed and stable.  Complications: No apparent anesthesia complications

## 2015-06-12 NOTE — Interval H&P Note (Signed)
History and Physical Interval Note:  06/12/2015 8:07 AM  [name] [name]  has presented today for surgery, with the diagnosis of screening  The various methods of treatment have been discussed with the patient and family. After consideration of risks, benefits and other options for treatment, the patient has consented to  Procedure(s): COLONOSCOPY WITH PROPOFOL (N/A) as a surgical intervention .  The patient's history has been reviewed, patient examined, no change in status, stable for surgery.  I have reviewed the patient's chart and labs.  Questions were answered to the patient's satisfaction.   Risks include bleeding and perforation.   Vanita Panda, MD  Colorectal and General Surgery [facility]

## 2015-06-12 NOTE — Anesthesia Preprocedure Evaluation (Signed)
Anesthesia Evaluation  Patient identified by MRN, date of birth, ID band Patient awake    Reviewed: Allergy & Precautions, NPO status , Patient's Chart, lab work & pertinent test results  History of Anesthesia Complications Negative for: history of anesthetic complications  Airway Mallampati: II  TM Distance: >3 FB Neck ROM: Full    Dental no notable dental hx. (+) Dental Advisory Given   Pulmonary Current Smoker,  breath sounds clear to auscultation  Pulmonary exam normal       Cardiovascular negative cardio ROS Normal cardiovascular examRhythm:Regular Rate:Normal     Neuro/Psych negative neurological ROS  negative psych ROS   GI/Hepatic negative GI ROS, Neg liver ROS,   Endo/Other  negative endocrine ROS  Renal/GU negative Renal ROS  negative genitourinary   Musculoskeletal negative musculoskeletal ROS (+)   Abdominal   Peds negative pediatric ROS (+)  Hematology negative hematology ROS (+)   Anesthesia Other Findings   Reproductive/Obstetrics negative OB ROS                             Anesthesia Physical  Anesthesia Plan  ASA: II  Anesthesia Plan: MAC   Post-op Pain Management:    Induction:   Airway Management Planned: Simple Face Mask  Additional Equipment:   Intra-op Plan:   Post-operative Plan:   Informed Consent: I have reviewed the patients History and Physical, chart, labs and discussed the procedure including the risks, benefits and alternatives for the proposed anesthesia with the patient or authorized representative who has indicated his/her understanding and acceptance.   Dental advisory given  Plan Discussed with: CRNA  Anesthesia Plan Comments: (Interpreter for consent and history)        Anesthesia Quick Evaluation

## 2015-06-12 NOTE — Anesthesia Postprocedure Evaluation (Signed)
  Anesthesia Post-op Note  Patient: [name] [name]  Procedure(s) Performed: Procedure(s) (LRB): COLONOSCOPY WITH PROPOFOL (N/A)  Patient Location: PACU  Anesthesia Type: MAC  Level of Consciousness: awake and alert   Airway and Oxygen Therapy: Patient Spontanous Breathing  Post-op Pain: mild  Post-op Assessment: Post-op Vital signs reviewed, Patient's Cardiovascular Status Stable, Respiratory Function Stable, Patent Airway and No signs of Nausea or vomiting  Last Vitals:  Filed Vitals:   06/12/15 0940  BP: 140/77  Pulse: 55  Temp:   Resp: 12    Post-op Vital Signs: stable   Complications: No apparent anesthesia complications

## 2015-06-13 ENCOUNTER — Encounter (HOSPITAL_COMMUNITY): Payer: Self-pay | Admitting: General Surgery

## 2015-06-25 ENCOUNTER — Encounter (HOSPITAL_COMMUNITY): Payer: Self-pay

## 2015-06-27 ENCOUNTER — Encounter (HOSPITAL_COMMUNITY): Payer: Self-pay

## 2015-06-27 ENCOUNTER — Encounter (HOSPITAL_COMMUNITY)
Admission: RE | Admit: 2015-06-27 | Discharge: 2015-06-27 | Disposition: A | Discharge status: Home / Self Care | Payer: 59 | Source: Ambulatory Visit | Attending: General Surgery | Admitting: General Surgery

## 2015-06-27 DIAGNOSIS — Z01818 Encounter for other preprocedural examination: Secondary | ICD-10-CM | POA: Diagnosis not present

## 2015-06-27 DIAGNOSIS — K631 Perforation of intestine (nontraumatic): Secondary | ICD-10-CM | POA: Diagnosis not present

## 2015-06-27 HISTORY — DX: Sleep disorder, unspecified: G47.9

## 2015-06-27 LAB — CBC
HEMATOCRIT: 35 % — AB (ref 36.0–46.0)
Hemoglobin: 11.1 g/dL — ABNORMAL LOW (ref 12.0–15.0)
MCH: 21.9 pg — ABNORMAL LOW (ref 26.0–34.0)
MCHC: 31.7 g/dL (ref 30.0–36.0)
MCV: 68.9 fL — ABNORMAL LOW (ref 78.0–100.0)
Platelets: 168 10*3/uL (ref 150–400)
RBC: 5.08 MIL/uL (ref 3.87–5.11)
RDW: 15.4 % (ref 11.5–15.5)
WBC: 5.6 10*3/uL (ref 4.0–10.5)

## 2015-06-27 LAB — BASIC METABOLIC PANEL
Anion gap: 8 (ref 5–15)
BUN: 11 mg/dL (ref 6–20)
CALCIUM: 9.5 mg/dL (ref 8.9–10.3)
CO2: 30 mmol/L (ref 22–32)
Chloride: 104 mmol/L (ref 101–111)
Creatinine, Ser: 0.55 mg/dL (ref 0.44–1.00)
GFR calc Af Amer: >60 mL/min (ref >60)
GFR calc non Af Amer: >60 mL/min (ref >60)
Glucose, Bld: 86 mg/dL (ref 65–99)
Potassium: 4.7 mmol/L (ref 3.5–5.1)
Sodium: 142 mmol/L (ref 135–145)

## 2015-06-27 NOTE — Patient Instructions (Addendum)
YOUR PROCEDURE IS SCHEDULED ON :  07/02/15  REPORT TO  HOSPITAL MAIN ENTRANCE FOLLOW SIGNS TO EAST ELEVATOR - GO TO 3rd FLOOR CHECK IN AT 3 EAST NURSES STATION (SHORT STAY) AT:  6:30 AM  CALL THIS NUMBER IF YOU HAVE PROBLEMS THE MORNING OF SURGERY [phone]  REMEMBER:ONLY 1 PER PERSON MAY GO TO SHORT STAY WITH YOU TO GET READY THE MORNING OF YOUR SURGERY  DO NOT EAT FOOD OR DRINK LIQUIDS AFTER MIDNIGHT  TAKE THESE MEDICINES THE MORNING OF SURGERY: PANTOPRAZOLE  YOU MAY NOT HAVE ANY METAL ON YOUR BODY INCLUDING HAIR PINS AND PIERCING'S. DO NOT WEAR JEWELRY, MAKEUP, LOTIONS, POWDERS OR PERFUMES. DO NOT WEAR NAIL POLISH. DO NOT SHAVE 48 HRS PRIOR TO SURGERY. MEN MAY SHAVE FACE AND NECK.  DO NOT BRING VALUABLES TO HOSPITAL. [facility] IS NOT RESPONSIBLE FOR VALUABLES.  CONTACTS, DENTURES OR PARTIALS MAY NOT BE WORN TO SURGERY. LEAVE SUITCASE IN CAR. CAN BE BROUGHT TO ROOM AFTER SURGERY.  PATIENTS DISCHARGED THE DAY OF SURGERY WILL NOT BE ALLOWED TO DRIVE HOME.  PLEASE READ OVER THE FOLLOWING INSTRUCTION SHEETS _________________________________________________________________________________                                          [facility] - PREPARING FOR SURGERY  Before surgery, you can play an important role.  Because skin is not sterile, your skin needs to be as free of germs as possible.  You can reduce the number of germs on your skin by washing with CHG (chlorahexidine gluconate) soap before surgery.  CHG is an antiseptic cleaner which kills germs and bonds with the skin to continue killing germs even after washing. Please DO NOT use if you have an allergy to CHG or antibacterial soaps.  If your skin becomes reddened/irritated stop using the CHG and inform your nurse when you arrive at Short Stay. Do not shave (including legs and underarms) for at least 48 hours prior to the first CHG shower.  You may shave your face. Please follow these instructions  carefully:   1.  Shower with CHG Soap the night before surgery and the  morning of Surgery.   2.  If you choose to wash your hair, wash your hair first as usual with your  normal  Shampoo.   3.  After you shampoo, rinse your hair and body thoroughly to remove the  shampoo.                                         4.  Use CHG as you would any other liquid soap.  You can apply chg directly  to the skin and wash . Gently wash with scrungie or clean wascloth    5.  Apply the CHG Soap to your body ONLY FROM THE NECK DOWN.   Do not use on open                           Wound or open sores. Avoid contact with eyes, ears mouth and genitals (private parts).                        Genitals (private parts) with your normal soap.  6.  Wash thoroughly, paying special attention to the area where your surgery  will be performed.   7.  Thoroughly rinse your body with warm water from the neck down.   8.  DO NOT shower/wash with your normal soap after using and rinsing off  the CHG Soap .                9.  Pat yourself dry with a clean towel.             10.  Wear clean night clothes to bed after shower             11.  Place clean sheets on your bed the night of your first shower and do not  sleep with pets.  Day of Surgery : Do not apply any lotions/deodorants the morning of surgery.  Please wear clean clothes to the hospital/surgery center.  FAILURE TO FOLLOW THESE INSTRUCTIONS MAY RESULT IN THE CANCELLATION OF YOUR SURGERY    PATIENT SIGNATURE_________________________________  ______________________________________________________________________

## 2015-06-28 LAB — HEMOGLOBIN A1C
Hgb A1c MFr Bld: 5.4 % (ref 4.8–5.6)
Mean Plasma Glucose: 108 mg/dL

## 2015-07-01 LAB — TYPE AND SCREEN
ABO/RH(D): A POS
Antibody Screen: NEGATIVE

## 2015-07-02 ENCOUNTER — Encounter (HOSPITAL_COMMUNITY)
Admission: RE | Disposition: A | Discharge status: Home / Self Care | Payer: Self-pay | Source: Ambulatory Visit | Attending: General Surgery

## 2015-07-02 ENCOUNTER — Encounter (HOSPITAL_COMMUNITY): Payer: Self-pay | Admitting: *Deleted

## 2015-07-02 ENCOUNTER — Inpatient Hospital Stay (HOSPITAL_COMMUNITY)
Admission: RE | Admit: 2015-07-02 | Discharge: 2015-07-07 | DRG: 330 | Disposition: A | Discharge status: Home / Self Care | Payer: 59 | Source: Ambulatory Visit | Attending: General Surgery | Admitting: General Surgery

## 2015-07-02 ENCOUNTER — Inpatient Hospital Stay (HOSPITAL_COMMUNITY): Payer: 59 | Admitting: Anesthesiology

## 2015-07-02 DIAGNOSIS — K625 Hemorrhage of anus and rectum: Secondary | ICD-10-CM | POA: Diagnosis not present

## 2015-07-02 DIAGNOSIS — R319 Hematuria, unspecified: Secondary | ICD-10-CM | POA: Diagnosis not present

## 2015-07-02 DIAGNOSIS — Z433 Encounter for attention to colostomy: Principal | ICD-10-CM

## 2015-07-02 DIAGNOSIS — K660 Peritoneal adhesions (postprocedural) (postinfection): Secondary | ICD-10-CM | POA: Diagnosis present

## 2015-07-02 DIAGNOSIS — K9403 Colostomy malfunction: Secondary | ICD-10-CM

## 2015-07-02 HISTORY — DX: Colostomy malfunction: K94.03

## 2015-07-02 HISTORY — PX: COLOSTOMY TAKEDOWN: SHX5258

## 2015-07-02 SURGERY — CLOSURE, COLOSTOMY, LAPAROSCOPIC
Anesthesia: General | Site: Abdomen

## 2015-07-02 MED ORDER — ROCURONIUM BROMIDE 100 MG/10ML IV SOLN
INTRAVENOUS | Status: AC
  Filled 2015-07-02: qty 1

## 2015-07-02 MED ORDER — DEXTROSE 5 % IV SOLN
2.0000 g | INTRAVENOUS | Status: AC
  Administered 2015-07-02: 2 g via INTRAVENOUS
  Filled 2015-07-02: qty 2

## 2015-07-02 MED ORDER — DIPHENHYDRAMINE HCL 50 MG/ML IJ SOLN: 25.0000 mg | Freq: Four times a day (QID) | INTRAMUSCULAR | Status: DC | PRN

## 2015-07-02 MED ORDER — LACTATED RINGERS IV SOLN: INTRAVENOUS | Status: DC

## 2015-07-02 MED ORDER — LACTATED RINGERS IV SOLN
INTRAVENOUS | Status: DC | PRN
  Administered 2015-07-02 (×3): via INTRAVENOUS

## 2015-07-02 MED ORDER — DEXTROSE 5 % IV SOLN
2.0000 g | Freq: Two times a day (BID) | INTRAVENOUS | Status: AC
  Administered 2015-07-02: 2 g via INTRAVENOUS
  Filled 2015-07-02: qty 2

## 2015-07-02 MED ORDER — HYDROMORPHONE HCL 2 MG/ML IJ SOLN
INTRAMUSCULAR | Status: AC
  Filled 2015-07-02: qty 1

## 2015-07-02 MED ORDER — ACETAMINOPHEN 500 MG PO TABS
1000.0000 mg | ORAL_TABLET | Freq: Four times a day (QID) | ORAL | Status: AC
  Administered 2015-07-02 (×2): 1000 mg via ORAL
  Filled 2015-07-02 (×3): qty 2

## 2015-07-02 MED ORDER — NEOSTIGMINE METHYLSULFATE 10 MG/10ML IV SOLN
INTRAVENOUS | Status: DC | PRN
  Administered 2015-07-02: 2 mg via INTRAVENOUS

## 2015-07-02 MED ORDER — OXYCODONE HCL 5 MG PO TABS
5.0000 mg | ORAL_TABLET | Freq: Once | ORAL | Status: DC | PRN
Start: 2015-07-02 — End: 2015-07-02

## 2015-07-02 MED ORDER — ONDANSETRON HCL 4 MG/2ML IJ SOLN: 4.0000 mg | Freq: Four times a day (QID) | INTRAMUSCULAR | Status: DC | PRN

## 2015-07-02 MED ORDER — ROCURONIUM BROMIDE 100 MG/10ML IV SOLN
INTRAVENOUS | Status: DC | PRN
  Administered 2015-07-02: 10 mg via INTRAVENOUS
  Administered 2015-07-02: 25 mg via INTRAVENOUS
  Administered 2015-07-02: 10 mg via INTRAVENOUS
  Administered 2015-07-02: 5 mg via INTRAVENOUS

## 2015-07-02 MED ORDER — LIDOCAINE HCL (CARDIAC) 20 MG/ML IV SOLN
INTRAVENOUS | Status: AC
  Filled 2015-07-02: qty 5

## 2015-07-02 MED ORDER — ENOXAPARIN SODIUM 40 MG/0.4ML ~~LOC~~ SOLN
40.0000 mg | SUBCUTANEOUS | Status: DC
  Administered 2015-07-03: 40 mg via SUBCUTANEOUS
  Filled 2015-07-02 (×3): qty 0.4

## 2015-07-02 MED ORDER — FENTANYL CITRATE (PF) 250 MCG/5ML IJ SOLN
INTRAMUSCULAR | Status: AC
  Filled 2015-07-02: qty 25

## 2015-07-02 MED ORDER — PROPOFOL 10 MG/ML IV BOLUS
INTRAVENOUS | Status: AC
  Filled 2015-07-02: qty 20

## 2015-07-02 MED ORDER — HEPARIN SODIUM (PORCINE) 5000 UNIT/ML IJ SOLN
INTRAMUSCULAR | Status: AC
  Filled 2015-07-02: qty 1

## 2015-07-02 MED ORDER — DEXAMETHASONE SODIUM PHOSPHATE 10 MG/ML IJ SOLN
INTRAMUSCULAR | Status: AC
  Filled 2015-07-02: qty 1

## 2015-07-02 MED ORDER — PROPOFOL 10 MG/ML IV BOLUS
INTRAVENOUS | Status: DC | PRN
  Administered 2015-07-02: 150 mg via INTRAVENOUS

## 2015-07-02 MED ORDER — CEFOTETAN DISODIUM-DEXTROSE 2-2.08 GM-% IV SOLR
INTRAVENOUS | Status: AC
  Filled 2015-07-02: qty 50

## 2015-07-02 MED ORDER — OXYCODONE-ACETAMINOPHEN 5-325 MG PO TABS
1.0000 | ORAL_TABLET | ORAL | Status: DC | PRN
  Administered 2015-07-03 – 2015-07-04 (×5): 1 via ORAL
  Administered 2015-07-07: 2 via ORAL
  Filled 2015-07-02 (×6): qty 1

## 2015-07-02 MED ORDER — MIDAZOLAM HCL 2 MG/2ML IJ SOLN
INTRAMUSCULAR | Status: AC
  Filled 2015-07-02: qty 4

## 2015-07-02 MED ORDER — PANTOPRAZOLE SODIUM 40 MG PO TBEC
40.0000 mg | DELAYED_RELEASE_TABLET | Freq: Every day | ORAL | Status: DC
  Administered 2015-07-02 – 2015-07-07 (×6): 40 mg via ORAL
  Filled 2015-07-02 (×8): qty 1

## 2015-07-02 MED ORDER — SODIUM CHLORIDE 0.9 % IJ SOLN
INTRAMUSCULAR | Status: AC
  Filled 2015-07-02: qty 10

## 2015-07-02 MED ORDER — LIDOCAINE HCL (CARDIAC) 20 MG/ML IV SOLN
INTRAVENOUS | Status: DC | PRN
  Administered 2015-07-02: 50 mg via INTRAVENOUS

## 2015-07-02 MED ORDER — DIPHENHYDRAMINE HCL 25 MG PO CAPS: 25.0000 mg | ORAL_CAPSULE | Freq: Four times a day (QID) | ORAL | Status: DC | PRN

## 2015-07-02 MED ORDER — OXYCODONE HCL 5 MG/5ML PO SOLN
5.0000 mg | Freq: Once | ORAL | Status: DC | PRN
  Filled 2015-07-02: qty 5

## 2015-07-02 MED ORDER — ONDANSETRON HCL 4 MG PO TABS: 4.0000 mg | ORAL_TABLET | Freq: Four times a day (QID) | ORAL | Status: DC | PRN

## 2015-07-02 MED ORDER — 0.9 % SODIUM CHLORIDE (POUR BTL) OPTIME
TOPICAL | Status: DC | PRN
  Administered 2015-07-02: 1000 mL

## 2015-07-02 MED ORDER — ALVIMOPAN 12 MG PO CAPS
12.0000 mg | ORAL_CAPSULE | Freq: Once | ORAL | Status: AC
  Administered 2015-07-02: 12 mg via ORAL
  Filled 2015-07-02: qty 1

## 2015-07-02 MED ORDER — PROMETHAZINE HCL 25 MG/ML IJ SOLN: 6.2500 mg | INTRAMUSCULAR | Status: DC | PRN

## 2015-07-02 MED ORDER — EPHEDRINE SULFATE 50 MG/ML IJ SOLN
INTRAMUSCULAR | Status: DC | PRN
  Administered 2015-07-02: 5 mg via INTRAVENOUS

## 2015-07-02 MED ORDER — KCL IN DEXTROSE-NACL 20-5-0.45 MEQ/L-%-% IV SOLN
INTRAVENOUS | Status: DC
  Administered 2015-07-02 – 2015-07-03 (×3): via INTRAVENOUS
  Administered 2015-07-05: 60 mL/h via INTRAVENOUS
  Filled 2015-07-02 (×9): qty 1000

## 2015-07-02 MED ORDER — HYDROMORPHONE HCL 1 MG/ML IJ SOLN
INTRAMUSCULAR | Status: AC
  Filled 2015-07-02: qty 1

## 2015-07-02 MED ORDER — ONDANSETRON HCL 4 MG/2ML IJ SOLN
INTRAMUSCULAR | Status: AC
  Filled 2015-07-02: qty 2

## 2015-07-02 MED ORDER — BUPIVACAINE-EPINEPHRINE 0.25% -1:200000 IJ SOLN
INTRAMUSCULAR | Status: AC
  Filled 2015-07-02: qty 1

## 2015-07-02 MED ORDER — LACTATED RINGERS IR SOLN
Status: DC | PRN
Start: 2015-07-02 — End: 2015-07-02
  Administered 2015-07-02: 1000 mL

## 2015-07-02 MED ORDER — FENTANYL CITRATE (PF) 100 MCG/2ML IJ SOLN
INTRAMUSCULAR | Status: DC | PRN
  Administered 2015-07-02 (×5): 50 ug via INTRAVENOUS

## 2015-07-02 MED ORDER — ONDANSETRON HCL 4 MG/2ML IJ SOLN
INTRAMUSCULAR | Status: DC | PRN
  Administered 2015-07-02: 4 mg via INTRAVENOUS

## 2015-07-02 MED ORDER — MIDAZOLAM HCL 5 MG/5ML IJ SOLN
INTRAMUSCULAR | Status: DC | PRN
  Administered 2015-07-02 (×2): 1 mg via INTRAVENOUS

## 2015-07-02 MED ORDER — HYDROMORPHONE HCL 1 MG/ML IJ SOLN
INTRAMUSCULAR | Status: DC | PRN
  Administered 2015-07-02: 1 mg via INTRAVENOUS
  Administered 2015-07-02 (×2): 0.5 mg via INTRAVENOUS

## 2015-07-02 MED ORDER — BUPIVACAINE-EPINEPHRINE (PF) 0.25% -1:200000 IJ SOLN
INTRAMUSCULAR | Status: DC | PRN
  Administered 2015-07-02: 7 mL via PERINEURAL

## 2015-07-02 MED ORDER — HEPARIN SODIUM (PORCINE) 1000 UNIT/ML IJ SOLN
INTRAMUSCULAR | Status: DC | PRN
  Administered 2015-07-02: 5000 [IU] via INTRAVENOUS

## 2015-07-02 MED ORDER — HYDROMORPHONE HCL 1 MG/ML IJ SOLN
0.2500 mg | INTRAMUSCULAR | Status: DC | PRN
  Administered 2015-07-02: 0.25 mg via INTRAVENOUS

## 2015-07-02 MED ORDER — ALVIMOPAN 12 MG PO CAPS
12.0000 mg | ORAL_CAPSULE | Freq: Two times a day (BID) | ORAL | Status: DC
  Administered 2015-07-03 – 2015-07-05 (×6): 12 mg via ORAL
  Filled 2015-07-02 (×9): qty 1

## 2015-07-02 MED ORDER — GLYCOPYRROLATE 0.2 MG/ML IJ SOLN
INTRAMUSCULAR | Status: DC | PRN
  Administered 2015-07-02: 0.4 mg via INTRAVENOUS

## 2015-07-02 MED ORDER — LACTATED RINGERS IV SOLN
INTRAVENOUS | Status: DC
  Administered 2015-07-02: 13:00:00 via INTRAVENOUS

## 2015-07-02 MED ORDER — MORPHINE SULFATE 2 MG/ML IJ SOLN
2.0000 mg | INTRAMUSCULAR | Status: DC | PRN
  Administered 2015-07-02 – 2015-07-06 (×7): 2 mg via INTRAVENOUS
  Filled 2015-07-02 (×7): qty 1

## 2015-07-02 MED ORDER — EPHEDRINE SULFATE 50 MG/ML IJ SOLN
INTRAMUSCULAR | Status: AC
  Filled 2015-07-02: qty 1

## 2015-07-02 SURGICAL SUPPLY — 73 items
APPLIER CLIP 5 13 M/L LIGAMAX5 (MISCELLANEOUS)
APPLIER CLIP ROT 10 11.4 M/L (STAPLE)
APR CLP MED LRG 11.4X10 (STAPLE)
APR CLP MED LRG 5 ANG JAW (MISCELLANEOUS)
BLADE EXTENDED COATED 6.5IN (ELECTRODE) ×1 IMPLANT
BLADE HEX COATED 2.75 (ELECTRODE) ×2 IMPLANT
CABLE HIGH FREQUENCY MONO STRZ (ELECTRODE) ×2 IMPLANT
CELLS DAT CNTRL 66122 CELL SVR (MISCELLANEOUS) IMPLANT
CLIP APPLIE 5 13 M/L LIGAMAX5 (MISCELLANEOUS) ×1 IMPLANT
CLIP APPLIE ROT 10 11.4 M/L (STAPLE) IMPLANT
COVER SURGICAL LIGHT HANDLE (MISCELLANEOUS) ×1 IMPLANT
DECANTER SPIKE VIAL GLASS SM (MISCELLANEOUS) ×2 IMPLANT
DRAIN CHANNEL 19F RND (DRAIN) IMPLANT
DRAPE LAPAROSCOPIC ABDOMINAL (DRAPES) ×2 IMPLANT
DRAPE UTILITY XL STRL (DRAPES) ×4 IMPLANT
DRSG TEGADERM 2-3/8X2-3/4 SM (GAUZE/BANDAGES/DRESSINGS) ×1 IMPLANT
ELECT PENCIL ROCKER SW 15FT (MISCELLANEOUS) ×4 IMPLANT
ELECT REM PT RETURN 9FT ADLT (ELECTROSURGICAL) ×2
ELECTRODE REM PT RTRN 9FT ADLT (ELECTROSURGICAL) ×1 IMPLANT
EVACUATOR SILICONE 100CC (DRAIN) IMPLANT
GAUZE SPONGE 2X2 8PLY STRL LF (GAUZE/BANDAGES/DRESSINGS) IMPLANT
GAUZE SPONGE 4X4 12PLY STRL (GAUZE/BANDAGES/DRESSINGS) ×1 IMPLANT
GLOVE BIO SURGEON STRL SZ 6.5 (GLOVE) ×4 IMPLANT
GLOVE BIOGEL PI IND STRL 7.0 (GLOVE) ×2 IMPLANT
GLOVE BIOGEL PI INDICATOR 7.0 (GLOVE) ×8
GOWN STRL REUS W/TWL 2XL LVL3 (GOWN DISPOSABLE) ×4 IMPLANT
GOWN STRL REUS W/TWL XL LVL3 (GOWN DISPOSABLE) ×10 IMPLANT
HANDLE STAPLE EGIA 4 XL (STAPLE) ×1 IMPLANT
HOLDER FOLEY CATH W/STRAP (MISCELLANEOUS) ×1 IMPLANT
LEGGING LITHOTOMY PAIR STRL (DRAPES) ×2 IMPLANT
LIGASURE IMPACT 36 18CM CVD LR (INSTRUMENTS) IMPLANT
LIQUID BAND (GAUZE/BANDAGES/DRESSINGS) ×1 IMPLANT
NS IRRIG 1000ML POUR BTL (IV SOLUTION) ×2 IMPLANT
PACK COLON (CUSTOM PROCEDURE TRAY) ×2 IMPLANT
PORT LAP GEL ALEXIS MED 5-9CM (MISCELLANEOUS) ×1 IMPLANT
RELOAD EGIA 60 MED/THCK PURPLE (STAPLE) ×2 IMPLANT
RELOAD STAPLE 60 MED/THCK ART (STAPLE) IMPLANT
RETRACTOR WND ALEXIS 18 MED (MISCELLANEOUS) IMPLANT
RTRCTR WOUND ALEXIS 18CM MED (MISCELLANEOUS)
SCISSORS LAP 5X35 DISP (ENDOMECHANICALS) ×2 IMPLANT
SEALER TISSUE G2 STRG ARTC 35C (ENDOMECHANICALS) ×2 IMPLANT
SET IRRIG TUBING LAPAROSCOPIC (IRRIGATION / IRRIGATOR) ×2 IMPLANT
SLEEVE XCEL OPT CAN 5 100 (ENDOMECHANICALS) ×2 IMPLANT
SOLUTION ANTI FOG 6CC (MISCELLANEOUS) ×2 IMPLANT
SPONGE GAUZE 2X2 STER 10/PKG (GAUZE/BANDAGES/DRESSINGS) ×1
SPONGE LAP 18X18 X RAY DECT (DISPOSABLE) IMPLANT
STAPLER CIRC CVD 29MM 37CM (STAPLE) ×1 IMPLANT
STAPLER VISISTAT 35W (STAPLE) ×2 IMPLANT
SUCTION POOLE TIP (SUCTIONS) ×2 IMPLANT
SUT ETHILON 2 0 PS N (SUTURE) IMPLANT
SUT NOVA NAB DX-16 0-1 5-0 T12 (SUTURE) ×2 IMPLANT
SUT PDS AB 1 CTX 36 (SUTURE) IMPLANT
SUT PDS AB 1 TP1 96 (SUTURE) IMPLANT
SUT PROLENE 2 0 KS (SUTURE) ×1 IMPLANT
SUT SILK 2 0 (SUTURE)
SUT SILK 2 0 SH CR/8 (SUTURE) ×2 IMPLANT
SUT SILK 2-0 18XBRD TIE 12 (SUTURE) ×1 IMPLANT
SUT SILK 3 0 (SUTURE)
SUT SILK 3 0 SH CR/8 (SUTURE) ×3 IMPLANT
SUT SILK 3-0 18XBRD TIE 12 (SUTURE) ×1 IMPLANT
SUT VIC AB 2-0 SH 18 (SUTURE) ×1 IMPLANT
SUT VICRYL 2 0 18  UND BR (SUTURE)
SUT VICRYL 2 0 18 UND BR (SUTURE) ×1 IMPLANT
SYS LAPSCP GELPORT 120MM (MISCELLANEOUS)
SYSTEM LAPSCP GELPORT 120MM (MISCELLANEOUS) IMPLANT
TAPE CLOTH SURG 4X10 WHT LF (GAUZE/BANDAGES/DRESSINGS) ×1 IMPLANT
TOWEL OR 17X26 10 PK STRL BLUE (TOWEL DISPOSABLE) ×1 IMPLANT
TOWEL OR NON WOVEN STRL DISP B (DISPOSABLE) ×4 IMPLANT
TRAY FOLEY W/METER SILVER 14FR (SET/KITS/TRAYS/PACK) ×2 IMPLANT
TRAY FOLEY W/METER SILVER 16FR (SET/KITS/TRAYS/PACK) ×1 IMPLANT
TROCAR BLADELESS OPT 5 100 (ENDOMECHANICALS) ×2 IMPLANT
TROCAR XCEL BLUNT TIP 100MML (ENDOMECHANICALS) ×2 IMPLANT
TUBING FILTER THERMOFLATOR (ELECTROSURGICAL) ×2 IMPLANT

## 2015-07-02 NOTE — Anesthesia Postprocedure Evaluation (Signed)
Anesthesia Post Note  Patient: [name] [name]  Procedure(s) Performed: Procedure(s) (LRB): LAPAROSCOPIC COLOSTOMY REVERSAL SPLENIC FLEXURE MOBILIZATION AND PARTIAL COLECTOMY (N/A)  Anesthesia type: General  Patient location: PACU  Post pain: Pain level controlled and Adequate analgesia  Post assessment: Post-op Vital signs reviewed, Patient's Cardiovascular Status Stable, Respiratory Function Stable, Patent Airway and Pain level controlled  Last Vitals:  Filed Vitals:   07/02/15 1300  BP: 158/77  Pulse: 74  Temp: 36.6 C  Resp: 12    Post vital signs: Reviewed and stable  Level of consciousness: awake, alert  and oriented  Complications: No apparent anesthesia complications

## 2015-07-02 NOTE — Anesthesia Preprocedure Evaluation (Signed)
Anesthesia Evaluation  Patient identified by MRN, date of birth, ID band Patient awake    Reviewed: Allergy & Precautions, NPO status , Patient's Chart, lab work & pertinent test results  Airway Mallampati: II   Neck ROM: full    Dental   Pulmonary Current Smoker,  breath sounds clear to auscultation        Cardiovascular negative cardio ROS  Rhythm:regular Rate:Normal     Neuro/Psych    GI/Hepatic Colon perforation. S/p colostomy.   Endo/Other    Renal/GU      Musculoskeletal   Abdominal   Peds  Hematology   Anesthesia Other Findings   Reproductive/Obstetrics                             Anesthesia Physical Anesthesia Plan  ASA: II  Anesthesia Plan: General   Post-op Pain Management:    Induction: Intravenous  Airway Management Planned: Oral ETT  Additional Equipment:   Intra-op Plan:   Post-operative Plan: Extubation in OR  Informed Consent: I have reviewed the patients History and Physical, chart, labs and discussed the procedure including the risks, benefits and alternatives for the proposed anesthesia with the patient or authorized representative who has indicated his/her understanding and acceptance.     Plan Discussed with: CRNA, Anesthesiologist and Surgeon  Anesthesia Plan Comments:         Anesthesia Quick Evaluation

## 2015-07-02 NOTE — H&P (Signed)
  HPI: [name] [name] is a 54 year old [name] who experienced a colon perforation during a screening colonoscopy. She underwent a colostomy due to a difficult resection. Her f/u colonoscopy was normal.  She is here today for reversal.      translator present. Past Medical History  Diagnosis Date  . Constipation     Past Surgical History  Procedure Laterality Date  . Cesarean section  1992  . Appendectomy  1993  . Dilation and curettage of uterus  1990    "miscarriage"    Family History  Problem Relation Age of Onset  . Colon cancer Neg Hx          No Known Allergies   Review of Systems - General ROS: negative for - chills or fever Respiratory ROS: no cough, shortness of breath, or wheezing Cardiovascular ROS: no chest pain or dyspnea on exertion Gastrointestinal ROS: no abdominal pain, change in bowel habits, or black or bloody stools Genito-Urinary ROS: no dysuria, trouble voiding, or hematuria Musculoskeletal ROS: negative for - joint pain or muscular weakness  BP 109/77 mmHg  Pulse 73  Temp(Src) 97.5 F (36.4 C) (Oral)  Resp 16  Ht  (1.397 m)  Wt 32.716 kg (72 lb 2 oz)  BMI 16.76 kg/m2  SpO2 99%   Physical Exam  Constitutional: She is oriented to person, place, and time. She appears well-developed and well-nourished. No distress.  Petite  Cardiovascular: Normal rate, regular rhythm, normal heart sounds and intact distal pulses. Exam reveals no gallop and no friction rub.  No murmur heard. Respiratory: Effort normal and breath sounds normal. No respiratory distress. She has no wheezes. She has no rales. She exhibits no tenderness.  GI: Soft. Bowel sounds are normal. She exhibits no distension and no mass. Midline scar  Musculoskeletal: Normal range of motion. She exhibits no edema or tenderness.  Neurological: She is alert and oriented to person, place, and time.  Skin:  Skin is warm and dry. No rash noted. She is not diaphoretic. No erythema. No pallor.  Psychiatric: She has a normal mood and affect. Her behavior is normal. Judgment and thought content normal.    Assessment/Plan Colon perforation s/p colonoscopy  -Pt now here to reverse her colostomy.The surgery and anatomy were described to the patient as well as the risks of surgery and the possible complications.  These include: Bleeding, deep abdominal infections and possible wound complications such as hernia and infection, damage to adjacent structures, leak of surgical connections, which can lead to other surgeries and possibly an ostomy, possible need for other procedures, such as abscess drains in radiology, possible prolonged hospital stay, possible diarrhea from removal of part of the colon, possible constipation from narcotics, prolonged fatigue/weakness or appetite loss, possible complications of their medical problems such as heart disease or arrhythmias ossible constipation from narcotics, prolonged fatigue/weakness or appetite loss, death (less than 1%).  I believe she understands these risks and has agreed to proceed.

## 2015-07-02 NOTE — Anesthesia Procedure Notes (Signed)
Procedure Name: Intubation Date/Time: 07/02/2015 8:42 AM Performed by: Enriqueta Shutter D Pre-anesthesia Checklist: Patient identified, Emergency Drugs available, Suction available and Patient being monitored Patient Re-evaluated:Patient Re-evaluated prior to inductionOxygen Delivery Method: Circle System Utilized Preoxygenation: Pre-oxygenation with 100% oxygen Intubation Type: IV induction Ventilation: Mask ventilation without difficulty Laryngoscope Size: Mac and 3 Grade View: Grade II Tube type: Oral Tube size: 7.0 mm Number of attempts: 1 Airway Equipment and Method: Stylet and Oral airway Placement Confirmation: ETT inserted through vocal cords under direct vision,  positive ETCO2 and breath sounds checked- equal and bilateral Secured at: 20 cm Tube secured with: Tape Dental Injury: Teeth and Oropharynx as per pre-operative assessment

## 2015-07-02 NOTE — Transfer of Care (Signed)
Immediate Anesthesia Transfer of Care Note  Patient: [name] [name]  Procedure(s) Performed: Procedure(s): LAPAROSCOPIC COLOSTOMY REVERSAL SPLENIC FLEXURE MOBILIZATION AND PARTIAL COLECTOMY (N/A)  Patient Location: PACU  Anesthesia Type:General  Level of Consciousness: awake, alert  and oriented  Airway & Oxygen Therapy: Patient Spontanous Breathing and Patient connected to face mask oxygen  Post-op Assessment: Report given to RN and Post -op Vital signs reviewed and stable  Post vital signs: Reviewed and stable  Last Vitals:  Filed Vitals:   07/02/15 0656  BP: 109/77  Pulse: 73  Temp: 36.4 C  Resp: 16    Complications: No apparent anesthesia complications

## 2015-07-02 NOTE — Op Note (Signed)
07/02/2015  12:50 PM  PATIENT:  [name] [name]  54 y.o. female  Patient Care Team: Nani Ravens, MD as PCP - General  PRE-OPERATIVE DIAGNOSIS:  Previous perforated colon, colostomy stricture  POST-OPERATIVE DIAGNOSIS:  Previous perforated colon, colostomy stricture  PROCEDURE:  LAPAROSCOPIC COLOSTOMY REVERSAL  SPLENIC FLEXURE MOBILIZATION  PELVIC LYSIS OF ADHESIONS 1 HOUR   SURGEON:  Surgeon(s): Romie Levee, MD  ASSISTANT: none   ANESTHESIA:   local and general  EBL:  Total I/O In: 2000 [I.V.:2000] Out: 500 [Urine:350; Blood:150]  Delay start of Pharmacological VTE agent (>24hrs) due to surgical blood loss or risk of bleeding:  no  DRAINS: none   SPECIMEN:  Source of Specimencolostomy and proximal rectal stump  DISPOSITION OF SPECIMEN:  PATHOLOGY  COUNTS:  YES  PLAN OF CARE: Admit to inpatient   PATIENT DISPOSITION:  PACU - hemodynamically stable.  INDICATION:    This is a 54 year old female who underwent a Hartman's procedure for perforated colon after colonoscopy. We have completed her colonoscopy or now ready to reverse her Hartman's. She has developed a colostomy stricture which is causing her significant amount of pain and difficulty with bowel movements.   Risks such as bleeding, infection, abscess, leak, reoperation, hernia, heart attack, death, and other risks were discussed.  I noted a good likelihood this will help address the problem.   Goals of post-operative recovery were discussed as well.    The patient expressed understanding & wished to proceed with surgery.  OR FINDINGS:     Significant intra-abdominal adhesions noted throughout the abdomen and most pronounced in the pelvis.    DESCRIPTION:   Informed consent was confirmed.  The patient underwent general anaesthesia without difficulty.  The patient was positioned appropriately.  VTE prevention in place.  The patient's abdomen was clipped, prepped, & draped in a sterile fashion.  Surgical  timeout confirmed our plan.  The patient was positioned in reverse Trendelenburg.  Abdominal entry was gained using open technique. A Hassan port was placed with a pursestring stay suture.  Entry was clean.  I induced carbon dioxide insufflation.  Camera inspection revealed no injury.  Extra ports were carefully placed under direct laparoscopic visualization.   I reflected the greater omentum and the upper abdomen the small bowel in the upper abdomen.  I identified the colostomy. I performed a splenic flexure mobilization using the Enseal device to divide the gastrocolic ligament and then the splenocolic ligament. After the splenic flexure was mobilized, I removed all adhesions from the ostomy itself. This allowed for mobilization of the colon into the pelvis for anastomosis.  I then placed the patient in Trendelenburg and began to dissect out the pelvis. There were significant small bowel loops stuck into the pelvis area and these were taken down using sharp dissection with a prescript scissors. I began on the left side and mobilized the loops of small bowel there that were overlying the rectal stump. After these were free I terminated attention to the right side. I continued to dissect out the loops of small bowel from the right using laparoscopic scissors. Once this was completed, I had my assistant placed a EEA sizer into the rectum. I identified the rectal stump. I continued to mobilize the small bowel out of the pelvis taking down the adhesions to the sacral prominence as well. I stayed above the peritoneum, so as to not enter the retroperitoneal plane and risk injury to the ureter.   once all the small bowel was mobilized out  of the pelvis, we proceeded with continuing dissection of the distal rectal stump with assistance from the EEA sizer internally to help identify the boundaries. I bluntly dissected both lateral edges of the stump. I then used the Enseal device to divide the mesentery of the proximal  portion of this right next to the remaining distal sigmoid to allow for the stapler to be mobilized to the end of the stump. Once this was free, we were able to get the EEA sizer all the way to the top of the rectum. I divided the remaining sigmoid colon, approximately 3 inches. This was done using a purple load toe Covidian laparoscopic stapler. It was later removed from the abdomen using the ostomy site.  After this I took down the ostomy at the skin. This was done using electrocautery and blunt dissection. The external portion of the ostomy was resected using Bovie electrocautery. The mesenteric vessel was tied using a 3-0 silk suture. EEA sizers were used to determine the appropriate staple width. I decided to use a 29 mm EEA stapler. A pursestring was placed using a pursestring device and a 2-0 Prolene suture. This was secured with 3-0 interrupted silk sutures. The anvil was placed into the remaining open end of the colon. The pursestring was tied tightly around this. This was then dropped back into the abdomen. I removed the rectal specimen as well as a portion of resected omentum. I then evaluated for length laparoscopically after placing an Alexis wound protector. The remaining colon did not reach all the way into the pelvis. I dissected out a portion of the mesentery that appeared to be holding this from reaching into the pelvis. This was done using the Enseal device. After this was completed the colon reached easily to the rectal stump. It appeared well-perfused. The EEA stapler was introduced into the patient's anal canal and advanced to the end of the rectum. The anastomosis was created without difficulty and without tension. This was tested underwater using insufflation. There was no leak noted. After this was completed I desufflated the abdomen and brought out the small bowel which was adherent in the pelvis. I ran this portion of small bowel to identify any potential injury. There was one small spot  that was thinned out and 3 interrupted 3-0 silk sutures were placed to close this area back down. After this, the small bowel was placed back into the abdomen. The abdomen was irrigated laparoscopically. Hemostasis was good. We removed the Alexis wound protector and changed to clean drapes, gowns, gloves and instruments. I then closed the fascia of the ostomy site vertically using interrupted 0 Novafil sutures. The subcutaneous tissue was closed using a pursestring 2-0 Vicryl suture. The dermal layer was also closed using a pursestring suture. A Telfa was placed into the middle of the wound and a sterile dressing was applied. The remaining port sites were closed using 4-0 Vicryl sutures and Dermabond. All counts were correct per operating room staff. The patient was extubated and sent to the postanesthesia care unit in stable condition.

## 2015-07-03 ENCOUNTER — Encounter (HOSPITAL_COMMUNITY): Payer: Self-pay | Admitting: General Surgery

## 2015-07-03 LAB — CBC
HCT: 31.9 % — ABNORMAL LOW (ref 36.0–46.0)
Hemoglobin: 10.1 g/dL — ABNORMAL LOW (ref 12.0–15.0)
MCH: 22.1 pg — ABNORMAL LOW (ref 26.0–34.0)
MCHC: 31.7 g/dL (ref 30.0–36.0)
MCV: 69.8 fL — AB (ref 78.0–100.0)
Platelets: 159 10*3/uL (ref 150–400)
RBC: 4.57 MIL/uL (ref 3.87–5.11)
RDW: 15.2 % (ref 11.5–15.5)
WBC: 10.3 10*3/uL (ref 4.0–10.5)

## 2015-07-03 LAB — BASIC METABOLIC PANEL
Anion gap: 3 — ABNORMAL LOW (ref 5–15)
BUN: 6 mg/dL (ref 6–20)
CO2: 31 mmol/L (ref 22–32)
Calcium: 8.8 mg/dL — ABNORMAL LOW (ref 8.9–10.3)
Chloride: 103 mmol/L (ref 101–111)
Creatinine, Ser: 0.56 mg/dL (ref 0.44–1.00)
Glucose, Bld: 116 mg/dL — ABNORMAL HIGH (ref 65–99)
Potassium: 4.3 mmol/L (ref 3.5–5.1)
Sodium: 137 mmol/L (ref 135–145)

## 2015-07-03 NOTE — Progress Notes (Signed)
1 Day Post-Op lap colostomy reversal Subjective: Doing well, having crampy lower abd pain  Objective: Vital signs in last 24 hours: Temp:  [97.5 F (36.4 C)-99.3 F (37.4 C)] 99.1 F (37.3 C) (08/11 0538) Pulse Rate:  [59-84] 59 (08/11 0538) Resp:  [11-16] 16 (08/11 0538) BP: (113-164)/(58-85) 113/61 mmHg (08/11 0538) SpO2:  [100 %] 100 % (08/11 0538)   Intake/Output from previous day: 08/10 0701 - 08/11 0700 In: 4166.3 [P.O.:100; I.V.:4066.3] Out: 3675 [Urine:3525; Blood:150] Intake/Output this shift:     General appearance: alert and cooperative GI: normal findings: soft, non-tender, non-distended  Incision: slight clear drainage present  Lab Results:   Recent Labs  07/03/15 0500  WBC 10.3  HGB 10.1*  HCT 31.9*  PLT 159   BMET  Recent Labs  07/03/15 0500  NA 137  K 4.3  CL 103  CO2 31  GLUCOSE 116*  BUN 6  CREATININE 0.56  CALCIUM 8.8*   PT/INR No results for input(s): LABPROT, INR in the last 72 hours. ABG No results for input(s): PHART, HCO3 in the last 72 hours.  Invalid input(s): PCO2, PO2  MEDS, Scheduled . acetaminophen  1,000 mg Oral 4 times per day  . alvimopan  12 mg Oral BID  . enoxaparin (LOVENOX) injection  40 mg Subcutaneous Q24H  . pantoprazole  40 mg Oral Daily    Studies/Results: No results found.  Assessment: s/p Procedure(s): LAPAROSCOPIC COLOSTOMY REVERSAL SPLENIC FLEXURE MOBILIZATION AND PARTIAL COLECTOMY Patient Active Problem List   Diagnosis Date Noted  . 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 S/P colostomy 03/02/2015  . Colon perforation 02/20/2015  . Constipation 12/19/2014  . Colon cancer screening 12/19/2014  . Ingestion of foreign substance 11/06/2014  . Ingestion of substance 11/06/2014  . Neuropathy of foot 12/10/2013  . Skin lesion 11/12/2011  . Corn of toe 10/18/2011  . TOBACCO DEPENDENCE 01/19/2007  . HEARING LOSS NOS OR DEAFNESS 01/19/2007  . MENOPAUSAL SYNDROME 01/19/2007    Expected post op  course  Plan: Advance diet to clears as tolerated Ambulate Change dressing BID   LOS: 1 day     .Vanita Panda, MD [facility], [place] [phone]   07/03/2015 8:25 AM

## 2015-07-03 NOTE — Progress Notes (Signed)
Pt c/o needing to "pee" several times over the last 2-3 hrs.  Adjusted foley cath to make sure it was draining well.  Urine noted to be draining w/o difficulty.  Bladder scan showed urine in bladder to be over 200cc.  Deflated bulb in catheter and pushed catheter tubing farther into bladder after first cleansing tubing w/ betadine swabs.  Inflated bulb w/ 10cc NS.  Urine immediately started draining through tubing.  Pt stated she felt much better.

## 2015-07-04 LAB — CBC
HCT: 30.7 % — ABNORMAL LOW (ref 36.0–46.0)
Hemoglobin: 9.5 g/dL — ABNORMAL LOW (ref 12.0–15.0)
MCH: 21.4 pg — ABNORMAL LOW (ref 26.0–34.0)
MCHC: 30.9 g/dL (ref 30.0–36.0)
MCV: 69.1 fL — ABNORMAL LOW (ref 78.0–100.0)
PLATELETS: 168 10*3/uL (ref 150–400)
RBC: 4.44 MIL/uL (ref 3.87–5.11)
RDW: 15.3 % (ref 11.5–15.5)
WBC: 8.9 10*3/uL (ref 4.0–10.5)

## 2015-07-04 LAB — BASIC METABOLIC PANEL
ANION GAP: 7 (ref 5–15)
BUN: 6 mg/dL (ref 6–20)
CHLORIDE: 101 mmol/L (ref 101–111)
CO2: 28 mmol/L (ref 22–32)
Calcium: 9.1 mg/dL (ref 8.9–10.3)
Creatinine, Ser: 0.58 mg/dL (ref 0.44–1.00)
GFR calc non Af Amer: >60 mL/min (ref >60)
Glucose, Bld: 90 mg/dL (ref 65–99)
Potassium: 3.6 mmol/L (ref 3.5–5.1)
Sodium: 136 mmol/L (ref 135–145)

## 2015-07-04 MED ORDER — SODIUM CHLORIDE 0.9 % IJ SOLN: 9.0000 mL | INTRAMUSCULAR | Status: DC | PRN

## 2015-07-04 MED ORDER — NALOXONE HCL 0.4 MG/ML IJ SOLN: 0.4000 mg | INTRAMUSCULAR | Status: DC | PRN

## 2015-07-04 MED ORDER — MORPHINE SULFATE 1 MG/ML IV SOLN
INTRAVENOUS | Status: DC
  Administered 2015-07-04: 11:00:00 via INTRAVENOUS
  Administered 2015-07-04: 1.5 mg via INTRAVENOUS
  Administered 2015-07-05: 4.5 mg via INTRAVENOUS
  Administered 2015-07-05 (×2): 3 mg via INTRAVENOUS
  Administered 2015-07-05 – 2015-07-06 (×2): 1.5 mg via INTRAVENOUS
  Filled 2015-07-04 (×2): qty 25

## 2015-07-04 MED ORDER — ENOXAPARIN SODIUM 30 MG/0.3ML ~~LOC~~ SOLN
30.0000 mg | SUBCUTANEOUS | Status: DC
  Administered 2015-07-04 – 2015-07-06 (×3): 30 mg via SUBCUTANEOUS
  Filled 2015-07-04 (×4): qty 0.3

## 2015-07-04 MED ORDER — BOOST / RESOURCE BREEZE PO LIQD
1.0000 | Freq: Two times a day (BID) | ORAL | Status: DC
  Administered 2015-07-04: 0.9875 via ORAL
  Administered 2015-07-05 – 2015-07-07 (×3): 1 via ORAL

## 2015-07-04 MED ORDER — ENOXAPARIN SODIUM 40 MG/0.4ML ~~LOC~~ SOLN
40.0000 mg | SUBCUTANEOUS | Status: DC
Start: 2015-07-04 — End: 2015-07-04

## 2015-07-04 NOTE — Progress Notes (Signed)
2 Days Post-Op lap colostomy reversal Subjective: Doing well, having crampy lower abd pain, passing some blood in her urine  Objective: Vital signs in last 24 hours: Temp:  [98.1 F (36.7 C)-99.2 F (37.3 C)] 98.9 F (37.2 C) (08/12 0630) Pulse Rate:  [67-80] 76 (08/12 0630) Resp:  [16] 16 (08/12 0630) BP: (107-133)/(61-85) 117/70 mmHg (08/12 0630) SpO2:  [98 %-100 %] 99 % (08/12 0630)   Intake/Output from previous day: 08/11 0701 - 08/12 0700 In: 1087.5 [P.O.:600; I.V.:487.5] Out: 2300 [Urine:2300] Intake/Output this shift:     General appearance: alert and cooperative GI: normal findings: soft, non-tender, non-distended  Incision: slight drainage present  Lab Results:   Recent Labs  07/03/15 0500 07/04/15 0450  WBC 10.3 8.9  HGB 10.1* 9.5*  HCT 31.9* 30.7*  PLT 159 168   BMET  Recent Labs  07/03/15 0500 07/04/15 0450  NA 137 136  K 4.3 3.6  CL 103 101  CO2 31 28  GLUCOSE 116* 90  BUN 6 6  CREATININE 0.56 0.58  CALCIUM 8.8* 9.1   PT/INR No results for input(s): LABPROT, INR in the last 72 hours. ABG No results for input(s): PHART, HCO3 in the last 72 hours.  Invalid input(s): PCO2, PO2  MEDS, Scheduled . alvimopan  12 mg Oral BID  . enoxaparin (LOVENOX) injection  40 mg Subcutaneous Q24H  . pantoprazole  40 mg Oral Daily    Studies/Results: No results found.  Assessment: s/p Procedure(s): LAPAROSCOPIC COLOSTOMY REVERSAL SPLENIC FLEXURE MOBILIZATION AND PARTIAL COLECTOMY Patient Active Problem List   Diagnosis Date Noted  . 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 S/P colostomy 03/02/2015  . Colon perforation 02/20/2015  . Constipation 12/19/2014  . Colon cancer screening 12/19/2014  . Ingestion of foreign substance 11/06/2014  . Ingestion of substance 11/06/2014  . Neuropathy of foot 12/10/2013  . Skin lesion 11/12/2011  . Corn of toe 10/18/2011  . TOBACCO DEPENDENCE 01/19/2007  . HEARING LOSS NOS OR DEAFNESS 01/19/2007  . MENOPAUSAL  SYNDROME 01/19/2007    Expected post op course  Plan: Cont clears as tolerated Ambulate Cont to change dressing BID Will increase IVF's since pt isn't drinking much   LOS: 2 days     .Vanita Panda, MD [facility], [place] [phone]   07/04/2015 8:21 AM

## 2015-07-04 NOTE — Progress Notes (Signed)
Initial Nutrition Assessment  DOCUMENTATION CODES:   Severe malnutrition in context of chronic illness, Underweight  INTERVENTION:  - Will order Boost Breeze BID, each supplement provides 250 kcal and 9 grams of protein - Diet advancement as medically feasible - RD will continue to monitor for needs  NUTRITION DIAGNOSIS:   Inadequate oral intake related to other (see comment) (current diet order) as evidenced by other (see comment) (CLD does not meet estimated needs).  GOAL:   Patient will meet greater than or equal to 90% of their needs  MONITOR:   Diet advancement, PO intake, Supplement acceptance, Weight trends, Labs, I & O's  REASON FOR ASSESSMENT:   Other (Comment) (underweight BMI)  ASSESSMENT:   54 year old [name] [name] female who experienced a colon perforation during a screening colonoscopy. She underwent a colostomy due to a difficult resection. Her f/u colonoscopy was normal. She is here today for reversal.    Pt seen for underweight BMI. Pt on CLD POD #2 lap colostomy reversal. Spoke with pt's daughter as pt was sleeping at time of visit. Pt had a little bit of broth before going to sleep. She had a very good appetite PTA and daughter denies weight changes PTA siting pt was eating well.  Per weight hx review, pt has lost 7 lbs (9% body weight) in the past 8 months which is not clinically significant for time frame but RD feels this is significant for this pt given weight prior to losing weight was 79 lbs.   Severe muscle and fat wasting noted. Not able to meet needs on CLD; will order Boost Breeze BID to supplement. Medications reviewed. Labs reviewed.    Diet Order:  Diet clear liquid Room service appropriate?: Yes; Fluid consistency:: Thin  Skin:  Wound (see comment) (abdominal surgical wound)  Last BM:  8/9  Height:   Ht Readings from Last 1 Encounters:  07/02/15  (1.397 m)    Weight:   Wt Readings from Last 1 Encounters:  07/02/15 72 lb 2  oz (32.716 kg)    Ideal Body Weight:  40.91 kg (kg)  BMI:  Body mass index is 16.76 kg/(m^2).  Estimated Nutritional Needs:   Kcal:  1610-9604  Protein:  50-60 grams  Fluid:  1.7 L/day  EDUCATION NEEDS:   No education needs identified at this time     [name] [name], RD, LDN Inpatient Clinical Dietitian Pager # [phone] After hours/weekend pager # [phone]

## 2015-07-04 NOTE — Progress Notes (Signed)
It was reported RN by nurse tech that patient had blood clot in urine this morning.  RN did not observe this, but reported to oncoming nurse to monitor for any new occurrences.  Marcia Brash, RN 07/04/15,  1610.

## 2015-07-05 LAB — CBC
HEMATOCRIT: 30.9 % — AB (ref 36.0–46.0)
Hemoglobin: 9.4 g/dL — ABNORMAL LOW (ref 12.0–15.0)
MCH: 21.1 pg — ABNORMAL LOW (ref 26.0–34.0)
MCHC: 30.4 g/dL (ref 30.0–36.0)
MCV: 69.3 fL — ABNORMAL LOW (ref 78.0–100.0)
PLATELETS: 176 10*3/uL (ref 150–400)
RBC: 4.46 MIL/uL (ref 3.87–5.11)
RDW: 15 % (ref 11.5–15.5)
WBC: 7.4 10*3/uL (ref 4.0–10.5)

## 2015-07-05 LAB — BASIC METABOLIC PANEL
Anion gap: 5 (ref 5–15)
BUN: 6 mg/dL (ref 6–20)
CHLORIDE: 100 mmol/L — AB (ref 101–111)
CO2: 30 mmol/L (ref 22–32)
CREATININE: 0.55 mg/dL (ref 0.44–1.00)
Calcium: 9.3 mg/dL (ref 8.9–10.3)
Glucose, Bld: 112 mg/dL — ABNORMAL HIGH (ref 65–99)
Potassium: 4.2 mmol/L (ref 3.5–5.1)
SODIUM: 135 mmol/L (ref 135–145)

## 2015-07-05 MED ORDER — KETOROLAC TROMETHAMINE 15 MG/ML IJ SOLN
15.0000 mg | Freq: Four times a day (QID) | INTRAMUSCULAR | Status: DC
  Administered 2015-07-05 – 2015-07-07 (×8): 15 mg via INTRAVENOUS
  Filled 2015-07-05 (×11): qty 1

## 2015-07-05 NOTE — Progress Notes (Signed)
3 Days Post-Op lap colostomy reversal Subjective: Doing well, having crampy lower abd pain after drinking, passing some blood from rectum, no more urinary issues  Objective: Vital signs in last 24 hours: Temp:  [98.3 F (36.8 C)-99.2 F (37.3 C)] 98.3 F (36.8 C) (08/13 1031) Pulse Rate:  [66-79] 66 (08/13 1031) Resp:  [14-29] 16 (08/13 1031) BP: (116-128)/(70-76) 128/72 mmHg (08/13 1031) SpO2:  [98 %-100 %] 100 % (08/13 1031)   Intake/Output from previous day: 08/12 0701 - 08/13 0700 In: 1547.5 [P.O.:120; I.V.:1427.5] Out: 2020 [Urine:2020] Intake/Output this shift: Total I/O In: -  Out: 300 [Urine:300]   General appearance: alert and cooperative GI: normal findings: soft, non-tender, non-distended  Incision: slight drainage present  Lab Results:   Recent Labs  07/04/15 0450 07/05/15 0535  WBC 8.9 7.4  HGB 9.5* 9.4*  HCT 30.7* 30.9*  PLT 168 176   BMET  Recent Labs  07/04/15 0450 07/05/15 0535  NA 136 135  K 3.6 4.2  CL 101 100*  CO2 28 30  GLUCOSE 90 112*  BUN 6 6  CREATININE 0.58 0.55  CALCIUM 9.1 9.3   PT/INR No results for input(s): LABPROT, INR in the last 72 hours. ABG No results for input(s): PHART, HCO3 in the last 72 hours.  Invalid input(s): PCO2, PO2  MEDS, Scheduled . alvimopan  12 mg Oral BID  . enoxaparin (LOVENOX) injection  30 mg Subcutaneous Q24H  . feeding supplement  1 Container Oral BID BM  . ketorolac  15 mg Intravenous 4 times per day  . morphine   Intravenous 6 times per day  . pantoprazole  40 mg Oral Daily    Studies/Results: No results found.  Assessment: s/p Procedure(s): LAPAROSCOPIC COLOSTOMY REVERSAL SPLENIC FLEXURE MOBILIZATION AND PARTIAL COLECTOMY Patient Active Problem List   Diagnosis Date Noted  . 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 S/P colostomy 03/02/2015  . Colon perforation 02/20/2015  . Constipation 12/19/2014  . Colon cancer screening 12/19/2014  . Ingestion of foreign substance 11/06/2014   . Ingestion of substance 11/06/2014  . Neuropathy of foot 12/10/2013  . Skin lesion 11/12/2011  . Corn of toe 10/18/2011  . TOBACCO DEPENDENCE 01/19/2007  . HEARING LOSS NOS OR DEAFNESS 01/19/2007  . MENOPAUSAL SYNDROME 01/19/2007    Expected post op course  Plan: Cont clears as tolerated Ambulate Cont to change dressing BID Will decrease IVF's again   LOS: 3 days     .Vanita Panda, MD [facility], [place] [phone]   07/05/2015 11:00 AM

## 2015-07-06 NOTE — Progress Notes (Signed)
4 Days Post-Op lap colostomy reversal Subjective: Doing well, having less crampy lower abd pain, having BM's and passing flatus, urinating well  Objective: Vital signs in last 24 hours: Temp:  [97.3 F (36.3 C)-98.5 F (36.9 C)] 98 F (36.7 C) (08/14 0630) Pulse Rate:  [66-78] 74 (08/14 0630) Resp:  [15-18] 18 (08/14 0630) BP: (104-130)/(62-78) 128/74 mmHg (08/14 0630) SpO2:  [94 %-100 %] 100 % (08/14 0630)   Intake/Output from previous day: 08/13 0701 - 08/14 0700 In: 1035 [P.O.:200; I.V.:835] Out: 1850 [Urine:1850] Intake/Output this shift:     General appearance: alert and cooperative GI: normal findings: soft, non-tender, non-distended  Incision: slight drainage present  Lab Results:   Recent Labs  07/04/15 0450 07/05/15 0535  WBC 8.9 7.4  HGB 9.5* 9.4*  HCT 30.7* 30.9*  PLT 168 176   BMET  Recent Labs  07/04/15 0450 07/05/15 0535  NA 136 135  K 3.6 4.2  CL 101 100*  CO2 28 30  GLUCOSE 90 112*  BUN 6 6  CREATININE 0.58 0.55  CALCIUM 9.1 9.3   PT/INR No results for input(s): LABPROT, INR in the last 72 hours. ABG No results for input(s): PHART, HCO3 in the last 72 hours.  Invalid input(s): PCO2, PO2  MEDS, Scheduled . enoxaparin (LOVENOX) injection  30 mg Subcutaneous Q24H  . feeding supplement  1 Container Oral BID BM  . ketorolac  15 mg Intravenous 4 times per day  . pantoprazole  40 mg Oral Daily    Studies/Results: No results found.  Assessment: s/p Procedure(s): LAPAROSCOPIC COLOSTOMY REVERSAL SPLENIC FLEXURE MOBILIZATION AND PARTIAL COLECTOMY Patient Active Problem List   Diagnosis Date Noted  . 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 S/P colostomy 03/02/2015  . Colon perforation 02/20/2015  . Constipation 12/19/2014  . Colon cancer screening 12/19/2014  . Ingestion of foreign substance 11/06/2014  . Ingestion of substance 11/06/2014  . Neuropathy of foot 12/10/2013  . Skin lesion 11/12/2011  . Corn of toe 10/18/2011  . TOBACCO  DEPENDENCE 01/19/2007  . HEARING LOSS NOS OR DEAFNESS 01/19/2007  . MENOPAUSAL SYNDROME 01/19/2007    Expected post op course  Plan: Advance to reg diet Ambulate Cont to change dressing BID SL IVF's D/C PCA and transition to PO narcotics Possible d/c tom   LOS: 4 days     .Vanita Panda, MD [facility], [place] [phone]   07/06/2015 9:08 AM

## 2015-07-07 MED ORDER — OXYCODONE-ACETAMINOPHEN 5-325 MG PO TABS: 1.0000 | ORAL_TABLET | ORAL | Status: DC | PRN

## 2015-07-07 MED ORDER — MORPHINE SULFATE 2 MG/ML IJ SOLN: 2.0000 mg | INTRAMUSCULAR | Status: DC | PRN

## 2015-07-07 NOTE — Discharge Instructions (Addendum)
ABDOMINAL SURGERY: POST OP INSTRUCTIONS  1. DIET: Follow a light bland diet the first 24 hours after arrival home, such as soup, liquids, crackers, etc.  Be sure to include lots of fluids daily.  Avoid fast food or heavy meals as your are more likely to get nauseated.  Do not eat any uncooked fruits or vegetables for the next 2 weeks as your colon heals. 2. Take your usually prescribed home medications unless otherwise directed. 3. PAIN CONTROL: a. Pain is best controlled by a usual combination of three different methods TOGETHER: i. Ice/Heat ii. Over the counter pain medication iii. Prescription pain medication b. Most patients will experience some swelling and bruising around the incisions.  Ice packs or heating pads (30-60 minutes up to 6 times a day) will help. Use ice for the first few days to help decrease swelling and bruising, then switch to heat to help relax tight/sore spots and speed recovery.  Some people prefer to use ice alone, heat alone, alternating between ice & heat.  Experiment to what works for you.  Swelling and bruising can take several weeks to resolve.   c. It is helpful to take an over-the-counter pain medication regularly for the first few weeks.  Choose one of the following that works best for you: i. Naproxen (Aleve, etc)  Two  tabs twice a day ii. Ibuprofen (Advil, etc) Three  tabs four times a day (every meal & bedtime) iii. Acetaminophen (Tylenol, etc) 500-650mg  four times a day (every meal & bedtime) d. A  prescription for pain medication (such as oxycodone, hydrocodone, etc) should be given to you upon discharge.  Take your pain medication as prescribed.  i. If you are having problems/concerns with the prescription medicine (does not control pain, nausea, vomiting, rash, itching, etc), please call us [phone] to see if we need to switch you to a different pain medicine that will work better for you and/or control your side effect better. ii. If you  need a refill on your pain medication, please contact your pharmacy.  They will contact our office to request authorization. Prescriptions will not be filled after 5 pm or on week-ends. 4. Avoid getting constipated.  Between the surgery and the pain medications, it is common to experience some constipation.  Increasing fluid intake and taking a fiber supplement (such as Metamucil, Citrucel, FiberCon, MiraLax, etc) 1-2 times a day regularly will usually help prevent this problem from occurring.  A mild laxative (prune juice, Milk of Magnesia, MiraLax, etc) should be taken according to package directions if there are no bowel movements after 48 hours.   5. Watch out for diarrhea.  If you have many loose bowel movements, simplify your diet to bland foods & liquids for a few days.  Stop any stool softeners and decrease your fiber supplement.  Switching to mild anti-diarrheal medications (Kayopectate, Pepto Bismol) can help.  If this worsens or does not improve, please call us. 6. Wash / shower every day.  You may shower over the incision / wound.  Avoid baths until the skin is fully healed.  Continue to shower over incision(s) after the dressing is off. 7. Change dressing daily.  Clean incisions with soap and water.  Ok to shower, but no tub baths. 8. ACTIVITIES as tolerated:   a. You may resume regular (light) daily activities beginning the next day--such as daily self-care, walking, climbing stairs--gradually increasing activities as tolerated.  If you can walk 30 minutes without difficulty, it is safe to  try more intense activity such as jogging, treadmill, bicycling, low-impact aerobics, swimming, etc. b. Save the most intensive and strenuous activity for last such as sit-ups, heavy lifting, contact sports, etc  Refrain from any heavy lifting or straining until you are off narcotics for pain control.   c. DO NOT PUSH THROUGH PAIN.  Let pain be your guide: If it hurts to do something, don't do it.  Pain is  your body warning you to avoid that activity for another week until the pain goes down. d. You may drive when you are no longer taking prescription pain medication, you can comfortably wear a seatbelt, and you can safely maneuver your car and apply brakes. e. Bonita Quin may have sexual intercourse when it is comfortable.  9. FOLLOW UP in our office a. Please call [facility] at [phone] to set up an appointment to see your surgeon in the office for a follow-up appointment approximately 1-2 weeks after your surgery. b. Make sure that you call for this appointment the day you arrive home to insure a convenient appointment time. 10. IF YOU HAVE DISABILITY OR FAMILY LEAVE FORMS, BRING THEM TO THE OFFICE FOR PROCESSING.  DO NOT GIVE THEM TO YOUR DOCTOR.   WHEN TO CALL us [phone]: 1. Poor pain control 2. Reactions / problems with new medications (rash/itching, nausea, etc)  3. Fever over 101.5 F (38.5 C) 4. Inability to urinate 5. Nausea and/or vomiting 6. Worsening swelling or bruising 7. Continued bleeding from incision. 8. Increased pain, redness, or drainage from the incision  The clinic staff is available to answer your questions during regular business hours (8:30am-5pm).  Please dont hesitate to call and ask to speak to one of our nurses for clinical concerns.   A surgeon from [facility] is always on call at the hospitals   If you have a medical emergency, go to the nearest emergency room or call 911.    [facility] [address], [place], [place]  [address] ? MAIN: [phone] ? TOLL FREE: [phone] ? FAX [phone] [email or web address]

## 2015-07-07 NOTE — Progress Notes (Signed)
5 Days Post-Op lap colostomy reversal Subjective: Doing well, having less burning lower abd pain, having BM's and passing flatus, urinating well, eating some, pain controlled except when she coughs  Objective: Vital signs in last 24 hours: Temp:  [98.4 F (36.9 C)-98.7 F (37.1 C)] 98.7 F (37.1 C) (08/15 0630) Pulse Rate:  [62-72] 62 (08/15 0630) Resp:  [14-18] 15 (08/15 0630) BP: (112-118)/(65-74) 112/65 mmHg (08/15 0630) SpO2:  [99 %-100 %] 100 % (08/15 0630)   Intake/Output from previous day:   Intake/Output this shift:   General appearance: alert and cooperative GI: normal findings: soft, non-tender, non-distended  Incision: slight drainage present, wick removed.  Dressing replaced  Lab Results:   Recent Labs  07/05/15 0535  WBC 7.4  HGB 9.4*  HCT 30.9*  PLT 176   BMET  Recent Labs  07/05/15 0535  NA 135  K 4.2  CL 100*  CO2 30  GLUCOSE 112*  BUN 6  CREATININE 0.55  CALCIUM 9.3   PT/INR No results for input(s): LABPROT, INR in the last 72 hours. ABG No results for input(s): PHART, HCO3 in the last 72 hours.  Invalid input(s): PCO2, PO2  MEDS, Scheduled . enoxaparin (LOVENOX) injection  30 mg Subcutaneous Q24H  . feeding supplement  1 Container Oral BID BM  . ketorolac  15 mg Intravenous 4 times per day  . pantoprazole  40 mg Oral Daily    Studies/Results: No results found.  Assessment: s/p Procedure(s): LAPAROSCOPIC COLOSTOMY REVERSAL SPLENIC FLEXURE MOBILIZATION AND PARTIAL COLECTOMY Patient Active Problem List   Diagnosis Date Noted  . 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 S/P colostomy 03/02/2015  . Colon perforation 02/20/2015  . Constipation 12/19/2014  . Colon cancer screening 12/19/2014  . Ingestion of foreign substance 11/06/2014  . Ingestion of substance 11/06/2014  . Neuropathy of foot 12/10/2013  . Skin lesion 11/12/2011  . Corn of toe 10/18/2011  . TOBACCO DEPENDENCE 01/19/2007  . HEARING LOSS NOS OR DEAFNESS 01/19/2007   . MENOPAUSAL SYNDROME 01/19/2007    Expected post op course  Plan: Cont reg diet Ambulate change dressing daily SL IVF's Cont PO narcotics Possible d/c later today or tom   LOS: 5 days     .Vanita Panda, MD [facility], [place] [phone]   07/07/2015 7:41 AM

## 2015-07-11 NOTE — Discharge Summary (Signed)
  Patient ID: [name] [name] [id number] 54 y.o. 08/03/1961  07/02/2015  Discharge date and time: 07/07/2015 11:41 AM  Admitting Physician: Vanita Panda  Discharge Physician: Vanita Panda.  Admission Diagnoses: perforated colon, colostomy stricture  Discharge Diagnoses: same  Operations: Procedure(s): LAPAROSCOPIC COLOSTOMY REVERSAL SPLENIC FLEXURE MOBILIZATION AND PARTIAL COLECTOMY    Discharged Condition: good    Hospital Course: patient was admitted to the hospital after surgical repair of her colostomy.  Her diet was advanced as tolerated.  Her Foley was removed on postop day 2. She slowly transitioned to oral pain medications.  Once she was tolerating a diet and by mouth narcotics, she was discharged home in stable condition.  She'll follow-up in the office in 2-3 weeks.  Consults: None  Significant Diagnostic Studies: labs: CBC and chemistries  Treatments: IV hydration, analgesia: pCA and surgery: see above  Disposition: Home

## 2015-07-15 ENCOUNTER — Encounter: Payer: Self-pay | Admitting: Gastroenterology

## 2015-07-15 ENCOUNTER — Observation Stay (HOSPITAL_COMMUNITY)
Admission: EM | Admit: 2015-07-15 | Discharge: 2015-07-17 | Disposition: A | Discharge status: Home / Self Care | Payer: 59 | Attending: General Surgery | Admitting: General Surgery

## 2015-07-15 ENCOUNTER — Emergency Department (HOSPITAL_COMMUNITY): Payer: 59

## 2015-07-15 ENCOUNTER — Encounter (HOSPITAL_COMMUNITY): Payer: Self-pay | Admitting: *Deleted

## 2015-07-15 DIAGNOSIS — Z800 Family history of malignant neoplasm of digestive organs: Secondary | ICD-10-CM | POA: Diagnosis not present

## 2015-07-15 DIAGNOSIS — K5900 Constipation, unspecified: Secondary | ICD-10-CM | POA: Insufficient documentation

## 2015-07-15 DIAGNOSIS — N951 Menopausal and female climacteric states: Secondary | ICD-10-CM | POA: Insufficient documentation

## 2015-07-15 DIAGNOSIS — F17210 Nicotine dependence, cigarettes, uncomplicated: Secondary | ICD-10-CM | POA: Insufficient documentation

## 2015-07-15 DIAGNOSIS — Z681 Body mass index (BMI) 19 or less, adult: Secondary | ICD-10-CM | POA: Insufficient documentation

## 2015-07-15 DIAGNOSIS — H9190 Unspecified hearing loss, unspecified ear: Secondary | ICD-10-CM | POA: Diagnosis not present

## 2015-07-15 DIAGNOSIS — E46 Unspecified protein-calorie malnutrition: Secondary | ICD-10-CM | POA: Diagnosis not present

## 2015-07-15 DIAGNOSIS — K6289 Other specified diseases of anus and rectum: Principal | ICD-10-CM | POA: Insufficient documentation

## 2015-07-15 DIAGNOSIS — G629 Polyneuropathy, unspecified: Secondary | ICD-10-CM | POA: Insufficient documentation

## 2015-07-15 DIAGNOSIS — R1030 Lower abdominal pain, unspecified: Secondary | ICD-10-CM | POA: Diagnosis present

## 2015-07-15 LAB — URINALYSIS, ROUTINE W REFLEX MICROSCOPIC
Bilirubin Urine: NEGATIVE
GLUCOSE, UA: NEGATIVE mg/dL
Hgb urine dipstick: NEGATIVE
KETONES UR: 15 mg/dL — AB
Leukocytes, UA: NEGATIVE
NITRITE: NEGATIVE
PH: 7 (ref 5.0–8.0)
PROTEIN: NEGATIVE mg/dL
Specific Gravity, Urine: 1.004 — ABNORMAL LOW (ref 1.005–1.030)
Urobilinogen, UA: 0.2 mg/dL (ref 0.0–1.0)

## 2015-07-15 LAB — COMPREHENSIVE METABOLIC PANEL
ALT: 13 U/L — ABNORMAL LOW (ref 14–54)
ANION GAP: 10 (ref 5–15)
AST: 18 U/L (ref 15–41)
Albumin: 4 g/dL (ref 3.5–5.0)
Alkaline Phosphatase: 77 U/L (ref 38–126)
BUN: 6 mg/dL (ref 6–20)
CHLORIDE: 102 mmol/L (ref 101–111)
CO2: 28 mmol/L (ref 22–32)
CREATININE: 0.59 mg/dL (ref 0.44–1.00)
Calcium: 9.6 mg/dL (ref 8.9–10.3)
Glucose, Bld: 89 mg/dL (ref 65–99)
POTASSIUM: 3.3 mmol/L — AB (ref 3.5–5.1)
SODIUM: 140 mmol/L (ref 135–145)
Total Bilirubin: 0.8 mg/dL (ref 0.3–1.2)
Total Protein: 7 g/dL (ref 6.5–8.1)

## 2015-07-15 LAB — CBC
HCT: 32.5 % — ABNORMAL LOW (ref 36.0–46.0)
HEMOGLOBIN: 10.4 g/dL — AB (ref 12.0–15.0)
MCH: 21.7 pg — AB (ref 26.0–34.0)
MCHC: 32 g/dL (ref 30.0–36.0)
MCV: 67.8 fL — AB (ref 78.0–100.0)
PLATELETS: 389 10*3/uL (ref 150–400)
RBC: 4.79 MIL/uL (ref 3.87–5.11)
RDW: 14.9 % (ref 11.5–15.5)
WBC: 6.4 10*3/uL (ref 4.0–10.5)

## 2015-07-15 LAB — LIPASE, BLOOD: LIPASE: 19 U/L — AB (ref 22–51)

## 2015-07-15 MED ORDER — OXYCODONE HCL 5 MG PO TABS: 5.0000 mg | ORAL_TABLET | ORAL | Status: DC | PRN

## 2015-07-15 MED ORDER — IOHEXOL 300 MG/ML  SOLN
25.0000 mL | INTRAMUSCULAR | Status: AC
  Administered 2015-07-15: 25 mL via ORAL

## 2015-07-15 MED ORDER — IOHEXOL 300 MG/ML  SOLN
100.0000 mL | Freq: Once | INTRAMUSCULAR | Status: AC | PRN
  Administered 2015-07-15: 100 mL via INTRAVENOUS

## 2015-07-15 MED ORDER — HEPARIN SODIUM (PORCINE) 5000 UNIT/ML IJ SOLN
5000.0000 [IU] | Freq: Three times a day (TID) | INTRAMUSCULAR | Status: DC
Start: 2015-07-15 — End: 2015-07-17
  Administered 2015-07-15 – 2015-07-17 (×4): 5000 [IU] via SUBCUTANEOUS
  Filled 2015-07-15 (×3): qty 1

## 2015-07-15 MED ORDER — PANTOPRAZOLE SODIUM 40 MG PO TBEC
40.0000 mg | DELAYED_RELEASE_TABLET | Freq: Every day | ORAL | Status: DC
  Administered 2015-07-15 – 2015-07-17 (×3): 40 mg via ORAL
  Filled 2015-07-15 (×3): qty 1

## 2015-07-15 MED ORDER — ONDANSETRON HCL 4 MG/2ML IJ SOLN: 4.0000 mg | Freq: Four times a day (QID) | INTRAMUSCULAR | Status: DC | PRN

## 2015-07-15 MED ORDER — PANTOPRAZOLE SODIUM 40 MG IV SOLR: 40.0000 mg | Freq: Every day | INTRAVENOUS | Status: DC

## 2015-07-15 MED ORDER — METRONIDAZOLE 500 MG PO TABS
500.0000 mg | ORAL_TABLET | Freq: Three times a day (TID) | ORAL | Status: DC
  Administered 2015-07-15 – 2015-07-17 (×5): 500 mg via ORAL
  Filled 2015-07-15 (×4): qty 1

## 2015-07-15 MED ORDER — OXYCODONE-ACETAMINOPHEN 5-325 MG PO TABS
1.0000 | ORAL_TABLET | ORAL | Status: DC | PRN
  Administered 2015-07-15 – 2015-07-17 (×2): 1 via ORAL
  Filled 2015-07-15 (×3): qty 1

## 2015-07-15 MED ORDER — KCL IN DEXTROSE-NACL 20-5-0.9 MEQ/L-%-% IV SOLN
INTRAVENOUS | Status: DC
  Administered 2015-07-15: 21:00:00 via INTRAVENOUS
  Filled 2015-07-15 (×3): qty 1000

## 2015-07-15 MED ORDER — ONDANSETRON 4 MG PO TBDP: 4.0000 mg | ORAL_TABLET | Freq: Four times a day (QID) | ORAL | Status: DC | PRN

## 2015-07-15 NOTE — ED Notes (Signed)
Patient cannot urinate at this time; will check back with patient; visitor given a warm blanket; no needs at this time

## 2015-07-15 NOTE — ED Notes (Signed)
Patient resting on stretcher; visitor at bedside; no needs at this time

## 2015-07-15 NOTE — ED Notes (Signed)
Pt ate 4 bites of [place] sandwich and 1 graham cracker. Pt instructed to remain NPO from this point until told otherwise by nursing staff.

## 2015-07-15 NOTE — ED Notes (Signed)
Pt reports colonoscopy last Monday.  States that she has had diarhea 3+ times a day. Pt also reports lower abdominal pain . Pt denies any blood or dark stools.

## 2015-07-15 NOTE — ED Notes (Signed)
Pt given turkey sandwich

## 2015-07-15 NOTE — H&P (Signed)
[name] [name] is an 54 y.o. female.   Chief Complaint: abdominal pain HPI:  The pt is a 54yo female who is almost 2 weeks s/p colostomy takedown. She has been having some lower abdominal pain. No fever. No nausea  Past Medical History  Diagnosis Date  . Constipation   . Constipation   . Difficulty sleeping     Past Surgical History  Procedure Laterality Date  . Cesarean section  1992  . Appendectomy  1993  . Dilation and curettage of uterus  1990    "miscarriage"  . Laparotomy N/A 02/20/2015    Procedure: PARTIAL COLECTOMY END  ILEOSTOMY AND HARTMAN'S PROCEDURE;  Surgeon: Leighton Ruff, MD;  Location: [facility];  Service: General;  Laterality: N/A;  . Colonoscopy N/A 05/30/2015    Procedure: COLONOSCOPY;  Surgeon: Leighton Ruff, MD;  Location: [facility];  Service: Endoscopy;  Laterality: N/A;  . Colostomy  01/2015  . Colonoscopy with propofol N/A 06/12/2015    Procedure: COLONOSCOPY WITH PROPOFOL;  Surgeon: Leighton Ruff, MD;  Location: [facility];  Service: Endoscopy;  Laterality: N/A;  . Colostomy takedown N/A 07/02/2015    Procedure: LAPAROSCOPIC COLOSTOMY REVERSAL SPLENIC FLEXURE MOBILIZATION AND PARTIAL COLECTOMY;  Surgeon: Leighton Ruff, MD;  Location: [facility];  Service: General;  Laterality: N/A;    Family History  Problem Relation Age of Onset  . Colon cancer Neg Hx    Social History:  reports that she has been smoking Cigarettes.  She has a 18.5 pack-year smoking history. She has never used smokeless tobacco. She reports that she does not drink alcohol or use illicit drugs.  Allergies: No Known Allergies   (Not in a hospital admission)  Results for orders placed or performed during the hospital encounter of 07/15/15 (from the past 48 hour(s))  Lipase, blood     Status: Abnormal   Collection Time: 07/15/15 11:10 AM  Result Value Ref Range   Lipase 19 (L) 22 - 51 U/L  Comprehensive metabolic panel     Status: Abnormal   Collection Time: 07/15/15 11:10 AM  Result Value Ref  Range   Sodium 140 135 - 145 mmol/L   Potassium 3.3 (L) 3.5 - 5.1 mmol/L   Chloride 102 101 - 111 mmol/L   CO2 28 22 - 32 mmol/L   Glucose, Bld 89 65 - 99 mg/dL   BUN 6 6 - 20 mg/dL   Creatinine, Ser 0.59 0.44 - 1.00 mg/dL   Calcium 9.6 8.9 - 10.3 mg/dL   Total Protein 7.0 6.5 - 8.1 g/dL   Albumin 4.0 3.5 - 5.0 g/dL   AST 18 15 - 41 U/L   ALT 13 (L) 14 - 54 U/L   Alkaline Phosphatase 77 38 - 126 U/L   Total Bilirubin 0.8 0.3 - 1.2 mg/dL   GFR calc non Af Amer >60 >60 mL/min   GFR calc Af Amer >60 >60 mL/min    Comment: (NOTE) The eGFR has been calculated using the CKD EPI equation. This calculation has not been validated in all clinical situations. eGFR's persistently <60 mL/min signify possible Chronic Kidney Disease.    Anion gap 10 5 - 15  CBC     Status: Abnormal   Collection Time: 07/15/15 11:10 AM  Result Value Ref Range   WBC 6.4 4.0 - 10.5 K/uL   RBC 4.79 3.87 - 5.11 MIL/uL   Hemoglobin 10.4 (L) 12.0 - 15.0 g/dL   HCT 32.5 (L) 36.0 - 46.0 %   MCV 67.8 (L) 78.0 -  100.0 fL   MCH 21.7 (L) 26.0 - 34.0 pg   MCHC 32.0 30.0 - 36.0 g/dL   RDW 14.9 11.5 - 15.5 %   Platelets 389 150 - 400 K/uL  Urinalysis, Routine w reflex microscopic (not at [facility])     Status: Abnormal   Collection Time: 07/15/15  1:30 PM  Result Value Ref Range   Color, Urine YELLOW YELLOW   APPearance CLEAR CLEAR   Specific Gravity, Urine 1.004 (L) 1.005 - 1.030   pH 7.0 5.0 - 8.0   Glucose, UA NEGATIVE NEGATIVE mg/dL   Hgb urine dipstick NEGATIVE NEGATIVE   Bilirubin Urine NEGATIVE NEGATIVE   Ketones, ur 15 (A) NEGATIVE mg/dL   Protein, ur NEGATIVE NEGATIVE mg/dL   Urobilinogen, UA 0.2 0.0 - 1.0 mg/dL   Nitrite NEGATIVE NEGATIVE   Leukocytes, UA NEGATIVE NEGATIVE    Comment: MICROSCOPIC NOT DONE ON URINES WITH NEGATIVE PROTEIN, BLOOD, LEUKOCYTES, NITRITE, OR GLUCOSE <1000 mg/dL.   Ct Abdomen Pelvis W Contrast  07/15/2015   CLINICAL DATA:  Patient with prior colonoscopy. Multiple episodes of  diarrhea. Lower abdominal pain.  EXAM: CT ABDOMEN AND PELVIS WITH CONTRAST  TECHNIQUE: Multidetector CT imaging of the abdomen and pelvis was performed using the standard protocol following bolus administration of intravenous contrast.  CONTRAST:  80 mL Omnipaque 300  COMPARISON:  CT abdomen pelvis 02/26/2015  FINDINGS: Lower chest: Normal heart size. No consolidative or nodular pulmonary opacities. No pleural effusion.  Hepatobiliary: Liver is normal in size and contour. No focal hepatic lesion identified. Gallbladder is decompressed. Suggestion of mild gallbladder wall thickening although motion artifact limits evaluation.  Pancreas: Unremarkable  Spleen: Re- demonstrated coarse calcification within the superior aspect of the spleen.  Adrenals/Urinary Tract: The adrenal glands are normal. Kidneys enhance symmetrically with contrast. No hydronephrosis. Urinary bladder is distended.  Stomach/Bowel: Patient status post reanastomosis with colonic anastomosis demonstrated in the pelvis. There is circumferential wall thickening of the rectum with perirectal fat stranding and small amount of fluid in the pelvis. No evidence for bowel obstruction. No free intraperitoneal air. Re- demonstrated multiple large calcifications involving the mesenteric, particularly within the right lower quadrant.  Vascular/Lymphatic: Normal caliber abdominal aorta. No retroperitoneal lymphadenopathy.  Other: None  Musculoskeletal: Lower lumbar spine degenerative changes. No aggressive or acute appearing osseous lesions.  IMPRESSION: Anastomotic sutures demonstrated within the distal colon. There is circumferential wall thickening and surrounding fat stranding of the distal rectum distal to the surgical anastomosis concerning for proctocolitis with considerations including infectious, inflammatory or ischemic etiologies. Small amount of the associated adjacent pelvic free fluid.  There is suggestion of persistent wall thickening of the  gallbladder which is limited in evaluation due to motion artifact. Consider correlation with clinical symptomatology, LFTs and right upper quadrant ultrasound as needed.   Electronically Signed   By: Lovey Newcomer M.D.   On: 07/15/2015 15:49    Review of Systems  Constitutional: Negative.   HENT: Negative.   Eyes: Negative.   Respiratory: Negative.   Cardiovascular: Negative.   Gastrointestinal: Positive for abdominal pain.  Genitourinary: Negative.   Musculoskeletal: Negative.   Skin: Negative.   Neurological: Negative.   Endo/Heme/Allergies: Negative.   Psychiatric/Behavioral: Negative.     Blood pressure 137/85, pulse 57, temperature 98.5 F (36.9 C), temperature source Oral, resp. rate 14, height _0  (1.499 m), weight 32.659 kg (72 lb), SpO2 100 %. Physical Exam  Constitutional: She is oriented to person, place, and time. She appears well-developed and well-nourished.  HENT:  Head: Normocephalic and atraumatic.  Eyes: Conjunctivae and EOM are normal. Pupils are equal, round, and reactive to light.  Neck: Normal range of motion. Neck supple.  Cardiovascular: Normal rate, regular rhythm and normal heart sounds.   Respiratory: Effort normal and breath sounds normal.  GI: Soft. Bowel sounds are normal.  There is mild tenderness over lower abdomen. No peritontis  Musculoskeletal: Normal range of motion.  Neurological: She is alert and oriented to person, place, and time.  Skin: Skin is warm and dry.  Psychiatric: She has a normal mood and affect. Her behavior is normal.     Assessment/Plan The pt may have some postop proctitis but has no fever or wbc. i will admit for observation and start oral flagyl.   TOTH III,Dionna Wiedemann S 07/15/2015, 4:58 PM

## 2015-07-15 NOTE — ED Notes (Signed)
Patient has returned from using the bathroom; patient is on monitor, continuous pulse oximetry and blood pressure cuff; visitor at bedside; warm blanket given

## 2015-07-15 NOTE — ED Notes (Signed)
Patient transported to CT 

## 2015-07-15 NOTE — ED Notes (Signed)
Ordered Clear Liquid tray @ 1816

## 2015-07-15 NOTE — ED Notes (Signed)
Patient and visitor are both aware of need of urine specimen

## 2015-07-15 NOTE — ED Notes (Signed)
Brought patient back to room via wheelchair with family in tow; patient undressed and in a gown; patient stated "I need to have a bowel movement"; patient is in the bathroom at this time

## 2015-07-15 NOTE — ED Provider Notes (Signed)
CSN: [id number]     Arrival date & time 07/15/15  1191 History   First MD Initiated Contact with Patient 07/15/15 1058     Chief Complaint  Patient presents with  . Abdominal Pain  . Diarrhea     (Consider location/radiation/quality/duration/timing/severity/associated sxs/prior Treatment) HPI [name] [name] is a 54 y.o. female with recent hospitalization and August for sigmoid perforation, comes in today for evaluation of abdominal pain and diarrhea. Patient is A by her daughter who translates Montag nard. States patient had a colonoscopy last Monday, had been doing very well with formed stool and soft diet. States on Wednesday or Thursday she began to advance to solid foods and she began to experience lower abdominal discomfort, different from prior pain. She reports associated loose stools. She denies overtly foul smell, bloody diarrhea, fevers or chills. She denies any nausea or vomiting, dysuria, hematuria. No chest pain or shortness of breath, numbness or weakness.  Past Medical History  Diagnosis Date  . Constipation   . Constipation   . Difficulty sleeping    Past Surgical History  Procedure Laterality Date  . Cesarean section  1992  . Appendectomy  1993  . Dilation and curettage of uterus  1990    "miscarriage"  . Laparotomy N/A 02/20/2015    Procedure: PARTIAL COLECTOMY END  ILEOSTOMY AND HARTMAN'S PROCEDURE;  Surgeon: Romie Levee, MD;  Location: [facility];  Service: General;  Laterality: N/A;  . Colonoscopy N/A 05/30/2015    Procedure: COLONOSCOPY;  Surgeon: Romie Levee, MD;  Location: [facility];  Service: Endoscopy;  Laterality: N/A;  . Colostomy  01/2015  . Colonoscopy with propofol N/A 06/12/2015    Procedure: COLONOSCOPY WITH PROPOFOL;  Surgeon: Romie Levee, MD;  Location: [facility];  Service: Endoscopy;  Laterality: N/A;  . Colostomy takedown N/A 07/02/2015    Procedure: LAPAROSCOPIC COLOSTOMY REVERSAL SPLENIC FLEXURE MOBILIZATION AND PARTIAL COLECTOMY;  Surgeon: Romie Levee, MD;  Location: [facility];  Service: General;  Laterality: N/A;   Family History  Problem Relation Age of Onset  . Colon cancer Neg Hx    Social History  Substance Use Topics  . Smoking status: Current Every Day Smoker -- 0.50 packs/day for 37 years    Types: Cigarettes  . Smokeless tobacco: Never Used  . Alcohol Use: No   OB History    No data available     Review of Systems A 10 point review of systems was completed and was negative except for pertinent positives and negatives as mentioned in the history of present illness    Allergies  Review of patient's allergies indicates no known allergies.  Home Medications   Prior to Admission medications   Medication Sig Start Date End Date Taking? Authorizing Provider  acetaminophen (TYLENOL) 325 MG tablet Take 2 tablets (650 mg total) by mouth every 6 (six) hours as needed. 02/27/15   Sherrie George, PA-C  bisacodyl (DULCOLAX) 5 MG EC tablet Take 5 mg by mouth daily as needed for moderate constipation.    Historical Provider, MD  ferrous sulfate 325 (65 FE) MG tablet Take one with supper to help build up her blood.  It can make you constipated. You can buy over the counter, and do not need a prescription.  Ask pharmacist to help you find this. Patient not taking: Reported on 06/27/2015 02/27/15   Sherrie George, PA-C  metroNIDAZOLE (FLAGYL) 500 MG tablet Take 2 tablets by mouth as directed. take 2 tablets by mouth AT 2PM, 3PM, AND 10PM THE DAY  PRIOR TO YOUR COLON OPERATION 06/23/15   Historical Provider, MD  Multiple Vitamins-Iron (MULTIVITAMINS WITH IRON) TABS tablet Take 1 daily in AM with breakfast.  You can buy over the counter something like Women's One a day vitamin. Patient not taking: Reported on 06/27/2015 02/27/15   Sherrie George, PA-C  neomycin (MYCIFRADIN) 500 MG tablet TAKE 2 TABLETS BY MOUTH AT 2PM, 3PM AND 10PM THE DAY PRIOR TO SURGERY 06/23/15   Historical Provider, MD  oxyCODONE-acetaminophen (PERCOCET/ROXICET) 5-325 MG  per tablet Take 1-2 tablets by mouth every 4 (four) hours as needed for moderate pain. 07/07/15   Romie Levee, MD  pantoprazole (PROTONIX) 40 MG tablet Take 1 tablet (40 mg total) by mouth daily. 02/27/15   Sherrie George, PA-C  polyethylene glycol powder [facility]) powder Use if you have nothing from your colostomy in 24 hours.  If nothing after 36 hours call our office. Patient not taking: Reported on 06/27/2015 02/27/15   Sherrie George, PA-C   BP 137/85 mmHg  Pulse 57  Temp(Src) 98.5 F (36.9 C) (Oral)  Resp 14  Ht 4\' 11"  (1.499 m)  Wt 72 lb (32.659 kg)  BMI 14.53 kg/m2  SpO2 100% Physical Exam  Constitutional: She is oriented to person, place, and time. She appears well-developed and well-nourished.  Patient appears older than stated age  HENT:  Head: Normocephalic and atraumatic.  Mouth/Throat: Oropharynx is clear and moist.  Eyes: Conjunctivae are normal. Pupils are equal, round, and reactive to light. Right eye exhibits no discharge. Left eye exhibits no discharge. No scleral icterus.  Neck: Neck supple.  Cardiovascular: Normal rate, regular rhythm and normal heart sounds.   Pulmonary/Chest: Effort normal and breath sounds normal. No respiratory distress. She has no wheezes. She has no rales.  Abdominal: Soft.  Surgical incisions appear to be healing well. Patient with diffuse abdominal tenderness, worse over suprapubic region. No abdominal distention. No peritoneal signs.  Musculoskeletal: She exhibits no tenderness.  Neurological: She is alert and oriented to person, place, and time.  Cranial Nerves II-XII grossly intact  Skin: Skin is warm and dry. No rash noted.  Psychiatric: She has a normal mood and affect.  Nursing note and vitals reviewed.   ED Course  Procedures (including critical care time) Labs Review Labs Reviewed  LIPASE, BLOOD - Abnormal; Notable for the following:    Lipase 19 (*)    All other components within normal limits  COMPREHENSIVE  METABOLIC PANEL - Abnormal; Notable for the following:    Potassium 3.3 (*)    ALT 13 (*)    All other components within normal limits  CBC - Abnormal; Notable for the following:    Hemoglobin 10.4 (*)    HCT 32.5 (*)    MCV 67.8 (*)    MCH 21.7 (*)    All other components within normal limits  URINALYSIS, ROUTINE W REFLEX MICROSCOPIC (NOT AT [facility]) - Abnormal; Notable for the following:    Specific Gravity, Urine 1.004 (*)    Ketones, ur 15 (*)    All other components within normal limits    Imaging Review Ct Abdomen Pelvis W Contrast  07/15/2015   CLINICAL DATA:  Patient with prior colonoscopy. Multiple episodes of diarrhea. Lower abdominal pain.  EXAM: CT ABDOMEN AND PELVIS WITH CONTRAST  TECHNIQUE: Multidetector CT imaging of the abdomen and pelvis was performed using the standard protocol following bolus administration of intravenous contrast.  CONTRAST:  80 mL Omnipaque 300  COMPARISON:  CT abdomen pelvis 02/26/2015  FINDINGS:  Lower chest: Normal heart size. No consolidative or nodular pulmonary opacities. No pleural effusion.  Hepatobiliary: Liver is normal in size and contour. No focal hepatic lesion identified. Gallbladder is decompressed. Suggestion of mild gallbladder wall thickening although motion artifact limits evaluation.  Pancreas: Unremarkable  Spleen: Re- demonstrated coarse calcification within the superior aspect of the spleen.  Adrenals/Urinary Tract: The adrenal glands are normal. Kidneys enhance symmetrically with contrast. No hydronephrosis. Urinary bladder is distended.  Stomach/Bowel: Patient status post reanastomosis with colonic anastomosis demonstrated in the pelvis. There is circumferential wall thickening of the rectum with perirectal fat stranding and small amount of fluid in the pelvis. No evidence for bowel obstruction. No free intraperitoneal air. Re- demonstrated multiple large calcifications involving the mesenteric, particularly within the right lower quadrant.   Vascular/Lymphatic: Normal caliber abdominal aorta. No retroperitoneal lymphadenopathy.  Other: None  Musculoskeletal: Lower lumbar spine degenerative changes. No aggressive or acute appearing osseous lesions.  IMPRESSION: Anastomotic sutures demonstrated within the distal colon. There is circumferential wall thickening and surrounding fat stranding of the distal rectum distal to the surgical anastomosis concerning for proctocolitis with considerations including infectious, inflammatory or ischemic etiologies. Small amount of the associated adjacent pelvic free fluid.  There is suggestion of persistent wall thickening of the gallbladder which is limited in evaluation due to motion artifact. Consider correlation with clinical symptomatology, LFTs and right upper quadrant ultrasound as needed.   Electronically Signed   By: Annia Belt M.D.   On: 07/15/2015 15:49   I have personally reviewed and evaluated these images and lab results as part of my medical decision-making.   EKG Interpretation None     Meds given in ED:  Medications  iohexol (OMNIPAQUE) 300 MG/ML solution 25 mL (25 mLs Oral Contrast Given 07/15/15 1213)  iohexol (OMNIPAQUE) 300 MG/ML solution 100 mL (100 mLs Intravenous Contrast Given 07/15/15 1457)    New Prescriptions   No medications on file   Filed Vitals:   07/15/15 1212 07/15/15 1215 07/15/15 1415 07/15/15 1423  BP: 118/82 128/81 136/87 137/85  Pulse: 99 71 62 57  Temp:      TempSrc:      Resp: Height:      Weight:      SpO2: 99% 100% 100% 100%    MDM  Patient is a 54 year old [place] female who presents today for abdominal pain. Patient experienced colonic perforation after screening colonoscopy at the end of July and had colostomy reversal on 8/10. Patient presents today with new lower abdominal pain with associated diarrhea onset since Thursday In the ED she is afebrile, vital signs are stable, labs are noncontributory, CT abdomen shows  circumferential wall thickening and surrounding fat stranding of the distal rectum concerning for proctocolitis with considerations including infectious, inflammatory or ischemic etiologies. Given patient presentation, will consult with general surgery for further evaluation and management of patient's symptoms. Consult to Gen Surgery, Dr. Carolynne Edouard to see in ED. Pt admitted.  Final diagnoses:  Abdominal pain, lower        Joycie Peek, PA-C 07/15/15 1702  Arby Barrette, MD 07/30/15 [phone]

## 2015-07-16 LAB — BASIC METABOLIC PANEL
ANION GAP: 6 (ref 5–15)
BUN: 7 mg/dL (ref 6–20)
CALCIUM: 9 mg/dL (ref 8.9–10.3)
CO2: 29 mmol/L (ref 22–32)
CREATININE: 0.66 mg/dL (ref 0.44–1.00)
Chloride: 104 mmol/L (ref 101–111)
GLUCOSE: 88 mg/dL (ref 65–99)
Potassium: 3.7 mmol/L (ref 3.5–5.1)
Sodium: 139 mmol/L (ref 135–145)

## 2015-07-16 LAB — CBC
HCT: 29 % — ABNORMAL LOW (ref 36.0–46.0)
HEMOGLOBIN: 9.3 g/dL — AB (ref 12.0–15.0)
MCH: 21.9 pg — ABNORMAL LOW (ref 26.0–34.0)
MCHC: 32.1 g/dL (ref 30.0–36.0)
MCV: 68.2 fL — ABNORMAL LOW (ref 78.0–100.0)
PLATELETS: 341 10*3/uL (ref 150–400)
RBC: 4.25 MIL/uL (ref 3.87–5.11)
RDW: 15 % (ref 11.5–15.5)
WBC: 5.1 10*3/uL (ref 4.0–10.5)

## 2015-07-16 MED ORDER — ENSURE ENLIVE PO LIQD
237.0000 mL | Freq: Two times a day (BID) | ORAL | Status: DC
  Administered 2015-07-16 – 2015-07-17 (×2): 237 mL via ORAL

## 2015-07-16 MED ORDER — METRONIDAZOLE 250 MG PO TABS: 250.0000 mg | ORAL_TABLET | Freq: Three times a day (TID) | ORAL | Status: DC

## 2015-07-16 NOTE — Discharge Summary (Signed)
  [facility] Discharge Summary   Patient ID: [name] [name] MRN: [id number] DOB/AGE: Apr 17, 1961 54 y.o.  Admit date: 07/15/2015 Discharge date: 07/17/2015  Admitting Diagnosis: Proctitis Previous perforated colon, colostomy stricture; s/p LAPAROSCOPIC COLOSTOMY REVERSAL  SPLENIC FLEXURE MOBILIZATION, PELVIC LYSIS OF ADHESIONS 1 HOUR 07/02/15 Dr. Romie Levee, discharged home; 07/07/15.  Tobacco use Hx of constipation Malnutrition Per Nutrition     Discharge Diagnosis Same Patient Active Problem List   Diagnosis Date Noted  . Proctitis 07/15/2015  . 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 S/P colostomy 03/02/2015  . Colon perforation 02/20/2015  . Constipation 12/19/2014  . Colon cancer screening 12/19/2014  . Ingestion of foreign substance 11/06/2014  . Ingestion of substance 11/06/2014  . Neuropathy of foot 12/10/2013  . Skin lesion 11/12/2011  . Corn of toe 10/18/2011  . TOBACCO DEPENDENCE 01/19/2007  . HEARING LOSS NOS OR DEAFNESS 01/19/2007  . MENOPAUSAL SYNDROME 01/19/2007    Consultants None  Imaging: No results found.  Procedures None  Hospital Course:  54 y/o female who is almost 2 weeks s/p colostomy takedown. She has been having some lower abdominal pain and perirectal pain.  Noted bloody stools.  No fever. No nausea.  Workup showed proctitis.  Patient was admitted and was transferred to the floor for observation and medical mangement.  Was started on flagyl PO.  Diet was advanced as tolerated.  We recommended she be discharged on soft/bland food.  On HD #3, the patient was voiding well, tolerating diet, ambulating well, pain well controlled, vital signs stable, and felt stable for discharge home. She was still having rectal pain with BM.  She also noted the more she ate the more the larger the volume of stool and pain.  We stopped her dulcolax Patient will follow up in our office with DR. Thomas next week, and knows to call with questions or concerns.          Medication List    STOP taking these medications        bisacodyl 5 MG EC tablet  Commonly known as:  DULCOLAX     pantoprazole 40 MG tablet  Commonly known as:  PROTONIX      TAKE these medications        feeding supplement (ENSURE ENLIVE) Liqd  You can buy this at any drug store or grocery store.     metroNIDAZOLE 250 MG tablet  Commonly known as:  FLAGYL  Take 1 tablet (250 mg total) by mouth every 8 (eight) hours.     oxyCODONE-acetaminophen 5-325 MG per tablet  Commonly known as:  PERCOCET/ROXICET  Take 1-2 tablets by mouth every 4 (four) hours as needed for moderate pain.       Follow-up Information    Follow up with Vanita Panda., MD In 1 week.   Specialty:  General Surgery   Why:  Our office should call you with an appointment to see Dr. Maisie Fus next week, If you do not have a call by Monday8/29/16 call and get one.   Contact information:   [address] [place] [address] [phone]       Signed: Nonie Hoyer, [facility] [phone]  07/18/2015, 1:57 PM   l

## 2015-07-16 NOTE — Progress Notes (Signed)
Initial Nutrition Assessment  DOCUMENTATION CODES:   Severe malnutrition in context of chronic illness, Underweight  INTERVENTION:   Ensure Enlive po BID, each supplement provides 350 kcal and 20 grams of protein  NUTRITION DIAGNOSIS:   Malnutrition related to chronic illness as evidenced by severe depletion of muscle mass, severe depletion of body fat.  GOAL:   Patient will meet greater than or equal to 90% of their needs  MONITOR:   PO intake, Supplement acceptance, Diet advancement, Labs, Weight trends, Skin, I & O's  REASON FOR ASSESSMENT:   Malnutrition Screening Tool    ASSESSMENT:   The pt is a 54yo female who is almost 2 weeks s/p colostomy takedown. She has been having some lower abdominal pain. No fever. No nausea  Pt admitted for abdominal pain.   Spoke with at bedside. She confirms poor appetite and weight loss x 1 month, secondary to abdominal pain. She reports she has been taking the clear liquids well and is eager to have full liquids at lunch.   Pt reports she has always been petite. She reveals UBW of 80#, which is consistent with wt hx. Noted a 7.5% wt loss over the past month.  Pt reports she enjoyed the Boost Breeze supplements when she was hospitalized at [facility] and is amenable to addition of supplements. She requested Ensure due to increased nutrient density.  Nutrition-Focused physical exam completed. Findings are severe fat depletion, severe muscle depletion, and no edema.   Discussed importance of good meal and supplement intake to promote healing. Pt denied further nutritional concerns at this time, but expressed appreciation for visit.  Labs reviewed.   Diet Order:  Diet full liquid Room service appropriate?: Yes; Fluid consistency:: Thin  Skin:  Reviewed, no issues (closed abdominal incision)  Last BM:  07/15/15  Height:   Ht Readings from Last 1 Encounters:  07/15/15  (1.422 m)    Weight:   Wt Readings from Last 1  Encounters:  07/15/15 74 lb 4.7 oz (33.7 kg)    Ideal Body Weight:  42.3 kg  BMI:  Body mass index is 16.67 kg/(m^2).  Estimated Nutritional Needs:   Kcal:  1200-1400  Protein:  50-60 grams  Fluid:  >1.2 L  EDUCATION NEEDS:   Education needs addressed  Clarene Curran A. Mayford Knife, RD, LDN, CDE Pager: [phone] After hours Pager: [phone]

## 2015-07-16 NOTE — Progress Notes (Signed)
[facility] Progress Note     Subjective: Speaks pretty good [place].  Says she has some pain.  She only had a small BM this am.  No blood in it, was brown.  Her rectum/anus still hurts her.  She has no abdominal pain or N/V.  Tolerating clears well.  Would like fulls.    Objective: Vital signs in last 24 hours: Temp:  [97.9 F (36.6 C)-98.6 F (37 C)] 97.9 F (36.6 C) (08/24 0607) Pulse Rate:  [54-99] 55 (08/24 0607) Resp:  [12-23] 16 (08/24 0607) BP: (100-141)/(57-87) 103/62 mmHg (08/24 0607) SpO2:  [98 %-100 %] 98 % (08/24 0607) Weight:  [32.659 kg (72 lb)-33.7 kg (74 lb 4.7 oz)] 33.7 kg (74 lb 4.7 oz) (08/23 1959) Last BM Date: 07/15/15  Intake/Output from previous day: 08/23 0701 - 08/24 0700 In: 480 [P.O.:480] Out: -  Intake/Output this shift:    PE: Gen:  Alert, NAD, pleasant Abd: Soft, NT/ND, +BS, no HSM,  abdominal scars noted Rectal exam:  Deferred due to pain   Lab Results:   Recent Labs  07/15/15 1110 07/16/15 0356  WBC 6.4 5.1  HGB 10.4* 9.3*  HCT 32.5* 29.0*  PLT 389 341   BMET  Recent Labs  07/15/15 1110 07/16/15 0356  NA 140 139  K 3.3* 3.7  CL 102 104  CO2 28 29  GLUCOSE 89 88  BUN 6 7  CREATININE 0.59 0.66  CALCIUM 9.6 9.0   PT/INR No results for input(s): LABPROT, INR in the last 72 hours. CMP     Component Value Date/Time   NA 139 07/16/2015 0356   K 3.7 07/16/2015 0356   CL 104 07/16/2015 0356   CO2 29 07/16/2015 0356   GLUCOSE 88 07/16/2015 0356   BUN 7 07/16/2015 0356   CREATININE 0.66 07/16/2015 0356   CREATININE 0.57 12/10/2013 1103   CALCIUM 9.0 07/16/2015 0356   PROT 7.0 07/15/2015 1110   ALBUMIN 4.0 07/15/2015 1110   AST 18 07/15/2015 1110   ALT 13* 07/15/2015 1110   ALKPHOS 77 07/15/2015 1110   BILITOT 0.8 07/15/2015 1110   GFRNONAA >60 07/16/2015 0356   GFRAA >60 07/16/2015 0356   Lipase     Component Value Date/Time   LIPASE 19* 07/15/2015 1110       Studies/Results: Ct Abdomen  Pelvis W Contrast  07/15/2015   CLINICAL DATA:  Patient with prior colonoscopy. Multiple episodes of diarrhea. Lower abdominal pain.  EXAM: CT ABDOMEN AND PELVIS WITH CONTRAST  TECHNIQUE: Multidetector CT imaging of the abdomen and pelvis was performed using the standard protocol following bolus administration of intravenous contrast.  CONTRAST:  80 mL Omnipaque 300  COMPARISON:  CT abdomen pelvis 02/26/2015  FINDINGS: Lower chest: Normal heart size. No consolidative or nodular pulmonary opacities. No pleural effusion.  Hepatobiliary: Liver is normal in size and contour. No focal hepatic lesion identified. Gallbladder is decompressed. Suggestion of mild gallbladder wall thickening although motion artifact limits evaluation.  Pancreas: Unremarkable  Spleen: Re- demonstrated coarse calcification within the superior aspect of the spleen.  Adrenals/Urinary Tract: The adrenal glands are normal. Kidneys enhance symmetrically with contrast. No hydronephrosis. Urinary bladder is distended.  Stomach/Bowel: Patient status post reanastomosis with colonic anastomosis demonstrated in the pelvis. There is circumferential wall thickening of the rectum with perirectal fat stranding and small amount of fluid in the pelvis. No evidence for bowel obstruction. No free intraperitoneal air. Re- demonstrated multiple large calcifications involving the mesenteric, particularly within the right lower  quadrant.  Vascular/Lymphatic: Normal caliber abdominal aorta. No retroperitoneal lymphadenopathy.  Other: None  Musculoskeletal: Lower lumbar spine degenerative changes. No aggressive or acute appearing osseous lesions.  IMPRESSION: Anastomotic sutures demonstrated within the distal colon. There is circumferential wall thickening and surrounding fat stranding of the distal rectum distal to the surgical anastomosis concerning for proctocolitis with considerations including infectious, inflammatory or ischemic etiologies. Small amount of the  associated adjacent pelvic free fluid.  There is suggestion of persistent wall thickening of the gallbladder which is limited in evaluation due to motion artifact. Consider correlation with clinical symptomatology, LFTs and right upper quadrant ultrasound as needed.   Electronically Signed   By: Annia Belt M.D.   On: 07/15/2015 15:49    Anti-infectives: Anti-infectives    Start     Dose/Rate Route Frequency Ordered Stop   07/15/15 2100  metroNIDAZOLE (FLAGYL) tablet 500 mg     500 mg Oral 3 times per day 07/15/15 1952         Assessment/Plan POD #12 s/p laparoscopic colostomy reversal, splenic flexure mobilization, pelvic LOA x 1hr (Dr. Maisie Fus) Post-op proctitis -Oral flagyl -Bowel rest, IVF, pain control -WBC normal and afebrile -Hopefully this will resolve soon and she can be discharged home soon      Nonie Hoyer 07/16/2015, 8:17 AM Pager: [phone]

## 2015-07-17 MED ORDER — IOHEXOL 300 MG/ML  SOLN
100.0000 mL | Freq: Once | INTRAMUSCULAR | Status: AC | PRN
  Administered 2015-07-17: 100 mL via INTRAVENOUS

## 2015-07-17 MED ORDER — ENSURE ENLIVE PO LIQD: ORAL | Status: DC

## 2015-07-17 MED ORDER — OXYCODONE-ACETAMINOPHEN 5-325 MG PO TABS
1.0000 | ORAL_TABLET | ORAL | Status: DC | PRN
Start: 2015-07-17 — End: 2016-01-14

## 2015-07-17 NOTE — Progress Notes (Signed)
Subjective: Still complaining of rectal pain with BM's, they are soft and she says the more she eats the more she has pain with BM's.  I had her daughter talk with her; native language [place].  It seems her discomfort is about the same.  Her wounds have healed nicely, she is eating and having soft stools. Taking very little Percocet for pain, no Zofran.  I am going to stop her dulcolax also.  Objective: Vital signs in last 24 hours: Temp:  [98.2 F (36.8 C)-98.6 F (37 C)] 98.2 F (36.8 C) (08/25 0620) Pulse Rate:  [55-71] 55 (08/25 0620) Resp:  [15-20] 20 (08/25 0620) BP: (99-132)/(60-81) 111/60 mmHg (08/25 0620) SpO2:  [100 %] 100 % (08/25 0620) Last BM Date: 07/15/15 820 PO recorded  No BM recorded Afebrile, VSS WBC and BMP are normal Intake/Output from previous day: 08/24 0701 - 08/25 0700 In: 820 [P.O.:820] Out: -  Intake/Output this shift:    General appearance: alert, cooperative and no distress GI: soft, non-tender; bowel sounds normal; no masses,  no organomegaly and incisions all healing nicely.  Only complaint is rectal pain with BM.  Lab Results:   Recent Labs  07/15/15 1110 07/16/15 0356  WBC 6.4 5.1  HGB 10.4* 9.3*  HCT 32.5* 29.0*  PLT 389 341    BMET  Recent Labs  07/15/15 1110 07/16/15 0356  NA 140 139  K 3.3* 3.7  CL 102 104  CO2 28 29  GLUCOSE 89 88  BUN 6 7  CREATININE 0.59 0.66  CALCIUM 9.6 9.0   PT/INR No results for input(s): LABPROT, INR in the last 72 hours.   Recent Labs Lab 07/15/15 1110  AST 18  ALT 13*  ALKPHOS 77  BILITOT 0.8  PROT 7.0  ALBUMIN 4.0     Lipase     Component Value Date/Time   LIPASE 19* 07/15/2015 1110     Studies/Results: Ct Abdomen Pelvis W Contrast  07/15/2015   CLINICAL DATA:  Patient with prior colonoscopy. Multiple episodes of diarrhea. Lower abdominal pain.  EXAM: CT ABDOMEN AND PELVIS WITH CONTRAST  TECHNIQUE: Multidetector CT imaging of the abdomen and pelvis was performed using  the standard protocol following bolus administration of intravenous contrast.  CONTRAST:  80 mL Omnipaque 300  COMPARISON:  CT abdomen pelvis 02/26/2015  FINDINGS: Lower chest: Normal heart size. No consolidative or nodular pulmonary opacities. No pleural effusion.  Hepatobiliary: Liver is normal in size and contour. No focal hepatic lesion identified. Gallbladder is decompressed. Suggestion of mild gallbladder wall thickening although motion artifact limits evaluation.  Pancreas: Unremarkable  Spleen: Re- demonstrated coarse calcification within the superior aspect of the spleen.  Adrenals/Urinary Tract: The adrenal glands are normal. Kidneys enhance symmetrically with contrast. No hydronephrosis. Urinary bladder is distended.  Stomach/Bowel: Patient status post reanastomosis with colonic anastomosis demonstrated in the pelvis. There is circumferential wall thickening of the rectum with perirectal fat stranding and small amount of fluid in the pelvis. No evidence for bowel obstruction. No free intraperitoneal air. Re- demonstrated multiple large calcifications involving the mesenteric, particularly within the right lower quadrant.  Vascular/Lymphatic: Normal caliber abdominal aorta. No retroperitoneal lymphadenopathy.  Other: None  Musculoskeletal: Lower lumbar spine degenerative changes. No aggressive or acute appearing osseous lesions.  IMPRESSION: Anastomotic sutures demonstrated within the distal colon. There is circumferential wall thickening and surrounding fat stranding of the distal rectum distal to the surgical anastomosis concerning for proctocolitis with considerations including infectious, inflammatory or ischemic etiologies. Small amount of the  associated adjacent pelvic free fluid.  There is suggestion of persistent wall thickening of the gallbladder which is limited in evaluation due to motion artifact. Consider correlation with clinical symptomatology, LFTs and right upper quadrant ultrasound as  needed.   Electronically Signed   By: Annia Belt M.D.   On: 07/15/2015 15:49    Medications: . feeding supplement (ENSURE ENLIVE)  237 mL Oral BID BM  . heparin  5,000 Units Subcutaneous 3 times per day  . metroNIDAZOLE  500 mg Oral 3 times per day  . pantoprazole  40 mg Oral Daily    Assessment/Plan Previous perforated colon, colostomy stricture; s/p LAPAROSCOPIC COLOSTOMY REVERSAL  SPLENIC FLEXURE MOBILIZATION, PELVIC LYSIS OF ADHESIONS 1 HOUR  07/02/15 Dr. Romie Levee, discharged 07/07/15 Readmitted 07/15/15 with lower abdominal pain, no nausea or fever; post op proctitis Tobacco use Hx of constipation Malnutrition Per Nutrition Antibiotics: Day 3 of Flagyl DVT:  Heparin Plan:  Discussed with Dr. Carolynne Edouard, we will let her go home on Flagyl for 5 more days.  Our office is working to set her up with follow up visit next week with Dr. Maisie Fus. Stop the dulcolax and let her try some sitz baths also at home.          Natasa Stigall 07/17/2015

## 2015-07-17 NOTE — Discharge Planning (Signed)
Patient discharged home in stable condition. Daughter verbalizes understanding of all discharge instructions, including home medications and follow up appointments. 

## 2015-07-17 NOTE — Discharge Instructions (Signed)
Proctitis Proctitis is the swelling and soreness (inflammation) of the lining of the rectum. The rectum is at the end of the large intestine and is attached to the anus. The inflammation causes pain and discomfort. It may be short-term (acute) or long-lasting (chronic). CAUSES Inflammation in the rectum can be caused by many things, such as: 1. Sexually transmitted diseases (STDs). 2. Infection. 3. Anal-rectal trauma or injury. 4. Ulcerative colitis or Crohn's disease. 5. Radiation therapy directed near the rectum. 6. Antibiotic therapy. SYMPTOMS  Sudden, uncomfortable, and frequent urge to have a bowel movement.  Anal or rectal pain.  Abdominal cramping or pain.  Sensation that the rectum is full.  Rectal bleeding.  Pus or mucus discharge from anus.  Diarrhea or frequent soft, loose stools. DIAGNOSIS Diagnosis may include the following:  A history and physical exam.  An STD test.  Blood tests.  Stool tests.  Rectal culture.  A procedure to evaluate the anal canal (anoscopy).  Procedures to look at part, or the entire large bowel (sigmoidoscopy, colonoscopy). TREATMENT Treatment of proctitis depends on the cause. Reducing the symptoms of inflammation and eliminating infection are the main goals of treatment. Treatment may include:  Home remedies and lifestyle, such as sitz baths and avoiding food right before bedtime.  Topical ointments, foams, suppositories, or enemas, such as corticosteroids or anti-inflammatories.  Antibiotic or antiviral medicines to treat infection or to control harmful bacteria.  Medicines to control diarrhea, soften stools, and reduce pain.  Medicines to suppress the immune system.  Avoiding the activity that caused rectal trauma.  Nutritional, dietary, or herbal supplements.  Heat or laser therapy for persistent bleeding.  A dilation procedure to enlarge a narrowed rectum.  Surgery, though rare, may be necessary to repair damaged  rectal lining. HOME CARE INSTRUCTIONS Only take medicines that are recommended or approved by your caregiver.Do not take anti-diarrhea medicine without your caregiver's approval. SEEK MEDICAL CARE IF:  You often experience one or more of the symptoms noted above.  You keep experiencing symptoms after treatment.  You have questions or concerns about your symptoms or treatment plan. MAKE SURE YOU:  Understand these instructions.  Will watch your condition.  Will get help right away if you are not doing well or get worse. FOR MORE INFORMATION National Institute of Diabetes and Digestive and Kidney Disease (NIDDK): www.digestive.https://bradley.com/ Document Released: 10/28/2011 Document Revised: 03/05/2013 Document Reviewed: 10/28/2011 [facility] Patient Information 2015 [place], [place]. This information is not intended to replace advice given to you by your health care provider. Make sure you discuss any questions you have with your health care provider.  Sitz Bath A sitz bath is a warm water bath taken in the sitting position. The water covers only the hips and butt (buttocks). It may be used for either healing or cleaning purposes. Sitz baths are also used to relieve pain, itching, or muscle tightening (spasms). The water may contain medicine. Moist heat will help you heal and relax.  HOME CARE  Take 3 to 4 sitz baths a day. 7. Fill the bathtub half-full with warm water. 8. Sit in the water and open the drain a little. 9. Turn on the warm water to keep the tub half-full. Keep the water running constantly. 10. Soak in the water for 15 to 20 minutes. 11. After the sitz bath, pat the affected area dry. GET HELP RIGHT AWAY IF: You get worse instead of better. Stop the sitz baths if you get worse. MAKE SURE YOU:  Understand these instructions.  Will watch your condition.  Will get help right away if you are not doing well or get worse. Document Released: 12/16/2004 Document Revised:  08/02/2012 Document Reviewed: 03/08/2011 [facility] Patient Information 2015 [place], [place]. This information is not intended to replace advice given to you by your health care provider. Make sure you discuss any questions you have with your health care provider.  [facility]      [facility], [place] [phone]  OPEN ABDOMINAL SURGERY: POST OP INSTRUCTIONS  Always review your discharge instruction sheet given to you by the facility where your surgery was performed.  IF YOU HAVE DISABILITY OR FAMILY LEAVE FORMS, YOU MUST BRING THEM TO THE OFFICE FOR PROCESSING.  PLEASE DO NOT GIVE THEM TO YOUR DOCTOR.  1. A prescription for pain medication may be given to you upon discharge.  Take your pain medication as prescribed, if needed.  If narcotic pain medicine is not needed, then you may take acetaminophen (Tylenol) or ibuprofen (Advil) as needed. 2. Take your usually prescribed medications unless otherwise directed. 3. If you need a refill on your pain medication, please contact your pharmacy. They will contact our office to request authorization.  Prescriptions will not be filled after 5pm or on week-ends. 4. You should follow a light diet the first few days after arrival home, such as soup and crackers, pudding, etc.unless your doctor has advised otherwise. A high-fiber, low fat diet can be resumed as tolerated.   Be sure to include lots of fluids daily. Most patients will experience some swelling and bruising on the chest and neck area.  Ice packs will help.  Swelling and bruising can take several days to resolve 5. Most patients will experience some swelling and bruising in the area of the incision. Ice pack will help. Swelling and bruising can take several days to resolve..  6. It is common to experience some constipation if taking pain medication after surgery.  Increasing fluid intake and taking a stool softener will usually help or prevent this problem from occurring.  A mild laxative (Milk of  Magnesia or Miralax) should be taken according to package directions if there are no bowel movements after 48 hours. 7.  You may have steri-strips (small skin tapes) in place directly over the incision.  These strips should be left on the skin for 7-10 days.  If your surgeon used skin glue on the incision, you may shower in 24 hours.  The glue will flake off over the next 2-3 weeks.  Any sutures or staples will be removed at the office during your follow-up visit. You may find that a light gauze bandage over your incision may keep your staples from being rubbed or pulled. You may shower and replace the bandage daily. 8. ACTIVITIES:  You may resume regular (light) daily activities beginning the next day--such as daily self-care, walking, climbing stairs--gradually increasing activities as tolerated.  You may have sexual intercourse when it is comfortable.  Refrain from any heavy lifting or straining until approved by your doctor. a. You may drive when you no longer are taking prescription pain medication, you can comfortably wear a seatbelt, and you can safely maneuver your car and apply brakes b. Return to Work: ___________________________________ 9. You should see your doctor in the office for a follow-up appointment approximately two weeks after your surgery.  Make sure that you call for this appointment within a day or two after you arrive home to insure a convenient appointment time. OTHER INSTRUCTIONS:  _____________________________________________________________ _____________________________________________________________  WHEN TO CALL YOUR DOCTOR: 1. Fever over 101.0 2. Inability to urinate 3. Nausea and/or vomiting 4. Extreme swelling or bruising 5. Continued bleeding from incision. 6. Increased pain, redness, or drainage from the incision. 7. Difficulty swallowing or breathing 8. Muscle cramping or spasms. 9. Numbness or tingling in hands or feet or around lips.  The clinic staff is  available to answer your questions during regular business hours.  Please dont hesitate to call and ask to speak to one of the nurses if you have concerns.  For further questions, please visit [email or web address]      [facility], [place] [phone]  OPEN ABDOMINAL SURGERY: POST OP INSTRUCTIONS  Always review your discharge instruction sheet given to you by the facility where your surgery was performed.  IF YOU HAVE DISABILITY OR FAMILY LEAVE FORMS, YOU MUST BRING THEM TO THE OFFICE FOR PROCESSING.  PLEASE DO NOT GIVE THEM TO YOUR DOCTOR.  10. A prescription for pain medication may be given to you upon discharge.  Take your pain medication as prescribed, if needed.  If narcotic pain medicine is not needed, then you may take acetaminophen (Tylenol) or ibuprofen (Advil) as needed. 11. Take your usually prescribed medications unless otherwise directed. 12. If you need a refill on your pain medication, please contact your pharmacy. They will contact our office to request authorization.  Prescriptions will not be filled after 5pm or on week-ends. 13. You should follow a light diet the first few days after arrival home, such as soup and crackers, pudding, etc.unless your doctor has advised otherwise. A high-fiber, low fat diet can be resumed as tolerated.   Be sure to include lots of fluids daily. Most patients will experience some swelling and bruising on the chest and neck area.  Ice packs will help.  Swelling and bruising can take several days to resolve 14. Most patients will experience some swelling and bruising in the area of the incision. Ice pack will help. Swelling and bruising can take several days to resolve..  15. It is common to experience some constipation if taking pain medication after surgery.  Increasing fluid intake and taking a stool softener will usually help or prevent this problem from occurring.  A mild laxative (Milk of Magnesia or Miralax) should be  taken according to package directions if there are no bowel movements after 48 hours. 16.  You may have steri-strips (small skin tapes) in place directly over the incision.  These strips should be left on the skin for 7-10 days.  If your surgeon used skin glue on the incision, you may shower in 24 hours.  The glue will flake off over the next 2-3 weeks.  Any sutures or staples will be removed at the office during your follow-up visit. You may find that a light gauze bandage over your incision may keep your staples from being rubbed or pulled. You may shower and replace the bandage daily. 17. ACTIVITIES:  You may resume regular (light) daily activities beginning the next day--such as daily self-care, walking, climbing stairs--gradually increasing activities as tolerated.  You may have sexual intercourse when it is comfortable.  Refrain from any heavy lifting or straining until approved by your doctor. a. You may drive when you no longer are taking prescription pain medication, you can comfortably wear a seatbelt, and you can safely maneuver your car and apply brakes b. Return to Work: ___________________________________ 18. You should see your doctor in the office for a follow-up appointment approximately two  weeks after your surgery.  Make sure that you call for this appointment within a day or two after you arrive home to insure a convenient appointment time. OTHER INSTRUCTIONS:  _____________________________________________________________ _____________________________________________________________  WHEN TO CALL YOUR DOCTOR: 10. Fever over 101.0 11. Inability to urinate 12. Nausea and/or vomiting 13. Extreme swelling or bruising 14. Continued bleeding from incision. 15. Increased pain, redness, or drainage from the incision. 16. Difficulty swallowing or breathing 17. Muscle cramping or spasms. 18. Numbness or tingling in hands or feet or around lips.  The clinic staff is available to answer  your questions during regular business hours.  Please dont hesitate to call and ask to speak to one of the nurses if you have concerns.  For further questions, please visit [email or web address]

## 2016-01-07 ENCOUNTER — Ambulatory Visit: Payer: 59 | Admitting: Family Medicine

## 2016-01-14 ENCOUNTER — Ambulatory Visit (INDEPENDENT_AMBULATORY_CARE_PROVIDER_SITE_OTHER): Payer: 59 | Admitting: Family Medicine

## 2016-01-14 VITALS — BP 121/78 | HR 81 | Temp 98.2°F | Wt 75.4 lb

## 2016-01-14 DIAGNOSIS — R1032 Left lower quadrant pain: Secondary | ICD-10-CM

## 2016-01-14 DIAGNOSIS — R109 Unspecified abdominal pain: Secondary | ICD-10-CM | POA: Diagnosis not present

## 2016-01-14 DIAGNOSIS — R1012 Left upper quadrant pain: Secondary | ICD-10-CM | POA: Diagnosis not present

## 2016-01-14 MED ORDER — DOCUSATE SODIUM 100 MG PO CAPS: 100.0000 mg | ORAL_CAPSULE | Freq: Two times a day (BID) | ORAL | Status: DC

## 2016-01-14 MED ORDER — ACETAMINOPHEN 500 MG PO TABS: 500.0000 mg | ORAL_TABLET | Freq: Four times a day (QID) | ORAL | Status: DC | PRN

## 2016-01-14 MED ORDER — IBUPROFEN 600 MG PO TABS
600.0000 mg | ORAL_TABLET | Freq: Three times a day (TID) | ORAL | Status: DC | PRN
Start: 2016-01-14 — End: 2017-06-21

## 2016-01-14 NOTE — Patient Instructions (Addendum)
STOOL SOFTENER Colace - take 1 capsule twice a day  PAIN Tylenol  or  - take every 6 hours as needed Ibuprofen  - take every 6-8 hours as needed  Can alternate the tylenol and ibuprofen, don't take at the same time. If one works better than the other then you can continue just using that one  Return in about 1-2 months, if not improving we may refer you to the surgeons office.

## 2016-01-14 NOTE — Progress Notes (Signed)
   Subjective:    Patient ID: [name] [name], female    DOB: [birth year], 55 y.o.   MRN: [id number]  HPI  Patient speaks limited English, at patient's preference her daughter helped interpret. Patient is a Producer, television/film/video.  CC: abdominal pain  # Abdominal pain:  Left side, upper and lower quadrants.  Pain is constant, has been present since her colostomy reversal surgery in August 2016  Pain is worse with movement, worse after eating a big meal (says she cannot leave forward at all)  Feels constipated, stools every 2 days or so, they are both hard sometimes normal. No blood but does notice some dark/black stools at times  No diarrhea  Has tried miralax, she felt that this made her pain worse. It would cause her to have loose BMs and maybe feel some very temporary relief after the BMs.   Has not tried tylenol or ibuprofen ROS: no fevers, no vomiting  Social Hx: current smoker  Review of Systems   See HPI for ROS.   Past medical history, surgical, family, and social history reviewed and updated in the EMR as appropriate. Objective:  BP 121/78 mmHg  Pulse 81  Temp(Src) 98.2 F (36.8 C) (Oral)  Wt 75 lb 6.4 oz (34.201 kg) Vitals and nursing note reviewed  General: no apparent distress  CV: normal rate, regular rhythm, no murmurs Resp: clear to auscultation bilaterally, normal effort Abdomen: multiple surgical scars, midline, old ostomy. Soft, tender primarily left side particularly around her scars.  Assessment & Plan:  Abdominal pain Left sided in setting of multiple abdominal surgeries (perforated colon, colostomy, colostomy reversal). I have a strong suspicion she is getting pain related to adhesions/scarring that is worsened with constipation. She did not seem to tolerate miralax. We will do a trial of colace, OTC analgesics, and have her follow up with me in about 1-2 months to evaluate response. If not improving feel a follow up with her surgeon for discussion of possible  options would be needed (though I am unsure of exactly what interventions could be done without leading to potentially more problems).

## 2016-01-15 DIAGNOSIS — R109 Unspecified abdominal pain: Secondary | ICD-10-CM | POA: Insufficient documentation

## 2016-01-15 NOTE — Assessment & Plan Note (Signed)
Left sided in setting of multiple abdominal surgeries (perforated colon, colostomy, colostomy reversal). I have a strong suspicion she is getting pain related to adhesions/scarring that is worsened with constipation. She did not seem to tolerate miralax. We will do a trial of colace, OTC analgesics, and have her follow up with me in about 1-2 months to evaluate response. If not improving feel a follow up with her surgeon for discussion of possible options would be needed (though I am unsure of exactly what interventions could be done without leading to potentially more problems).

## 2016-04-26 ENCOUNTER — Encounter: Payer: Self-pay | Admitting: Family Medicine

## 2016-04-26 ENCOUNTER — Ambulatory Visit (INDEPENDENT_AMBULATORY_CARE_PROVIDER_SITE_OTHER): Payer: 59 | Admitting: Family Medicine

## 2016-04-26 VITALS — BP 147/83 | HR 80 | Temp 98.3°F | Ht <= 58 in | Wt 76.0 lb

## 2016-04-26 DIAGNOSIS — N951 Menopausal and female climacteric states: Secondary | ICD-10-CM | POA: Diagnosis not present

## 2016-04-26 DIAGNOSIS — G4700 Insomnia, unspecified: Secondary | ICD-10-CM

## 2016-04-26 MED ORDER — MIRTAZAPINE 7.5 MG PO TABS: 7.5000 mg | ORAL_TABLET | Freq: Every day | ORAL | Status: DC

## 2016-04-26 NOTE — Patient Instructions (Signed)
Work on the sleep hygiene we discussed:  No blue lights or TV watching 2 hours before going to bed No caffeine, no smoking cigarettes 6 hours before going to bed Try putting blackout shades in your bedroom so that you sleep for at least 8 hours  Schedule an appointment for 1-2 weeks to get your physical.

## 2016-04-26 NOTE — Progress Notes (Signed)
   Subjective:    Patient ID: [name] [name], female    DOB: 03-14-1961, 55 y.o.   MRN: [id number]  HPI    CC: memory concerns  # Memory:  Patient has memory concern.   Gets feeling of very warm forehead and has an issue with   Several weeks ago she describes an episode where she bent over while at work and when she stood up felt confused like she did not know where she was. This seemed to last about an hour.  Burning of head can last up to 2 hours. Feels some dull pain with this. Says this has been going on since age 55, however seems to be getting worse recently. This comes and goes suddenly. In the past month this has happened 2 times, 3-4 times in April. She doesn't notice any other symptoms like weakness, changes in vision. Light does not bother her during these episodes but loud noises do bother her.  She reports having her "womb" taken out around 1994 -- she says this was similar to the first  ROS: denies changes in vision, weakness, SOB, CP  # Insomnia  Works 2nd shift at [employer]  Often goes to sleep around 2am and wakes up around [employer]6-7am  Social Hx: current smoker  Review of Systems   See HPI for ROS.   Past medical history, surgical, family, and social history reviewed and updated in the EMR as appropriate. Objective:  BP 147/83 mmHg  Pulse 80  Temp(Src) 98.3 F (36.8 C) (Oral)  Ht 4\' 6"  (1.372 m)  Wt 76 lb (34.473 kg)  BMI 18.31 kg/m2 Vitals and nursing note reviewed  General: no apparent distress  Eyes: PERRL, EOMI, normal conjunctiva and sclera  ENTM: normal appearing oropharynx Neck: supple, FROM CV: normal rate, regular rhythm, no murmurs, rubs or gallop  Resp: clear to auscultation bilaterally, normal effort Neuro: alert and oriented, CN2-12 normal, strength 5/5 UE and LE bilaterally. Normal finger to nose testing. Normal gait.  Assessment & Plan:  MENOPAUSAL SYNDROME Patient and family concerned about stroke/TIA, however what she describes seems to be more  consistent with menopausal symptoms (which seems to have been experiencing since a reported hysterectomy in her 2340s). She has a normal neuro exam. Memory loss, which seems to be more related to this since it is not long term and more short term and eventually remembers. Discussed return precautions for stroke like symptoms.   Insomnia Does describe sleep issues, she works 2nd shift. This could be contributign to some of her memory issues. She would like to try remeron.    Return in about 2 weeks (around 05/10/2016).

## 2016-04-29 DIAGNOSIS — G4700 Insomnia, unspecified: Secondary | ICD-10-CM | POA: Insufficient documentation

## 2016-04-29 NOTE — Assessment & Plan Note (Signed)
Patient and family concerned about stroke/TIA, however what she describes seems to be more consistent with menopausal symptoms (which seems to have been experiencing since a reported hysterectomy in her 3140s). She has a normal neuro exam. Memory loss, which seems to be more related to this since it is not long term and more short term and eventually remembers. Discussed return precautions for stroke like symptoms.

## 2016-04-29 NOTE — Assessment & Plan Note (Signed)
Does describe sleep issues, she works 2nd shift. This could be contributign to some of her memory issues. She would like to try remeron.

## 2016-05-21 ENCOUNTER — Ambulatory Visit: Payer: 59 | Admitting: Family Medicine

## 2016-06-21 ENCOUNTER — Encounter: Payer: Self-pay | Admitting: Obstetrics and Gynecology

## 2016-06-21 ENCOUNTER — Ambulatory Visit (INDEPENDENT_AMBULATORY_CARE_PROVIDER_SITE_OTHER): Payer: 59 | Admitting: Obstetrics and Gynecology

## 2016-06-21 VITALS — BP 122/86 | HR 69 | Wt 75.0 lb

## 2016-06-21 DIAGNOSIS — R109 Unspecified abdominal pain: Secondary | ICD-10-CM | POA: Diagnosis not present

## 2016-06-21 DIAGNOSIS — G4700 Insomnia, unspecified: Secondary | ICD-10-CM | POA: Diagnosis not present

## 2016-06-21 DIAGNOSIS — R413 Other amnesia: Secondary | ICD-10-CM

## 2016-06-21 DIAGNOSIS — K219 Gastro-esophageal reflux disease without esophagitis: Secondary | ICD-10-CM

## 2016-06-21 MED ORDER — OMEPRAZOLE 20 MG PO CPDR: 20.0000 mg | DELAYED_RELEASE_CAPSULE | Freq: Every day | ORAL | Status: DC

## 2016-06-21 NOTE — Progress Notes (Signed)
     Subjective: Chief Complaint  Patient presents with  . Follow-up    insomnia     HPI: [name] [name] is a 55 y.o. presenting to clinic today to discuss the following:  #Insomnia: Was given remeron at last visit  Sleeping is better so stopped taking medication Doesn't want to take any additional medications at this time for sleep Stated medication made her feel funny. She got headaches and felt dizzy.  It was also hard for her to wake up in the AM  #Memory: Continues to forget things.  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 ABDOMINAL HYSTERECTOMY  1994  . APPENDECTOMY  1993  . CESAREAN SECTION  1992  . COLONOSCOPY N/A 05/30/2015   Procedure: COLONOSCOPY;  Surgeon: Romie Levee, MD;  Location: [facility];  Service: Endoscopy;  Laterality: N/A;  . COLONOSCOPY WITH PROPOFOL N/A 06/12/2015   Procedure: COLONOSCOPY WITH PROPOFOL;  Surgeon: Romie Levee, MD;  Location: [facility];  Service: Endoscopy;  Laterality: N/A;  . COLOSTOMY  01/2015  . COLOSTOMY TAKEDOWN N/A 07/02/2015   Procedure: LAPAROSCOPIC COLOSTOMY REVERSAL SPLENIC FLEXURE MOBILIZATION AND PARTIAL COLECTOMY;  Surgeon: Romie Levee, MD;  Location: [facility];  Service: General;  Laterality: N/A;  . DILATION AND CURETTAGE OF UTERUS  1990   "miscarriage"  . LAPAROTOMY N/A 02/20/2015   Procedure: PARTIAL COLECTOMY END  ILEOSTOMY AND HARTMAN'S PROCEDURE;  Surgeon: Romie Levee, MD;  Location: [facility];  Service: General;  Laterality: N/A;    Health Maintenance: hep c, hiv, mammo, pap, tdap    ROS noted in HPI.  Past Medical, Surgical, Social, and Family History Reviewed & Updated per EMR. Smoking status - Current smoker   Objective: BP 122/86   Pulse 69   Wt 75 lb (34 kg)   BMI 18.08 kg/m  Vitals and nursing notes reviewed  Physical Exam  Constitutional: She is oriented to person, place, and time and well-developed, well-nourished, and in no distress.  Cardiovascular: Normal rate.   Pulmonary/Chest: Effort normal.  Abdominal: Soft. Bowel sounds are normal. She exhibits no distension and no mass. There is no hepatosplenomegaly. There is tenderness in the periumbilical area and left lower quadrant. There is no guarding.  Several surgical scars noted on abdomen that are well healed. Tenderness to palpation of scars. Fibrous tissue felt beneath scars  Neurological: She is alert and oriented to person, place, and time. She has normal motor skills and normal strength. No cranial nerve deficit.  Psychiatric: Mood, memory and affect normal.    Assessment/Plan: Please see problem based Assessment and Plan  Health Maintainance: Will schedule a cpe.   Meds ordered this encounter  Medications  . omeprazole (PRILOSEC) capsule 20 mg     Caryl Ada, DO 06/21/2016, 8:40 AM PGY-3, [facility]

## 2016-06-21 NOTE — Patient Instructions (Addendum)
   Schedule a follow-up for physical/wellness exam whenever you are free  PPI prescribed for abdominal pain   Memory Compensation Strategies  1. Use "WARM" strategy.  W= write it down  A= associate it  R= repeat it  M= make a mental note  2.   You can keep a Glass blower/designer.  Use a 3-ring notebook with sections for the following: calendar, important names and phone numbers,  medications, doctors' names/phone numbers, lists/reminders, and a section to journal what you did  each day.   3.    Use a calendar to write appointments down.  4.    Write yourself a schedule for the day.  This can be placed on the calendar or in a separate section of the Memory Notebook.  Keeping a  regular schedule can help memory.  5.    Use medication organizer with sections for each day or morning/evening pills.  You may need help loading it  6.    Keep a basket, or pegboard by the door.  Place items that you need to take out with you in the basket or on the pegboard.  You may also want to  include a message board for reminders.  7.    Use sticky notes.  Place sticky notes with reminders in a place where the task is performed.  For example: " turn off the  stove" placed by the stove, "lock the door" placed on the door at eye level, " take your medications" on  the bathroom mirror or by the place where you normally take your medications.  8.    Use alarms/timers.  Use while cooking to remind yourself to check on food or as a reminder to take your medicine, or as a  reminder to make a call, or as a reminder to perform another task, etc.

## 2016-06-23 ENCOUNTER — Telehealth: Payer: Self-pay | Admitting: *Deleted

## 2016-06-23 ENCOUNTER — Encounter: Payer: Self-pay | Admitting: Obstetrics and Gynecology

## 2016-06-23 DIAGNOSIS — R413 Other amnesia: Secondary | ICD-10-CM | POA: Insufficient documentation

## 2016-06-23 DIAGNOSIS — K219 Gastro-esophageal reflux disease without esophagitis: Secondary | ICD-10-CM | POA: Insufficient documentation

## 2016-06-23 MED ORDER — OMEPRAZOLE 20 MG PO CPDR: 20.0000 mg | DELAYED_RELEASE_CAPSULE | Freq: Every day | ORAL | 3 refills | Status: DC

## 2016-06-23 NOTE — Assessment & Plan Note (Addendum)
Normal neuro exam. Does not appear to have actual memory loss but more short term memory difficulties (eventually remembers). Unable to do MMSE today in setting of time restraint and language barrier. Will obtain at next visit. Reassurance given to patient. Handout provided with memory compensation tips. Discussed return precautions.

## 2016-06-23 NOTE — Assessment & Plan Note (Signed)
Continues to have left-sided abdominal pain. Etiology appears to be due to scar tissue and adhesions from multiple abdominal surgeries. Also endorses constipation which she takes colace for. Believe with the burning sensation and the tenderness in her epigastric region that she has a component of reflux. Conservative treatment at this time. Reassured patient. Return precautions discussed. Can consider referral to surgeon for possible treatment of adhesions if abdominal pain not relieved or worsens. Also would consider abdominal imaging at that time.

## 2016-06-23 NOTE — Assessment & Plan Note (Signed)
Rx for trial of PPI given.

## 2016-06-23 NOTE — Assessment & Plan Note (Signed)
Improved. Discontinue Remeron in setting of side effects. Patient does not want any additional therapy at this time. Will continue to monitor.

## 2016-06-23 NOTE — Telephone Encounter (Signed)
Patient in clinic stating the pharmacy did not receive rx for prilosec.  Medication was ordered as clinic administered.  Medication reordered and sent in to [facility] pharmacy per patient request.  Clovis Pu, RN

## 2016-07-13 ENCOUNTER — Ambulatory Visit: Payer: 59 | Admitting: Obstetrics and Gynecology

## 2016-07-27 ENCOUNTER — Encounter: Payer: Self-pay | Admitting: Obstetrics and Gynecology

## 2016-07-27 ENCOUNTER — Ambulatory Visit (INDEPENDENT_AMBULATORY_CARE_PROVIDER_SITE_OTHER): Payer: 59 | Admitting: Obstetrics and Gynecology

## 2016-07-27 VITALS — BP 117/74 | HR 74 | Temp 98.4°F | Wt 76.0 lb

## 2016-07-27 DIAGNOSIS — Z114 Encounter for screening for human immunodeficiency virus [HIV]: Secondary | ICD-10-CM

## 2016-07-27 DIAGNOSIS — Z0000 Encounter for general adult medical examination without abnormal findings: Secondary | ICD-10-CM | POA: Diagnosis not present

## 2016-07-27 DIAGNOSIS — Z23 Encounter for immunization: Secondary | ICD-10-CM | POA: Diagnosis not present

## 2016-07-27 DIAGNOSIS — D649 Anemia, unspecified: Secondary | ICD-10-CM | POA: Diagnosis not present

## 2016-07-27 HISTORY — DX: Anemia, unspecified: D64.9

## 2016-07-27 LAB — CBC
HEMATOCRIT: 38.4 % (ref 35.0–45.0)
HEMOGLOBIN: 11.9 g/dL (ref 11.7–15.5)
MCH: 22.5 pg — AB (ref 27.0–33.0)
MCHC: 31 g/dL — AB (ref 32.0–36.0)
MCV: 72.6 fL — AB (ref 80.0–100.0)
Platelets: 200 10*3/uL (ref 140–400)
RBC: 5.29 MIL/uL — AB (ref 3.80–5.10)
RDW: 16.3 % — ABNORMAL HIGH (ref 11.0–15.0)
WBC: 7.9 10*3/uL (ref 3.8–10.8)

## 2016-07-27 NOTE — Patient Instructions (Signed)
Will contact you about lab work Call imaging center to schedule mammogram  Health Maintenance, Female Adopting a healthy lifestyle and getting preventive care can go a long way to promote health and wellness. Talk with your health care provider about what schedule of regular examinations is right for you. This is a good chance for you to check in with your provider about disease prevention and staying healthy. In between checkups, there are plenty of things you can do on your own. Experts have done a lot of research about which lifestyle changes and preventive measures are most likely to keep you healthy. Ask your health care provider for more information. WEIGHT AND DIET  Eat a healthy diet  Be sure to include plenty of vegetables, fruits, low-fat dairy products, and lean protein.  Do not eat a lot of foods high in solid fats, added sugars, or salt.  Get regular exercise. This is one of the most important things you can do for your health.  Most adults should exercise for at least 150 minutes each week. The exercise should increase your heart rate and make you sweat (moderate-intensity exercise).  Most adults should also do strengthening exercises at least twice a week. This is in addition to the moderate-intensity exercise.  Maintain a healthy weight  Body mass index (BMI) is a measurement that can be used to identify possible weight problems. It estimates body fat based on height and weight. Your health care provider can help determine your BMI and help you achieve or maintain a healthy weight.  For females 68 years of age and older:   A BMI below 18.5 is considered underweight.  A BMI of 18.5 to 24.9 is normal.  A BMI of 25 to 29.9 is considered overweight.  A BMI of 30 and above is considered obese.  Watch levels of cholesterol and blood lipids  You should start having your blood tested for lipids and cholesterol at 54 years of age, then have this test every 5 years.  You  may need to have your cholesterol levels checked more often if:  Your lipid or cholesterol levels are high.  You are older than 54 years of age.  You are at high risk for heart disease.  CANCER SCREENING   Lung Cancer  Lung cancer screening is recommended for adults 32-77 years old who are at high risk for lung cancer because of a history of smoking.  A yearly low-dose CT scan of the lungs is recommended for people who:  Currently smoke.  Have quit within the past 15 years.  Have at least a 30-pack-year history of smoking. A pack year is smoking an average of one pack of cigarettes a day for 1 year.  Yearly screening should continue until it has been 15 years since you quit.  Yearly screening should stop if you develop a health problem that would prevent you from having lung cancer treatment.  Breast Cancer  Practice breast self-awareness. This means understanding how your breasts normally appear and feel.  It also means doing regular breast self-exams. Let your health care provider know about any changes, no matter how small.  If you are in your 20s or 30s, you should have a clinical breast exam (CBE) by a health care provider every 1-3 years as part of a regular health exam.  If you are 67 or older, have a CBE every year. Also consider having a breast X-ray (mammogram) every year.  If you have a family history of breast  cancer, talk to your health care provider about genetic screening.  If you are at high risk for breast cancer, talk to your health care provider about having an MRI and a mammogram every year.  Breast cancer gene (BRCA) assessment is recommended for women who have family members with BRCA-related cancers. BRCA-related cancers include:  Breast.  Ovarian.  Tubal.  Peritoneal cancers.  Results of the assessment will determine the need for genetic counseling and BRCA1 and BRCA2 testing. Cervical Cancer Your health care provider may recommend that you  be screened regularly for cancer of the pelvic organs (ovaries, uterus, and vagina). This screening involves a pelvic examination, including checking for microscopic changes to the surface of your cervix (Pap test). You may be encouraged to have this screening done every 3 years, beginning at age 45.  For women ages 50-65, health care providers may recommend pelvic exams and Pap testing every 3 years, or they may recommend the Pap and pelvic exam, combined with testing for human papilloma virus (HPV), every 5 years. Some types of HPV increase your risk of cervical cancer. Testing for HPV may also be done on women of any age with unclear Pap test results.  Other health care providers may not recommend any screening for nonpregnant women who are considered low risk for pelvic cancer and who do not have symptoms. Ask your health care provider if a screening pelvic exam is right for you.  If you have had past treatment for cervical cancer or a condition that could lead to cancer, you need Pap tests and screening for cancer for at least 20 years after your treatment. If Pap tests have been discontinued, your risk factors (such as having a new sexual partner) need to be reassessed to determine if screening should resume. Some women have medical problems that increase the chance of getting cervical cancer. In these cases, your health care provider may recommend more frequent screening and Pap tests. Colorectal Cancer  This type of cancer can be detected and often prevented.  Routine colorectal cancer screening usually begins at 54 years of age and continues through 54 years of age.  Your health care provider may recommend screening at an earlier age if you have risk factors for colon cancer.  Your health care provider may also recommend using home test kits to check for hidden blood in the stool.  A small camera at the end of a tube can be used to examine your colon directly (sigmoidoscopy or colonoscopy).  This is done to check for the earliest forms of colorectal cancer.  Routine screening usually begins at age 30.  Direct examination of the colon should be repeated every 5-10 years through 54 years of age. However, you may need to be screened more often if early forms of precancerous polyps or small growths are found. Skin Cancer  Check your skin from head to toe regularly.  Tell your health care provider about any new moles or changes in moles, especially if there is a change in a mole's shape or color.  Also tell your health care provider if you have a mole that is larger than the size of a pencil eraser.  Always use sunscreen. Apply sunscreen liberally and repeatedly throughout the day.  Protect yourself by wearing long sleeves, pants, a wide-brimmed hat, and sunglasses whenever you are outside. HEART DISEASE, DIABETES, AND HIGH BLOOD PRESSURE   High blood pressure causes heart disease and increases the risk of stroke. High blood pressure is more likely  to develop in:  People who have blood pressure in the high end of the normal range (130-139/85-89 mm Hg).  People who are overweight or obese.  People who are African American.  If you are 74-6 years of age, have your blood pressure checked every 3-5 years. If you are 79 years of age or older, have your blood pressure checked every year. You should have your blood pressure measured twice--once when you are at a hospital or clinic, and once when you are not at a hospital or clinic. Record the average of the two measurements. To check your blood pressure when you are not at a hospital or clinic, you can use:  An automated blood pressure machine at a pharmacy.  A home blood pressure monitor.  If you are between 88 years and 4 years old, ask your health care provider if you should take aspirin to prevent strokes.  Have regular diabetes screenings. This involves taking a blood sample to check your fasting blood sugar level.  If you  are at a normal weight and have a low risk for diabetes, have this test once every three years after 54 years of age.  If you are overweight and have a high risk for diabetes, consider being tested at a younger age or more often. PREVENTING INFECTION  Hepatitis B  If you have a higher risk for hepatitis B, you should be screened for this virus. You are considered at high risk for hepatitis B if:  You were born in a country where hepatitis B is common. Ask your health care provider which countries are considered high risk.  Your parents were born in a high-risk country, and you have not been immunized against hepatitis B (hepatitis B vaccine).  You have HIV or AIDS.  You use needles to inject street drugs.  You live with someone who has hepatitis B.  You have had sex with someone who has hepatitis B.  You get hemodialysis treatment.  You take certain medicines for conditions, including cancer, organ transplantation, and autoimmune conditions. Hepatitis C  Blood testing is recommended for:  Everyone born from [birth year] through 1964.  Anyone with known risk factors for hepatitis C. Sexually transmitted infections (STIs)  You should be screened for sexually transmitted infections (STIs) including gonorrhea and chlamydia if:  You are sexually active and are younger than 54 years of age.  You are older than 54 years of age and your health care provider tells you that you are at risk for this type of infection.  Your sexual activity has changed since you were last screened and you are at an increased risk for chlamydia or gonorrhea. Ask your health care provider if you are at risk.  If you do not have HIV, but are at risk, it may be recommended that you take a prescription medicine daily to prevent HIV infection. This is called pre-exposure prophylaxis (PrEP). You are considered at risk if:  You are sexually active and do not regularly use condoms or know the HIV status of your  partner(s).  You take drugs by injection.  You are sexually active with a partner who has HIV. Talk with your health care provider about whether you are at high risk of being infected with HIV. If you choose to begin PrEP, you should first be tested for HIV. You should then be tested every 3 months for as long as you are taking PrEP.  PREGNANCY   If you are premenopausal and you may become  pregnant, ask your health care provider about preconception counseling.  If you may become pregnant, take 400 to 800 micrograms (mcg) of folic acid every day.  If you want to prevent pregnancy, talk to your health care provider about birth control (contraception). OSTEOPOROSIS AND MENOPAUSE   Osteoporosis is a disease in which the bones lose minerals and strength with aging. This can result in serious bone fractures. Your risk for osteoporosis can be identified using a bone density scan.  If you are 27 years of age or older, or if you are at risk for osteoporosis and fractures, ask your health care provider if you should be screened.  Ask your health care provider whether you should take a calcium or vitamin D supplement to lower your risk for osteoporosis.  Menopause may have certain physical symptoms and risks.  Hormone replacement therapy may reduce some of these symptoms and risks. Talk to your health care provider about whether hormone replacement therapy is right for you.  HOME CARE INSTRUCTIONS   Schedule regular health, dental, and eye exams.  Stay current with your immunizations.   Do not use any tobacco products including cigarettes, chewing tobacco, or electronic cigarettes.  If you are pregnant, do not drink alcohol.  If you are breastfeeding, limit how much and how often you drink alcohol.  Limit alcohol intake to no more than 1 drink per day for nonpregnant women. One drink equals 12 ounces of beer, 5 ounces of wine, or 1 ounces of hard liquor.  Do not use street drugs.  Do  not share needles.  Ask your health care provider for help if you need support or information about quitting drugs.  Tell your health care provider if you often feel depressed.  Tell your health care provider if you have ever been abused or do not feel safe at home.   This information is not intended to replace advice given to you by your health care provider. Make sure you discuss any questions you have with your health care provider.   Document Released: 05/24/2011 Document Revised: 11/29/2014 Document Reviewed: 10/10/2013 Elsevier Interactive Patient Education [employer].

## 2016-07-27 NOTE — Assessment & Plan Note (Signed)
History of anemia requiring blood transfusion. Has not had a CBC checked in a well so CBC collected today. Will follow-up on results and contact patient.

## 2016-07-27 NOTE — Progress Notes (Signed)
     Subjective: Chief Complaint  Patient presents with  . Annual Exam     HPI: [name] [name] is a 55 y.o. presenting to clinic today for well woman/preventative visit.  Acute Concerns: None  Diet: Rice, vegetables, chicken. Prepares most of meals. Two meals a day. No snacking.   Exercise: Only at work. Walks a lot at work.  Sexual History: Sexually active with husband. Feels safe in her relationship.   Birth history: G3P2  LMP: No LMP recorded. Patient is postmenopausal. and s/p hysterectomy  POA/Living Will: Doesn't have one  Social:  Social History   Social History  . Marital status: Married    Spouse name: N/A  . Number of children: N/A  . Years of education: N/A   Social History Main Topics  . Smoking status: Current Every Day Smoker    Packs/day: 0.50    Years: 37.00    Types: Cigarettes  . Smokeless tobacco: Never Used  . Alcohol use No  . Drug use: No  . Sexual activity: Not Asked   Other Topics Concern  . None   Social History Narrative   Live with husband, daughter & her daughter's boyfriend & her grandchildren.   Married.   Work: [employer]   From: [place] but speaks English okay    Immunization:  Tdap/TD: Due  Influenza: Due  Cancer Screening:  Pap Smear: N/A due to s/p hysterectomy  Mammogram: Due  Colonoscopy: Up to Date  ROS reviewed and were negative unless otherwise noted in HPI.   Past Medical, Surgical, Social, and Family History Reviewed & Updated per EMR. Smoking status - Current smoker   Objective: BP 117/74   Pulse 74   Temp 98.4 F (36.9 C) (Oral)   Wt 76 lb (34.5 kg)   SpO2 100%   BMI 18.32 kg/m  Vitals and nursing notes reviewed  Physical Exam  Constitutional: She is oriented to person, place, and time and well-developed, well-nourished, and in no distress.  HENT:  Head: Normocephalic and atraumatic.  Mouth/Throat: Oropharynx is clear and moist.  Eyes: Conjunctivae and EOM are normal. Pupils are  equal, round, and reactive to light.  Neck: Normal range of motion. Neck supple. No thyromegaly present.  Cardiovascular: Normal rate, regular rhythm, normal heart sounds and intact distal pulses.   Pulmonary/Chest: Effort normal and breath sounds normal.  Abdominal: Soft. Bowel sounds are normal. She exhibits no distension and no mass. There is no tenderness.  Genitourinary: Vagina normal. No vaginal discharge found.  Musculoskeletal: Normal range of motion. She exhibits no edema, tenderness or deformity.  Lymphadenopathy:    She has no cervical adenopathy.  Neurological: She is alert and oriented to person, place, and time. She has normal sensation, normal strength and intact cranial nerves. Gait normal.  Skin: Skin is warm and dry.  Psychiatric: Mood and affect normal.    Assessment/Plan: Please see problem based Assessment and Plan   Orders Placed This Encounter  Procedures  . Tdap vaccine greater than or equal to 7yo IM  . Flu Vaccine QUAD 36+ mos IM  . Hepatitis C antibody  . HIV antibody  . CBC    Caryl AdaJazma Jerimiah Wolman, DO 07/27/2016, 2:15 PM PGY-3, [facility]

## 2016-07-27 NOTE — Assessment & Plan Note (Addendum)
Doing well. Collected HIV and hep C screening. Received tetanus and flu vaccine. Information given for mammogram. Information given for advanced directive. Counseled patient on healthy lifestyle including aerobic exercise. Handout given.

## 2016-07-28 ENCOUNTER — Encounter: Payer: Self-pay | Admitting: Obstetrics and Gynecology

## 2016-07-28 LAB — HIV ANTIBODY (ROUTINE TESTING W REFLEX): HIV 1&2 Ab, 4th Generation: NONREACTIVE

## 2016-07-28 LAB — HEPATITIS C ANTIBODY: HCV Ab: NEGATIVE

## 2016-09-30 ENCOUNTER — Ambulatory Visit (INDEPENDENT_AMBULATORY_CARE_PROVIDER_SITE_OTHER): Payer: 59 | Admitting: Family Medicine

## 2016-09-30 VITALS — BP 131/79 | HR 74 | Temp 98.1°F | Wt 78.0 lb

## 2016-09-30 DIAGNOSIS — Z550 Illiteracy and low-level literacy: Secondary | ICD-10-CM | POA: Diagnosis not present

## 2016-09-30 DIAGNOSIS — H547 Unspecified visual loss: Secondary | ICD-10-CM

## 2016-09-30 DIAGNOSIS — R413 Other amnesia: Secondary | ICD-10-CM

## 2016-09-30 DIAGNOSIS — R232 Flushing: Secondary | ICD-10-CM

## 2016-09-30 NOTE — Assessment & Plan Note (Addendum)
Patient with nearly normal MoCA today in setting of language barrier (23/30) and 4/5 on delayed recall. Completely independent with ADLs and IADLs. Normal neurological exam. Reassured patient today and discussed strategies to help her remember things. She also seems to have some underlying anxiety which may be interfering with her memory. Recommended follow up with her PCP to further address this issue.

## 2016-09-30 NOTE — Assessment & Plan Note (Addendum)
Patient with bilateral visual acuity of 20/50 to 20/70 with corrective eyewear. Instructed patient to follow up with optometry to have prescription renewed.

## 2016-09-30 NOTE — Patient Instructions (Signed)

## 2016-09-30 NOTE — Assessment & Plan Note (Signed)
Likely vasomotor symptoms related to menopause. Will defer to PCP for further treatment/evaluation. Consider starting SSRI/SNRI vs short course of hormone replacement.

## 2016-09-30 NOTE — Progress Notes (Signed)
[facility]:   Patient is accompanied by: Interpretor and Building control surveyorL teacher Patient's lives with their family. Patient information was obtained from patient. History/Exam limitations: none. Primary Care Provider: Caryl AdaJazma Phelps, DO Referring provider: Caryl AdaJazma Phelps, DO Reason for referral: Memory disturbances  History is provided by the patient via [place] interpretor.   History CC: Increased forgetfulness.  Family members frustrated with her repeating statements. No head trauma resulting in loss of consciousness, though says that she does bump her head frequently at work. No history of meningitis or encephalitis.   Cognitive impairment concern What problems with thinking are there? memory loss  When were the changes first noticed? Started this year.   Did this change occur abruptly or gradually? gradual  How have the changes progressed since then? unchanged  Does their level of alertness change throughout the day? no  Is their speech disorganized, rambling? no  Has there been any tremors or abnormal movements? no  Have they had in hallucinations or delusions: no.   Have they appeared more anxious or sad lately? More sad and anxious lately. 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   Do they still have interests or activities they enjoy doing? Yes. Likes to go to church. Likes to garden.   How has their appetite been lately? show no change  How has their sleep been lately? Difficulty sleeping because of hot flashes.   Problem behaviors: None   Compared to 5 to 10 years ago, how is the patient at:  Remembering things about family and friends e.g. names, occupations, birthdays, addresses? are worsening  Remembering things that have happened recently? are worsening Recalling conversations a few days later? are worsening  Remembering what day and month it is? show no change  Remembering where things are usually kept? are worsening  Losing things? are  worsening  Learning to use a new gadget or machine around the house? show no change  Learning new things in general? show no change. Currently learning English as a second language.   Following a story in a book or on TV? show no change  Handling money for shopping? show no change  Handling financial matters, e.g. their pension, dealing with the bank? show no change  Able to cope with unexpected events? show no change  Getting lost? show no change  Asking same questions repeatedly or telling the same story repeatedly to the same person(s)? yes   Geriatric Depression Scale:  1. Are you basically satisfied with your life? Yes 2. Have you dropped many of your activities and interests? No  3. Do you feel that your life is empty? No 4. Do you often get bored? Yes 5. Are you in good spirits most of the time? Yes 6. Are you afraid that something bad is going to happen to you?  NO  7. Do you feel happy most of the time? YES  8. Do you often feel helpless?  NO  9. Do you prefer to stay at home, rather than going out and doing new things? NO  10. Do you feel you have more problems with memory than most? YES 11. Do you think it is wonderful to be alive now? YES   12. Do you feel pretty worthless the way you are now? NO  13. Do you feel full of energy? YES 14. Do you feel that your situation is hopeless? NO  15. Do you think that most people are better off than you are? YES  Total Score: 3  HPI by problems:   Hot Flashes Patient with hot flashes nightly that wake her up. Feels flushing in her neck and face during these episodes. Patient had a hysterectomy in 1994 and has not had menstrual periods since then.   Medications:  Outpatient Encounter Prescriptions as of 09/30/2016  Medication Sig  . acetaminophen (TYLENOL) 500 MG tablet Take 1 tablet (500 mg total) by mouth every 6 (six) hours as needed.  . docusate sodium (COLACE) 100 MG capsule Take 1 capsule (100 mg total) by mouth 2  (two) times daily.  . feeding supplement, ENSURE ENLIVE, (ENSURE ENLIVE) LIQD You can buy this at any drug store or grocery store.  Marland Kitchen ibuprofen (ADVIL,MOTRIN) 600 MG tablet Take 1 tablet (600 mg total) by mouth every 8 (eight) hours as needed.  . mirtazapine (REMERON) 7.5 MG tablet Take 1 tablet (7.5 mg total) by mouth at bedtime.  Marland Kitchen omeprazole (PRILOSEC) 20 MG capsule Take 1 capsule (20 mg total) by mouth daily.   No facility-administered encounter medications on file as of 09/30/2016.    History Patient Active Problem List   Diagnosis Date Noted  . Decreased visual acuity 09/30/2016  . Hot flashes 09/30/2016  . Healthcare maintenance 07/27/2016  . Absolute anemia 07/27/2016  . Esophageal reflux 06/23/2016  . Memory difficulties 06/23/2016  . Insomnia 04/29/2016  . Abdominal pain 01/15/2016  . 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 S/P colostomy (HCC) 03/02/2015  . Neuropathy of foot 12/10/2013  . Skin lesion 11/12/2011  . Corn of toe 10/18/2011  . TOBACCO DEPENDENCE 01/19/2007  . HEARING LOSS NOS OR DEAFNESS 01/19/2007  . MENOPAUSAL SYNDROME 01/19/2007   Past Medical History:  Diagnosis Date  . Constipation   . Constipation   . Difficulty sleeping   . S/P colostomy [facility]) 03/02/2015   March 2016: colon perforation following screening colonoscopy    Past Surgical History:  Procedure Laterality Date  . ABDOMINAL HYSTERECTOMY  1994  . APPENDECTOMY  1993  . CESAREAN SECTION  1992  . COLONOSCOPY N/A 05/30/2015   Procedure: COLONOSCOPY;  Surgeon: Romie Levee, MD;  Location: [facility];  Service: Endoscopy;  Laterality: N/A;  . COLONOSCOPY WITH PROPOFOL N/A 06/12/2015   Procedure: COLONOSCOPY WITH PROPOFOL;  Surgeon: Romie Levee, MD;  Location: [facility];  Service: Endoscopy;  Laterality: N/A;  . COLOSTOMY  01/2015  . COLOSTOMY TAKEDOWN N/A 07/02/2015   Procedure: LAPAROSCOPIC COLOSTOMY REVERSAL SPLENIC FLEXURE MOBILIZATION AND PARTIAL COLECTOMY;  Surgeon: Romie Levee,  MD;  Location: [facility];  Service: General;  Laterality: N/A;  . DILATION AND CURETTAGE OF UTERUS  1990   "miscarriage"  . LAPAROTOMY N/A 02/20/2015   Procedure: PARTIAL COLECTOMY END  ILEOSTOMY AND HARTMAN'S PROCEDURE;  Surgeon: Romie Levee, MD;  Location: [facility];  Service: General;  Laterality: N/A;   Family History  Problem Relation Age of Onset  . Colon cancer Neg Hx    Social History   Social History  . Marital status: Married    Spouse name: N/A  . Number of children: N/A  . Years of education: N/A   Social History Main Topics  . Smoking status: Current Every Day Smoker    Packs/day: 0.50    Years: 37.00    Types: Cigarettes  . Smokeless tobacco: Never Used  . Alcohol use No  . Drug use: No  . Sexual activity: Not Currently   Other Topics Concern  . None   Social History Narrative   Live with husband, daughter & her daughter's boyfriend &  her grandchildren.   Married.   Work: [employer]   From: [place] but speaks English okay    Family History of Dementia: No  Basic Activities of Daily Living  ADLs Independent Needs Assistance Dependent  Bathing x    Dressing x    Ambulation x    Toileting x    Eating x     Instrumental Activities of Daily Living IADL Independent Needs Assistance Dependent  Cooking x    Housework x    Manage Medications x    Manage the telephone x    Shopping for food, clothes, Meds, etc x    Use transportation x    Manage Finances x       FALLS in last five office visits:  Fall Risk  04/26/2016 12/10/2013  Falls in the past year? No No    Health Maintenance reviewed: Immunization History  Administered Date(s) Administered  . Influenza,inj,Quad PF,36+ Mos 07/27/2016  . Influenza-Unspecified 07/23/2014  . Pneumococcal Polysaccharide-23 11/07/2014  . Td 11/22/1996  . Tdap 07/27/2016   Health Maintenance Topics with due status: Overdue     Topic Date Due   MAMMOGRAM 04/16/2012    Diet: Regular Nutritional  supplements: None  Geriatric Syndromes: Constipation yes ,  Incontinence no  Dizziness no   Syncope no   Skin problems no   Visual Impairment yes   Hearing impairment no  Eating impairment no  Impaired Memory or Cognition yes   Behavioral problems no   Sleep problems yes   Weight loss no    ROS Per HPI  Vital Signs Weight: 78 lb (35.4 kg) Body mass index is 18.81 kg/m. CrCl cannot be calculated (Patient's most recent lab result is older than the maximum 21 days allowed.). Body surface area is 1.16 meters squared. Vitals:   09/30/16 1342  BP: 131/79  Pulse: 74  Temp: 98.1 F (36.7 C)  TempSrc: Oral  SpO2: (!) 89%  Weight: 78 lb (35.4 kg)   Wt Readings from Last 3 Encounters:  09/30/16 78 lb (35.4 kg)  07/27/16 76 lb (34.5 kg)  06/21/16 75 lb (34 kg)    Hearing Screening   125Hz  250Hz  500Hz  1000Hz  2000Hz  3000Hz  4000Hz  6000Hz  8000Hz   Right ear:   40 40 40  40    Left ear:   40 40 40  40      Visual Acuity Screening   Right eye Left eye Both eyes  Without correction: 20/200 20/200 20/70  With correction:       Physical Examination:  VS reviewed GEN: Alert, Cooperative, Groomed, NAD LUNGS: No Acc mm use, speaking in full sentences EXT: No peripheral leg edema. Feet without deformity or lesions. Palpable bilateral pedal pulses.  SKIN: No lesion nor rashes of face/trunk/extremities Neuro: Oriented to person, place, and time; Strength: 5/5 Bil. UE and LE symmetric; Sensation: Intact grossly to touch all four extremities; Cerebellar: Finger-to-Nose intact, Rhomberg negative; Muscle Tone normal; Tremor not present DTR: Bilateral Bicep 2+, Bilateral Triceps 2+, Bilateral Knees 2+, Bilateral Ankles 1+ Gait: No significant path deviation, Step-through present  Psych: Normal affect/thought/speech/language   Mini-Mental State Examination or Montreal Cognitive Assessment:  Patient did not require additional cues or prompts to complete tasks. Patient was cooperative  and attentive to testing tasks Patient did  appear motivated to perform well  Geriatric Depression Scale: 3 / 15  Labs  Lab Results  Component Value Date   VITAMINB12 397 12/10/2013    No results found for: FOLATE  Lab Results  Component Value Date   TSH 0.418 01/01/2014    No results found for: RPR    Chemistry      Component Value Date/Time   NA 139 07/16/2015 0356   K 3.7 07/16/2015 0356   CL 104 07/16/2015 0356   CO2 29 07/16/2015 0356   BUN 7 07/16/2015 0356   CREATININE 0.66 07/16/2015 0356   CREATININE 0.57 12/10/2013 1103      Component Value Date/Time   CALCIUM 9.0 07/16/2015 0356   ALKPHOS 77 07/15/2015 1110   AST 18 07/15/2015 1110   ALT 13 (L) 07/15/2015 1110   BILITOT 0.8 07/15/2015 1110       Lab Results  Component Value Date   HGBA1C 5.4 06/27/2015      Lab Results  Component Value Date   WBC 7.9 07/27/2016   HGB 11.9 07/27/2016   HCT 38.4 07/27/2016   MCV 72.6 (L) 07/27/2016   PLT 200 07/27/2016   Imaging None   Assessment and Plan: Problem List Items Addressed This Visit      Cardiovascular and Mediastinum   Hot flashes    Likely vasomotor symptoms related to menopause. Will defer to PCP for further treatment/evaluation. Consider starting SSRI/SNRI vs short course of hormone replacement.         Other   Memory difficulties    Patient with nearly normal MoCA today in setting of language barrier (23/30) and 4/5 on delayed recall. Completely independent with ADLs and IADLs. Normal neurological exam. Reassured patient today and discussed strategies to help her remember things. She also seems to have some underlying anxiety which may be interfering with her memory. Recommended follow up with her PCP to further address this issue.       Decreased visual acuity    Patient with bilateral visual acuity of 20/50 with corrective eyewear. Instructed patient to follow up with optometry to have prescription renewed.        Other Visit  Diagnoses    Complaints of memory disturbance    -  Primary      Support System Strengths Supportive Relationships, Family, Friends, Church and Hopefulness  Contact: [phone] (home) [phone] (work)  Patient to Follow up with PCP for hot flashes and to further evaluate anxiety.

## 2016-10-01 ENCOUNTER — Encounter: Payer: Self-pay | Admitting: Family Medicine

## 2016-10-01 DIAGNOSIS — Z550 Illiteracy and low-level literacy: Secondary | ICD-10-CM

## 2016-10-01 HISTORY — DX: Illiteracy and low-level literacy: Z55.0

## 2016-10-01 NOTE — Progress Notes (Signed)
I have interviewed and examined the patient with Dr Jimmey RalphParker.  I agree with their assessments and plans as documented in their visit note.  The interview and exam was conducted with licensed interpreter in [place] language. There was no MoCA test in [place] so the English version of the MoCA-Basic was used with the interpreter translating our instructions for the test.  [name] [name] has less than 1 year formal education and while learning English both spoken and written currently, remains functionally illiterate.  Given these obstacles to testing, [name] [name] performed fairly well on the cognitive testing with 25 to 26 out of 30 points on the MoCA-Basic test. Points lost in fluency and arithmetic which may be related more to formal educational attainment than her cognitive capacity.   I suspect that [name] [name]'s self-perceived difficulty with her short-term memory may have more to do with distraction and worry impairing her encoding of short-term memory rather than an organic process.    No further work up is recommended at this time.  Patient does have reduced Visual Acuity around 20/50 to 20/70 O.U.   Recommend she have optometric evaluation to possible refractive errors.   It sounds as if [name] [name] may be experiencing significant menopausal vasomotor instability that is impairing her sleep and quality of life.  I recommend she follow up with Dr Doroteo GlassmanPhelps for consideration of short term estrogen therapy or SSRI tx.  Pt currently on mirtazapine.   40 minutes face to face where spent in total with counseling / coordination of care took more than 50% of the total time. Counseling involved discussion of the psychological test tests and function test results with patient and her community representative. Discussions of methods to compensate for age-related memory decline and role of worry in preventing the formation of memories.       Follow up with Dr Doroteo GlassmanPhelps as needed

## 2016-10-27 ENCOUNTER — Ambulatory Visit: Payer: 59 | Admitting: Obstetrics and Gynecology

## 2016-10-27 NOTE — Progress Notes (Deleted)
     Subjective: No chief complaint on file.    HPI: [name] [name] is a 55 y.o. presenting to clinic today to discuss the following:  # # #  Health Maintenance: Mammogram ***    ROS noted in HPI.  Past Medical, Surgical, Social, and Family History Reviewed & Updated per EMR. Smoking status - ***   Objective: There were no vitals taken for this visit. Vitals and nursing notes reviewed  Physical Exam   No results found for this or any previous visit (from the past 72 hour(s)).  Assessment/Plan: Please see problem based Assessment and Plan  Health Maintainance:   No orders of the defined types were placed in this encounter.   No orders of the defined types were placed in this encounter.    Caryl AdaJazma Christabella Alvira, DO 10/27/2016, 6:51 AM PGY-3, [facility]

## 2016-10-29 ENCOUNTER — Telehealth: Payer: Self-pay | Admitting: Obstetrics and Gynecology

## 2016-10-29 NOTE — Telephone Encounter (Signed)
Pt did not answer the phone. I couldn't leave a message. - Mesha Guinyard

## 2016-12-03 ENCOUNTER — Ambulatory Visit (INDEPENDENT_AMBULATORY_CARE_PROVIDER_SITE_OTHER): Payer: 59 | Admitting: Physician Assistant

## 2016-12-03 VITALS — BP 130/80 | HR 65 | Temp 98.0°F | Resp 16 | Ht <= 58 in | Wt 76.4 lb

## 2016-12-03 DIAGNOSIS — B070 Plantar wart: Secondary | ICD-10-CM | POA: Diagnosis not present

## 2016-12-03 DIAGNOSIS — M79674 Pain in right toe(s): Secondary | ICD-10-CM | POA: Diagnosis not present

## 2016-12-03 NOTE — Progress Notes (Signed)
Patient ID: [name] [name], female     DOB: Nov 09, 1961, 56 y.o.    MRN: [id number]  PCP: Caryl AdaJazma Phelps, DO  Chief Complaint  Patient presents with  . Toe Pain    Right foot, Great toe    Subjective:   This patient is new to this practice and presents for evaluation of RIGHT great toe pain. She is accompanied by a Nurse, learning disabilitytranslator, Jamelle HaringSnow.  This has been a problem for about a year. No trauma or injury that she can recall. Initially, it wasn't that painful. Now it is more painful, every day. Rates it "more than average," 6-7/10. Feels like something is "sucking" in the toe. Pain is worse with weight bearing, movement of the toe. No alleviating factors, and has pain at rest. No benefit from acetaminophen or ibuprofen.  She really thinks that surgery would help with this. She knows someone who had the same thing who felt better following surgery.  Works in housekeeping at [employer].  Review of Systems As above.  Prior to Admission medications   Medication Sig Start Date End Date Taking? Authorizing Provider  acetaminophen (TYLENOL) 500 MG tablet Take 1 tablet (500 mg total) by mouth every 6 (six) hours as needed. 01/14/16  Yes Nani RavensAndrew M Wight, MD  docusate sodium (COLACE) 100 MG capsule Take 1 capsule (100 mg total) by mouth 2 (two) times daily. 01/14/16  Yes Nani RavensAndrew M Wight, MD  ibuprofen (ADVIL,MOTRIN) 600 MG tablet Take 1 tablet (600 mg total) by mouth every 8 (eight) hours as needed. 01/14/16  Yes Nani RavensAndrew M Wight, MD  omeprazole (PRILOSEC) 20 MG capsule Take 1 capsule (20 mg total) by mouth daily. 06/23/16  Yes Pincus LargeJazma Y Phelps, DO     No Known Allergies   Patient Active Problem List   Diagnosis Date Noted  . Literacy level of illiterate 10/01/2016  . Reduced visual acuity 09/30/2016  . Hot flashes 09/30/2016  . Healthcare maintenance 07/27/2016  . Absolute anemia 07/27/2016  . Esophageal reflux 06/23/2016  . Memory difficulties 06/23/2016  . Insomnia 04/29/2016  . Abdominal  pain 01/15/2016  . 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 S/P colostomy (HCC) 03/02/2015  . Neuropathy of foot 12/10/2013  . Skin lesion 11/12/2011  . Corn of toe 10/18/2011  . TOBACCO DEPENDENCE 01/19/2007  . HEARING LOSS NOS OR DEAFNESS 01/19/2007  . MENOPAUSAL SYNDROME 01/19/2007     Family History  Problem Relation Age of Onset  . Colon cancer Neg Hx      Social History   Social History  . Marital status: Married    Spouse name: N/A  . Number of children: 2  . Years of education: 0   Occupational History  . ENVIRONMENTAL SERVICES [employer]   Social History Main Topics  . Smoking status: Current Every Day Smoker    Packs/day: 0.50    Years: 37.00    Types: Cigarettes  . Smokeless tobacco: Never Used  . Alcohol use No  . Drug use: No  . Sexual activity: Not Currently   Other Topics Concern  . Not on file   Social History Narrative   Live with husband, daughter & her daughter's boyfriend & her grandchildren.   Married.   Illiterate in any language.   Less than one year of formal education in [place].    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 She appears well-developed and well-nourished. She is active and cooperative. No distress.  BP 130/80   Pulse 65   Temp 98 F (36.7 C) (Oral)   Resp 16   Ht 4\' 7"  (1.397 m)   Wt 76 lb 6.4 oz (34.7 kg)   SpO2 97%   BMI 17.76 kg/m    Eyes: Conjunctivae are normal.  Pulmonary/Chest: Effort normal.  Neurological: She is alert and oriented to person, place, and time.  Skin: Skin is warm and dry. Lesion (3 mm verrucal lesion on the medial aspect of the plantar surface of the RIGHT great toe. 1 cm surrounding hyperkeratotic skin. No erythema. No drainage.) noted.  Toenails are dystrophic but not ingrowing. Neatly trimmed.  Psychiatric: She has a normal mood and affect. Her speech is  normal and behavior is normal.    With permission, the lesion was pared with a 15 blade.      Assessment & Plan:  1. Pain of right great toe Due to #2.  2. Plantar wart, right foot Pared as above. Recommend OTC acid pad application. If no improvement, RTC for cryotherapy, but given her work, elect to use less aggressive treatment that would require her to take time off if possible.   Fernande Bras, PA-C Physician Assistant-Certified Primary Care at [facility]

## 2016-12-03 NOTE — Patient Instructions (Addendum)
Now that the wart has been thinned, use an OTC acid pad product (like Dr. Margart SicklesScholl's) to dissolve the remaining wart.  If the acid pads do not resolve the problem, freezing therapy can help.    IF you received an x-ray today, you will receive an invoice from [facility]. Please contact [facility] at [phone] with questions or concerns regarding your invoice.   IF you received labwork today, you will receive an invoice from [facility]. Please contact [facility] at [phone] with questions or concerns regarding your invoice.   Our billing staff will not be able to assist you with questions regarding bills from these companies.  You will be contacted with the lab results as soon as they are available. The fastest way to get your results is to activate your My Chart account. Instructions are located on the last page of this paperwork. If you have not heard from us regarding the results in 2 weeks, please contact this office.

## 2017-06-13 ENCOUNTER — Ambulatory Visit: Payer: Self-pay | Admitting: Internal Medicine

## 2017-06-13 NOTE — Progress Notes (Signed)
   [facility] Phone: [phone]   Date of Visit: 06/14/2017   HPI:  Pruritis:  - patient reports that she has itching of the arms, thighs and abdomen since June.  - itching is all the time except when she is in the shower as the warm water is soothing - she also has bumps on her abdomen, and in her axillae  - reports that her husband had itching in June but now is okay  - no changes in creams, fragrances, soaps, detergents  - symptoms have been stable - no fevers/chills  ROS: See HPI.  PMFSH:  PMH: Esophageal Reflux Hearing Loss S/p colostomy after colon perforation during screening colonoscopy  Tobacco Dependence   PHYSICAL EXAM: BP 102/68   Pulse 75   Temp 98.9 F (37.2 C) (Oral)   Ht 4\' 7"  (1.397 m)   Wt 74 lb (33.6 kg)   SpO2 99%   BMI 17.20 kg/m  GEN: NAD HEENT:  EOMI, sclera clear  CV: RRR, no murmurs, rubs, or gallops PULM: CTAB, normal effort ABD: Soft, nontender, nondistended, NABS, no organomegaly SKIN: erythematous papules consistent with bug bites on the abdomen and axillae  EXTR: No lower extremity edema or calf tenderness PSYCH: Mood and affect euthymic, normal rate and volume of speech NEURO: Awake, alert, no focal deficits grossly, normal speech   ASSESSMENT/PLAN:  1. Bug bite, initial encounter Possibly bed bugs. No sign of superimposed infection.  - kenalog 0.1% cream BID x 7 days for itching - Claritin 10mg  PO daily for itching - discussed proper washing of linen and recommended having exterminator come to the home to inspect    Palma HolterKanishka G Gunadasa, MD PGY 3  Family Medicine

## 2017-06-14 ENCOUNTER — Ambulatory Visit (INDEPENDENT_AMBULATORY_CARE_PROVIDER_SITE_OTHER): Payer: Self-pay | Admitting: Internal Medicine

## 2017-06-14 ENCOUNTER — Encounter: Payer: Self-pay | Admitting: Internal Medicine

## 2017-06-14 VITALS — BP 102/68 | HR 75 | Temp 98.9°F | Ht <= 58 in | Wt 74.0 lb

## 2017-06-14 DIAGNOSIS — W57XXXA Bitten or stung by nonvenomous insect and other nonvenomous arthropods, initial encounter: Secondary | ICD-10-CM

## 2017-06-14 DIAGNOSIS — S30861A Insect bite (nonvenomous) of abdominal wall, initial encounter: Secondary | ICD-10-CM

## 2017-06-14 MED ORDER — LORATADINE 10 MG PO TABS: 10.0000 mg | ORAL_TABLET | Freq: Every day | ORAL | 0 refills | Status: DC

## 2017-06-14 MED ORDER — TRIAMCINOLONE ACETONIDE 0.1 % EX CREA: 1.0000 "application " | TOPICAL_CREAM | Freq: Two times a day (BID) | CUTANEOUS | 0 refills | Status: DC

## 2017-06-14 NOTE — Patient Instructions (Addendum)
Your itching is likely caused by bug bites.   As we discussed, please re-wash all of your linens and clothing in hot water and see if you can get an exterminator to inspect your home.   I prescribed Claritin to help with the itching as well as a cream that you can use on your skin for itching.   Please make a follow up visit for your physical  Bedbugs Bedbugs are tiny bugs that live in and around beds. During the day, they stay hidden. At night, they come out and bite. Where are bedbugs found? Bedbugs can be found anywhere. It does not matter if a place is clean or dirty. They are often found in:  Hotels.  Shelters.  Dorms.  Hospitals.  Nursing homes.  Places where there are many birds or bats.  What are bedbug bites like? A bedbug bite leaves a small red bump with a darker red dot in the middle. The bump may show up soon after a person is bitten or a day or more later. Bedbug bites usually do not hurt, but they may itch. Most people do not need treatment for bedbug bites. The bumps usually go away on their own in a few days. How do I check for bedbugs? Bedbugs are reddish-brown, oval, and flat. They are very small and they cannot fly. Look for bedbugs in these places:  On mattresses, bed frames, headboards, and box springs.  On drapes and curtains in bedrooms.  Under the carpet in bedrooms.  Behind electrical outlets.  Behind any wallpaper that is peeling.  Inside luggage.  Also look for black or red spots or stains on or near the bed. What should I do if I find bedbugs? When Traveling Check your clothes, suitcase, and belongings for bedbugs before you go back home. You may want to throw away anything that has bedbugs on it. At Home Your bedroom may need to be treated by a pest control expert. You may also need to throw away mattresses or luggage. To help keep bedbugs from coming back, you may want to:  Put a plastic cover over your mattress.  Wash your clothes  and bedding in water that is hotter than 120F (48.9C). Dry them on a hot setting.  Vacuum often around the bed and in all of the cracks where the bugs might hide.  Check all used furniture, bedding, or clothes that you bring into your home.  Get rid of bird nests and bat roosts that are near your home.  In Your Bed Try wearing pajamas that have long sleeves and pant legs. Bedbugs usually bite areas of the skin that are not covered. This information is not intended to replace advice given to you by your health care provider. Make sure you discuss any questions you have with your health care provider. Document Released: 02/23/2011 Document Revised: 04/15/2016 Document Reviewed: 11/04/2014 Elsevier Interactive Patient Education  [employer]2018 Elsevier Inc.

## 2017-06-20 NOTE — Progress Notes (Signed)
   [name] Phone: [phone]   Date of Visit: 06/21/2017   HPI:  Patient presents today for a well woman exam.   Concerns today:  Abdominal Pain:  - Patient has a history of colonic perforation during a screening colonoscopy. She had a colostomy that was reversed. Her last surgery was in March 2016 per patient. She reports of constant abdominal pain daily on the left side since then. Symptoms have been stable. Pain is worse with movement such as when she works in her garden and with eating. Denies any nausea or vomiting. No blood in stool. Patient reports a history of constipation. She has tried MiraLAX and Dulcolax which have caused her abdominal cramping. She currently uses a type of tea called Cassia Angostifolia which helps with her constipation. She declines any other medications for her constipation but asks for something for pain. Denies unintentional weight loss, fevers, chills, night sweats.   Periods: postmenopausal (hysterectomy in the 90s at [facility]). Contraception: none Pelvic symptoms: no vaginal d/c, itching, or vaginal bleeding  Sexual activity: yes, with her husband Pap smear status: total abdominal Exercise: no Diet: Well-balanced diet  Smoking: yes, 4 cigarettes a day on average for 12-14 years.   Alcohol: no Drugs: no Mood: PHQ2: negative Dentist: None .she denies any issues with her teeth  ROS: See HPI Review of Systems  Constitutional: Negative for chills, fever and weight loss.  HENT: Negative for congestion and sore throat.   Eyes: Negative for blurred vision and double vision.  Respiratory: Negative for cough and shortness of breath.   Cardiovascular: Negative for chest pain and palpitations.  Gastrointestinal: Positive for constipation. Negative for blood in stool, nausea and vomiting.  Genitourinary: Negative for dysuria and frequency.  Neurological: Negative for dizziness, focal weakness and headaches.    Endo/Heme/Allergies: Negative for polydipsia.  Psychiatric/Behavioral: Negative for depression and suicidal ideas.     PMFSH:  Cancers in family: No family history per patient's knowledge  PHYSICAL EXAM: BP 140/90   Pulse 79   Temp 98.3 F (36.8 C) (Oral)   Ht 4\' 7"  (1.397 m)   Wt 75 lb (34 kg)   SpO2 99%   BMI 17.43 kg/m  Gen: NAD, pleasant, cooperative HEENT: NCAT, PERRL, no palpable thyromegaly or anterior cervical lymphadenopathy Heart: RRR, no murmurs Lungs: CTAB, NWOB Abdomen: soft, nontender to palpation Neuro: grossly nonfocal, speech normal  ASSESSMENT/PLAN:  # Health maintenance:  -STD screening: Declined -pap smear: Not indicated. Status post abdominal hysterectomy. -mammogram: Declined -lipid screening: Future order. Patient to come in fasting for a lab visit. will also obtain CBC and CMP -DEXA: Indicated for this 56 year old female, with smoking history, and low BMI. Spoke with the patient over the phone after visit and patient is agreeable -immunizations: Up to date  Abdominal pain:  Unclear etiology. Is nontoxic appearing with stable vitals. Possibly related to her previous surgeries. FOBT negative. Per colonoscopy report if patient has any further abdominal symptoms it was recommended that she follow-up with GI again. Therefore will refer to GI. Discussed with the attending. - referral to GI   Follow up pending labs.   Palma HolterKanishka G Zamya Culhane, MD PGY 3 [facility]

## 2017-06-21 ENCOUNTER — Encounter: Payer: Self-pay | Admitting: Internal Medicine

## 2017-06-21 ENCOUNTER — Ambulatory Visit (INDEPENDENT_AMBULATORY_CARE_PROVIDER_SITE_OTHER): Payer: 59 | Admitting: Internal Medicine

## 2017-06-21 VITALS — BP 140/90 | HR 79 | Temp 98.3°F | Ht <= 58 in | Wt 75.0 lb

## 2017-06-21 DIAGNOSIS — R109 Unspecified abdominal pain: Secondary | ICD-10-CM | POA: Diagnosis not present

## 2017-06-21 DIAGNOSIS — E2839 Other primary ovarian failure: Secondary | ICD-10-CM

## 2017-06-21 DIAGNOSIS — Z720 Tobacco use: Secondary | ICD-10-CM | POA: Diagnosis not present

## 2017-06-21 DIAGNOSIS — Z0000 Encounter for general adult medical examination without abnormal findings: Secondary | ICD-10-CM

## 2017-06-21 LAB — HEMOCCULT GUIAC POC 1CARD (OFFICE): FECAL OCCULT BLD: NEGATIVE

## 2017-06-21 MED ORDER — ACETAMINOPHEN 325 MG PO TABS: 650.0000 mg | ORAL_TABLET | Freq: Four times a day (QID) | ORAL | 0 refills | Status: DC | PRN

## 2017-06-21 NOTE — Patient Instructions (Signed)
Thank you for coming We will refer you to the GI doctor for your abdominal pain

## 2017-06-22 ENCOUNTER — Telehealth: Payer: Self-pay | Admitting: Internal Medicine

## 2017-06-22 DIAGNOSIS — Z0000 Encounter for general adult medical examination without abnormal findings: Secondary | ICD-10-CM

## 2017-06-22 DIAGNOSIS — E2839 Other primary ovarian failure: Secondary | ICD-10-CM

## 2017-06-22 NOTE — Telephone Encounter (Signed)
Called patient's daughter's phone (per patient preference) to discuss that we should get general blood work for health maintenance. Also discussed the indication for Dexa scan for her. She would like to proceed with the above. I ordered all of the above. Patient knows to make a lab visit and come in fasting for her blood draw. Will call the patient after results return.

## 2017-06-24 ENCOUNTER — Other Ambulatory Visit: Payer: 59

## 2017-06-24 DIAGNOSIS — Z0000 Encounter for general adult medical examination without abnormal findings: Secondary | ICD-10-CM | POA: Diagnosis not present

## 2017-06-24 DIAGNOSIS — Z720 Tobacco use: Secondary | ICD-10-CM

## 2017-06-24 DIAGNOSIS — R109 Unspecified abdominal pain: Secondary | ICD-10-CM | POA: Diagnosis not present

## 2017-06-25 LAB — COMPREHENSIVE METABOLIC PANEL
ALBUMIN: 4.2 g/dL (ref 3.5–5.5)
ALT: 6 IU/L (ref 0–32)
AST: 13 IU/L (ref 0–40)
Albumin/Globulin Ratio: 1.6 (ref 1.2–2.2)
Alkaline Phosphatase: 106 IU/L (ref 39–117)
BUN / CREAT RATIO: 11 (ref 9–23)
BUN: 7 mg/dL (ref 6–24)
Bilirubin Total: 0.7 mg/dL (ref 0.0–1.2)
CALCIUM: 9.5 mg/dL (ref 8.7–10.2)
CO2: 27 mmol/L (ref 20–29)
CREATININE: 0.65 mg/dL (ref 0.57–1.00)
Chloride: 100 mmol/L (ref 96–106)
GFR, EST AFRICAN AMERICAN: 116 mL/min/{1.73_m2} (ref >59)
GFR, EST NON AFRICAN AMERICAN: 100 mL/min/{1.73_m2} (ref >59)
GLOBULIN, TOTAL: 2.6 g/dL (ref 1.5–4.5)
Glucose: 119 mg/dL — ABNORMAL HIGH (ref 65–99)
Potassium: 3.7 mmol/L (ref 3.5–5.2)
SODIUM: 142 mmol/L (ref 134–144)
Total Protein: 6.8 g/dL (ref 6.0–8.5)

## 2017-06-25 LAB — CBC
HEMATOCRIT: 36.9 % (ref 34.0–46.6)
Hemoglobin: 11.1 g/dL (ref 11.1–15.9)
MCH: 22.2 pg — AB (ref 26.6–33.0)
MCHC: 30.1 g/dL — AB (ref 31.5–35.7)
MCV: 74 fL — ABNORMAL LOW (ref 79–97)
Platelets: 221 10*3/uL (ref 150–379)
RBC: 4.99 x10E6/uL (ref 3.77–5.28)
RDW: 16.3 % — AB (ref 12.3–15.4)
WBC: 4.8 10*3/uL (ref 3.4–10.8)

## 2017-06-25 LAB — LIPID PANEL
Chol/HDL Ratio: 3.4 ratio (ref 0.0–4.4)
Cholesterol, Total: 161 mg/dL (ref 100–199)
HDL: 47 mg/dL (ref >39)
LDL CALC: 94 mg/dL (ref 0–99)
TRIGLYCERIDES: 100 mg/dL (ref 0–149)
VLDL Cholesterol Cal: 20 mg/dL (ref 5–40)

## 2017-07-29 ENCOUNTER — Ambulatory Visit: Payer: 59 | Admitting: Family Medicine

## 2017-09-01 ENCOUNTER — Encounter: Payer: Self-pay | Admitting: Internal Medicine

## 2017-10-11 ENCOUNTER — Other Ambulatory Visit: Payer: Self-pay | Admitting: Internal Medicine

## 2017-11-30 DIAGNOSIS — H5203 Hypermetropia, bilateral: Secondary | ICD-10-CM | POA: Diagnosis not present

## 2017-12-04 NOTE — Progress Notes (Deleted)
   [facility] Phone: [phone]   Date of Visit: 12/05/2017   HPI:  ***  ROS: See HPI.  PMFSH: ***  PHYSICAL EXAM: There were no vitals taken for this visit. Gen: *** HEENT: *** Heart: *** Lungs: *** Neuro: *** Ext: ***  ASSESSMENT/PLAN:  Health maintenance:  -***  No problem-specific Assessment & Plan notes found for this encounter.  FOLLOW UP: Follow up in *** for ***  Palma HolterKanishka G Gunadasa, MD PGY 2 [facility]

## 2017-12-05 ENCOUNTER — Ambulatory Visit: Payer: 59 | Admitting: Internal Medicine

## 2018-01-03 ENCOUNTER — Ambulatory Visit: Payer: 59 | Admitting: Internal Medicine

## 2018-01-06 NOTE — Progress Notes (Signed)
   [facility] Phone: [phone]   Date of Visit: 01/09/2018   HPI: In person interpreter used for this visit  Chronic abdominal pain: -Patient has a history of colonic perforation during a screening colonoscopy.  She had a colostomy that was reversed and her last surgery was in March 2016 per patient. -Since then she has had chronic abdominal pain on the left side.  Symptoms have been stable.  Pain is usually after or with eating.  Pain usually last about 1-2 hours.  She denies any nausea or vomiting.  Denies any blood in her stool. -She has a history of constipation.  She has a bowel movement every 3-4 days.  She has tried MiraLAX but this has caused her a lot of abdominal cramping. -Denies unintentional weight loss, fevers, chills, night sweats -At her last visit in July we did decide to make a referral to gastroenterology.  Reports that she has not heard from them.  Per chart review it looks like they have tried to get in touch with patient the phone number was not working for some reason.  ROS: See HPI.  PMFSH:  PMH: Abdominal Pain   PHYSICAL EXAM: BP 102/78   Pulse 63   Temp 98.3 F (36.8 C) (Oral)   Wt 79 lb (35.8 kg)   SpO2 92%   BMI 18.36 kg/m  GEN: NAD, petite female CV: RRR, no murmurs, rubs, or gallops PULM: CTAB, normal effort ABD: Soft, nondistended, scarring noted from prior surgeries.  Mild tenderness in the left side of the abdomen without guarding or rebound.  NABS, no organomegaly SKIN: No rash or cyanosis; warm and well-perfused EXTR: No lower extremity edema or calf tenderness PSYCH: Mood and affect euthymic, normal rate and volume of speech NEURO: Awake, alert, no focal deficits grossly, normal speech  ASSESSMENT/PLAN:  Chronic abdominal pain: Her symptoms are likely due to her multiple surgeries.  She likely has adhesions that might be contributing to her symptoms.  There are no signs or symptoms of bowel obstruction.  She is  unable to tolerate MiraLAX for her constipation due to abdominal cramping.  Will try just Colace 100 mg twice daily.  Discussed increasing water intake and fiber intake.  Will be referred to gastroenterology and I specifically placed her daughter's phone number and the comments so that the clinic will be able to reach family.  Follow-up: Patient wanted to discuss vaginal itching at the end of the visit.  Unfortunately we did not have time to discuss this in detail.  I have asked her to make a follow-up visit as soon as her schedule permits.  Palma HolterKanishka G Gunadasa, MD PGY 3 [facility]

## 2018-01-09 ENCOUNTER — Ambulatory Visit (INDEPENDENT_AMBULATORY_CARE_PROVIDER_SITE_OTHER): Payer: 59 | Admitting: Internal Medicine

## 2018-01-09 ENCOUNTER — Encounter: Payer: Self-pay | Admitting: Internal Medicine

## 2018-01-09 ENCOUNTER — Other Ambulatory Visit: Payer: Self-pay

## 2018-01-09 VITALS — BP 102/78 | HR 63 | Temp 98.3°F | Wt 79.0 lb

## 2018-01-09 DIAGNOSIS — R109 Unspecified abdominal pain: Secondary | ICD-10-CM | POA: Diagnosis not present

## 2018-01-09 DIAGNOSIS — G8929 Other chronic pain: Secondary | ICD-10-CM | POA: Diagnosis not present

## 2018-01-09 MED ORDER — DOCUSATE SODIUM 100 MG PO CAPS: 100.0000 mg | ORAL_CAPSULE | Freq: Two times a day (BID) | ORAL | 0 refills | Status: DC

## 2018-01-09 NOTE — Patient Instructions (Addendum)
Please take colace 1 tablet twice a day  I made another referral to the stomach doctors.   Please return soon to talk about your vaginal irritation.   Constipation, Adult Constipation is when a person:  Poops (has a bowel movement) fewer times in a week than normal.  Has a hard time pooping.  Has poop that is dry, hard, or bigger than normal.  Follow these instructions at home: Eating and drinking   Eat foods that have a lot of fiber, such as: ? Fresh fruits and vegetables. ? Whole grains. ? Beans.  Eat less of foods that are high in fat, low in fiber, or overly processed, such as: ? [place] fries. ? Hamburgers. ? Cookies. ? Candy. ? Soda.  Drink enough fluid to keep your pee (urine) clear or pale yellow. General instructions  Exercise regularly or as told by your doctor.  Go to the restroom when you feel like you need to poop. Do not hold it in.  Take over-the-counter and prescription medicines only as told by your doctor. These include any fiber supplements.  Do pelvic floor retraining exercises, such as: ? Doing deep breathing while relaxing your lower belly (abdomen). ? Relaxing your pelvic floor while pooping.  Watch your condition for any changes.  Keep all follow-up visits as told by your doctor. This is important. Contact a doctor if:  You have pain that gets worse.  You have a fever.  You have not pooped for 4 days.  You throw up (vomit).  You are not hungry.  You lose weight.  You are bleeding from the anus.  You have thin, pencil-like poop (stool). Get help right away if:  You have a fever, and your symptoms suddenly get worse.  You leak poop or have blood in your poop.  Your belly feels hard or bigger than normal (is bloated).  You have very bad belly pain.  You feel dizzy or you faint. This information is not intended to replace advice given to you by your health care provider. Make sure you discuss any questions you have with your  health care provider. Document Released: 04/26/2008 Document Revised: 05/28/2016 Document Reviewed: 04/28/2016 Elsevier Interactive Patient Education  2018 [employer]

## 2018-01-10 ENCOUNTER — Encounter: Payer: Self-pay | Admitting: Internal Medicine

## 2018-01-16 ENCOUNTER — Ambulatory Visit: Payer: 59 | Admitting: Internal Medicine

## 2018-02-22 ENCOUNTER — Other Ambulatory Visit: Payer: Self-pay

## 2018-02-22 ENCOUNTER — Ambulatory Visit: Payer: 59 | Admitting: Gastroenterology

## 2018-02-27 ENCOUNTER — Encounter: Payer: Self-pay | Admitting: Internal Medicine

## 2018-07-07 ENCOUNTER — Ambulatory Visit: Payer: 59 | Admitting: Family Medicine

## 2018-07-18 ENCOUNTER — Encounter: Payer: Self-pay | Admitting: Family Medicine

## 2018-07-18 ENCOUNTER — Other Ambulatory Visit: Payer: Self-pay

## 2018-07-18 ENCOUNTER — Ambulatory Visit (INDEPENDENT_AMBULATORY_CARE_PROVIDER_SITE_OTHER): Payer: 59 | Admitting: Family Medicine

## 2018-07-18 VITALS — BP 142/84 | HR 51 | Temp 98.0°F | Wt 74.0 lb

## 2018-07-18 DIAGNOSIS — R51 Headache: Secondary | ICD-10-CM | POA: Diagnosis not present

## 2018-07-18 DIAGNOSIS — K5900 Constipation, unspecified: Secondary | ICD-10-CM | POA: Diagnosis not present

## 2018-07-18 DIAGNOSIS — Z23 Encounter for immunization: Secondary | ICD-10-CM

## 2018-07-18 DIAGNOSIS — R519 Headache, unspecified: Secondary | ICD-10-CM

## 2018-07-18 DIAGNOSIS — G8929 Other chronic pain: Secondary | ICD-10-CM

## 2018-07-18 HISTORY — DX: Headache, unspecified: R51.9

## 2018-07-18 MED ORDER — IBUPROFEN 400 MG PO TABS: 400.0000 mg | ORAL_TABLET | Freq: Three times a day (TID) | ORAL | 0 refills | Status: DC | PRN

## 2018-07-18 MED ORDER — DOCUSATE SODIUM 100 MG PO CAPS: 100.0000 mg | ORAL_CAPSULE | Freq: Two times a day (BID) | ORAL | 0 refills | Status: DC

## 2018-07-18 NOTE — Progress Notes (Signed)
Subjective:     Patient ID: [name] [name], female   DOB: 1961/05/05, 57 y.o.   MRN: [id number]  Constipation  This is a chronic problem. The current episode started more than 1 month ago. The problem has been waxing and waning since onset. Her stool frequency is 2 to 3 times per week (With use of green tea she will move her bowel daily). Stool description: Hard stool, no blood in stool. The patient is on a high fiber diet. She exercises regularly. There has been adequate water intake. Associated symptoms include abdominal pain. Pertinent negatives include no anorexia, diarrhea, fever, melena, nausea or vomiting. Associated symptoms comments: Feels mild belly pain and bloating without constipation. Very mild pain now, she is not so concern.. Treatments tried: Green tie. Last used her Colace more than two months ago. Also used Miralax in the past. The treatment provided moderate relief. Her past medical history is significant for abdominal surgery.  Headache   This is a chronic (When she has constipation she has mild headache and feels nauseated) problem. The current episode started more than 1 month ago. The problem occurs intermittently. The problem has been waxing and waning. The pain is located in the bilateral and frontal region. The pain does not radiate. The pain quality is similar to prior headaches (Mostly related to her constipation). The quality of the pain is described as aching. The pain is at a severity of 3/10. The pain is mild. Associated symptoms include abdominal pain. Pertinent negatives include no anorexia, dizziness, fever, nausea, seizures, visual change or vomiting. Exacerbated by: Constipation. She has tried acetaminophen for the symptoms. The treatment provided significant relief. There is no history of cluster headaches or hypertension.   HM: Routine health care.    Current Outpatient Medications on File Prior to Visit  Medication Sig Dispense Refill  . acetaminophen (TYLENOL) 325 MG  tablet Take 2 tablets (650 mg total) by mouth every 6 (six) hours as needed for mild pain or moderate pain. 30 tablet 0  . docusate sodium (COLACE) 100 MG capsule Take 1 capsule (100 mg total) by mouth 2 (two) times daily. (Patient not taking: Reported on 07/18/2018) 60 capsule 0  . triamcinolone cream (KENALOG) 0.1 % apply to affected area twice a day for 1 week DONOT USE ON FACE,NECK OR SKIN FOLDS (Patient not taking: Reported on 07/18/2018) 30 g 0   No current facility-administered medications on file prior to visit.    Past Medical History:  Diagnosis Date  . Constipation   . Difficulty sleeping   . Literacy level of illiterate 10/01/2016   Less than one year of formal education in [place].  [name] [name] is taking classes for reading and writing in [place].   . S/P colostomy (HCC) 03/02/2015   March 2016: colon perforation following screening colonoscopy    Vitals:   07/18/18 1033  BP: (!) 142/84  Pulse: (!) 51  Temp: 98 F (36.7 C)  TempSrc: Oral  SpO2: 99%  Weight: 74 lb (33.6 kg)    Review of Systems  Constitutional: Negative for fever.  Respiratory: Negative.   Cardiovascular: Negative.   Gastrointestinal: Positive for abdominal pain and constipation. Negative for anorexia, diarrhea, melena, nausea and vomiting.  Genitourinary: Negative.   Neurological: Positive for headaches. Negative for dizziness and seizures.  All other systems reviewed and are negative.      Objective:   Physical Exam  Constitutional: She is oriented to person, place, and time. She appears well-developed. No distress.  Cardiovascular:  Normal rate, regular rhythm and normal heart sounds.  No murmur heard. Pulmonary/Chest: Effort normal and breath sounds normal. No stridor. No respiratory distress. She has no wheezes.  Abdominal: Soft. Bowel sounds are normal. She exhibits no distension and no mass. There is no tenderness.  Incision scars on her abdomen  Musculoskeletal: She exhibits no edema.   Neurological: She is alert and oriented to person, place, and time. She has normal strength. She displays normal reflexes. No cranial nerve deficit or sensory deficit. She displays a negative Romberg sign.  Nursing note and vitals reviewed.      Assessment:     Constipation Headache HM    Plan:     Chronic recurrent constipation. No acute change in symptoms. She requested script prescription for Colace. She will be traveling to [place] soon and needed refill., She stated she saw her GI specialist about 2 months ago. No follow-up scheduled for now. Improve fiber diet and consume adequate water. F/U with PCP soon.   For headache, currently asymptomatic. No neurologic deficit. Ibuprofen prescribed prn HA. ED precaution discussed.  HM: Flu shot given. Mammogram slip given and was ordered as well. F/U with PCP for chronic health issue

## 2018-07-18 NOTE — Patient Instructions (Signed)
   It was nice seeing you today. I am sorry about your constipation. Please continue Miralax and Colace as needed. See your gastroenterologist soon for follow-up. Use Ibuprofen prn for headache. Go to the ED if headache worsens.

## 2018-07-18 NOTE — Assessment & Plan Note (Signed)
For headache, currently asymptomatic. No neurologic deficit. Ibuprofen prescribed prn HA. ED precaution discussed.

## 2018-07-18 NOTE — Assessment & Plan Note (Signed)
Chronic recurrent constipation. No acute change in symptoms. She requested script prescription for Colace. She will be traveling to [place] soon and needed refill., She stated she saw her GI specialist about 2 months ago. No follow-up scheduled for now. Improve fiber diet and consume adequate water. F/U with PCP soon.

## 2018-11-20 ENCOUNTER — Ambulatory Visit (INDEPENDENT_AMBULATORY_CARE_PROVIDER_SITE_OTHER): Payer: 59 | Admitting: Family Medicine

## 2018-11-20 ENCOUNTER — Encounter: Payer: Self-pay | Admitting: Family Medicine

## 2018-11-20 ENCOUNTER — Other Ambulatory Visit: Payer: Self-pay

## 2018-11-20 VITALS — BP 121/70 | HR 74 | Temp 98.6°F | Ht <= 58 in | Wt 75.0 lb

## 2018-11-20 DIAGNOSIS — K5904 Chronic idiopathic constipation: Secondary | ICD-10-CM

## 2018-11-20 DIAGNOSIS — K625 Hemorrhage of anus and rectum: Secondary | ICD-10-CM | POA: Diagnosis not present

## 2018-11-20 MED ORDER — DOCUSATE SODIUM 100 MG PO CAPS: 100.0000 mg | ORAL_CAPSULE | Freq: Two times a day (BID) | ORAL | 1 refills | Status: DC

## 2018-11-20 NOTE — Assessment & Plan Note (Signed)
Patient still has issues with constipation since her perforated colon in 2016.  Patient regulates herself with colace and herbal tea and usually has at least one BM per day.  Patient went 2 days without BM after she ran out of colace and had BRBPR the following two days.  Refilled patient's colace.

## 2018-11-20 NOTE — Assessment & Plan Note (Signed)
Patient had two day history of BRBPR four days ago that resolved.  This was associated with prior two day history of constipation and she has a 3 year history of constipation and hard stool.  She has no signs of anemia.  She does not have external hemorrhoids on exam.  This is likely related to her constipation and the consistency of her stool which caused an anal fissure.  Have referred patient to GI to rule out th possibility of a more serious cause of rectal bleeding.  Colonscopy 3 years ago was completely benign.

## 2018-11-20 NOTE — Progress Notes (Signed)
   [facility] Phone: [phone]   cc: BRBPR, abdominal pain  Subjective:  Patient has had difficulty with constipation and abdominal pain since having a perforated colon in 2016 that required a colostomy for a period of time. She normally uses colace and herbal tea to regulate her bowel movements. She Ran out of colace on the 21st and stopped drinking her tea.  She Didn't have a bm on 23-25. On the 26th and 27th she drank the tea but has not had the colace since then.  On the 26th-27th She was having bright red blood that she could see in the toilet. The stools themselves did not blood in them.   She has No rectal pain, but is having buring sensation in the area of her surgery.  She feels a hot burnign sensation when she has a BM and if she eats too  Much she still has a burning sensation.  This has been a chronic problem since 2016.     ROS: See HPI for pertinent positives and negatives  Past Medical History  Family history reviewed for today's visit. No changes.   Objective: BP 121/70   Pulse 74   Temp 98.6 F (37 C) (Oral)   Ht 4\' 7"  (1.397 m)   Wt 75 lb (34 kg)   SpO2 98%   BMI 17.43 kg/m  Gen: NAD, alert and oriented, cooperative with exam HEENT: NCAT, EOMI, MMM CV: normal rate, regular rhythm. No murmurs, no rubs.  Resp: LCTAB, no wheezes, crackles. normal work of breathing GI: patient has a midline surgical scar below her navel that is tender to palpation.  She does not have any external hemorrhoids. BS present, no guarding or organomegaly Msk: No edema, warm, normal tone, moves UE/LE spontaneously Neuro: CN II-XII grossly intact. no gross deficits Skin: No rashes, no lesions Psych: Appropriate behavior  Assessment/Plan: BRBPR (bright red blood per rectum) Patient had two day history of BRBPR four days ago that resolved.  This was associated with prior two day history of constipation and she has a 3 year history of constipation and hard stool.   She has no signs of anemia.  She does not have external hemorrhoids on exam.  This is likely related to her constipation and the consistency of her stool which caused an anal fissure.  Have referred patient to GI to rule out th possibility of a more serious cause of rectal bleeding.  Colonscopy 3 years ago was completely benign.   Constipation Patient still has issues with constipation since her perforated colon in 2016.  Patient regulates herself with colace and herbal tea and usually has at least one BM per day.  Patient went 2 days without BM after she ran out of colace and had BRBPR the following two days.  Refilled patient's colace.      Frederic Jerichoan Rhyse Skowron, MD PGY-1

## 2018-11-20 NOTE — Patient Instructions (Signed)
I have refilled your stool softener.  Your bleeding was probably related to your constipation but I have sent a referral to the GI doctors so they can examine you.  Someone will call you to set up the appointment.  If you continue to have bleeding please make another appointment.  If you start to feel weak or dizzy you should also make an appointment with us to make sure you are not losing too much blood.    Have a great day,   Frederic Jerichoan Kavontae Pritchard, MD.

## 2019-01-09 ENCOUNTER — Encounter: Payer: Self-pay | Admitting: Family Medicine

## 2019-06-06 ENCOUNTER — Other Ambulatory Visit: Payer: Self-pay

## 2019-06-06 ENCOUNTER — Encounter: Payer: Self-pay | Admitting: Family Medicine

## 2019-06-06 ENCOUNTER — Ambulatory Visit (INDEPENDENT_AMBULATORY_CARE_PROVIDER_SITE_OTHER): Payer: 59 | Admitting: Family Medicine

## 2019-06-06 VITALS — BP 110/74 | HR 82 | Ht <= 58 in | Wt 74.1 lb

## 2019-06-06 DIAGNOSIS — W57XXXA Bitten or stung by nonvenomous insect and other nonvenomous arthropods, initial encounter: Secondary | ICD-10-CM | POA: Insufficient documentation

## 2019-06-06 DIAGNOSIS — M545 Low back pain, unspecified: Secondary | ICD-10-CM

## 2019-06-06 DIAGNOSIS — Z0000 Encounter for general adult medical examination without abnormal findings: Secondary | ICD-10-CM

## 2019-06-06 MED ORDER — HYDROCORTISONE 2 % EX LOTN: 1.0000 "application " | TOPICAL_LOTION | Freq: Two times a day (BID) | CUTANEOUS | 0 refills | Status: DC | PRN

## 2019-06-06 MED ORDER — LORATADINE 10 MG PO TABS: 10.0000 mg | ORAL_TABLET | Freq: Every day | ORAL | 11 refills | Status: DC

## 2019-06-06 NOTE — Progress Notes (Signed)
Date of Visit: 06/06/2019   HPI:  Patient presents today for a well woman exam.   Concerns today: rash and low back pain after falling out of hammock Rash: -Patient reports that she has had a rash that started this summer.  She has small bumps that are very itchy and come and go.  She states that she has tried an anti-itch cream on it which has not really helped her.  She reports that while during the day she covers her skin entirely to work in her garden she often sits on her back porch at night.  She sleeps in the same bed with her husband who does not have a rash.  She denies any new fevers, chills, change in medication.  Low back pain: Patient reports that she fell out of her hammock a few days ago.  She reports that she fell on her low back.  Since then she has had some low back pain.  The back pain is improved with Tylenol.  She is also been using salon pas which has helped.  Patient reports denies any weakness, numbness, tingling, change in gait.  She states that she is still able to perform her daily activities it is just a little bit painful. Exercise: Works in her garden Diet: Tries to eat healthy Smoking: Smokes but is not interested in quitting Alcohol: Does not drink Drugs: Does not use any illicit drugs Mood: Good mood  ROS: See HPI  PHYSICAL EXAM: BP 110/74   Pulse 82   Ht 4\' 7"  (1.397 m)   Wt 74 lb 2 oz (33.6 kg)   SpO2 98%   BMI 17.23 kg/m  Gen: NAD, pleasant, cooperative HEENT: NCAT, PERRL, no palpable thyromegaly or anterior cervical lymphadenopathy Heart: RRR, no murmurs Lungs: CTAB, NWOB Abdomen: soft, nontender to palpation Neuro: grossly nonfocal, speech normal GU: normal appearing external genitalia without lesions. Vagina is moist with white discharge. Cervix normal in appearance. No cervical motion tenderness or tenderness on bimanual exam. No adnexal masses.   ASSESSMENT/PLAN:  # Health maintenance:  - CBC, CMP -mammogram:ordered today -lipid  screening:done today -DEXA: previously ordered, but patient never had completed, will place order again -handout given on health maintenance topics  # Mosquito bites: Educated on preventing mosquito bites with covering exposed skin and DEET products. Provided Claritin and a steroid cream in order to help prevent itching.    #Low back pain may continue to use salon pas as needed. Use Tylenol and ibuprofen as needed.  Continue to try heating pad on the area.  If it does not resolve in the next few weeks and patient to return to the office for further evaluation.  FOLLOW UP: Follow up in 1 year or sooner as needed  [place] Vaniyah Lansky, DO PGY-3, [place]

## 2019-06-06 NOTE — Patient Instructions (Addendum)
Thank you for coming to see me today. It was a pleasure! Today we talked about:   To prevent mosquito bites you may use DEET products.  I have sent Claritin and a steroid cream in order to help prevent your itching.  You may take the Claritin once daily.  For your back pain you may continue to use salon pas as needed.  You may also use Tylenol and ibuprofen as needed.  Continue to try heating pad on the area.  If it does not resolve in the next few weeks and please return to the office for further evaluation.  We will call you with your lab results.  Please follow-up with me in 1 year or sooner as needed.  If you have any questions or concerns, please do not hesitate to call the office at [phone].  Take Care,   [place] Febe Champa, DO  Insect Bite, Adult An insect bite can make your skin red, itchy, and swollen. Some insects can spread disease to people with a bite. However, most insect bites do not lead to disease, and most are not serious. What are the causes? Insects may bite for many reasons, including:  Hunger.  To defend themselves. Insects that bite include:  Spiders.  Mosquitoes.  Ticks.  Fleas.  Ants.  Flies.  Kissing bugs.  Chiggers. What are the signs or symptoms? Symptoms of this condition include:  Itching or pain in the bite area.  Redness and swelling in the bite area.  An open wound (skin ulcer). Symptoms often last for 2-4 days. In rare cases, a person may have a very bad allergic reaction (anaphylactic reaction) to a bite. Symptoms of an anaphylactic reaction may include:  Feeling warm in the face (flushed). Your face may turn red.  Itchy, red, swollen areas of skin (hives).  Swelling of the: ? Eyes. ? Lips. ? Face. ? Mouth. ? Tongue. ? Throat.  Trouble with any of these: ? Breathing. ? Talking. ? Swallowing.  Loud breathing (wheezing).  Feeling dizzy or light-headed.  Passing out (fainting).  Pain or cramps in your  belly.  Throwing up (vomiting).  Watery poop (diarrhea). How is this treated? Treatment is usually not needed. Symptoms often go away on their own. When treatment is needed, it may involve:  Putting a cream or lotion on the bite area. This helps with itching.  Taking an antibiotic medicine. This treatment is needed if the bite area gets infected.  Getting a tetanus shot, if you are not up to date on this vaccine.  Putting ice on the affected area.  Using medicines called antihistamines. This treatment may be needed if you have itching or an allergic reaction to the insect bite.  Giving yourself a shot of medicine (epinephrine) using an auto-injector "pen" if you have an anaphylactic reaction to a bite. Your doctor will teach you how to use this pen. Follow these instructions at home: Bite area care   Do not scratch the bite area.  Keep the bite area clean and dry.  Wash the bite area every day with soap and water as told by your doctor.  Check the bite area every day for signs of infection. Check for: ? Redness, swelling, or pain. ? Fluid or blood. ? Warmth. ? Pus or a bad smell. Managing pain, itching, and swelling   You may put any of these on the bite area as told by your doctor: ? A paste made of baking soda and water. ? Cortisone cream. ?  Calamine lotion.  If told, put ice on the bite area. ? Put ice in a plastic bag. ? Place a towel between your skin and the bag. ? Leave the ice on for 20 minutes, 2-3 times a day. General instructions  Apply or take over-the-counter and prescription medicines only as told by your doctor.  If you were prescribed an antibiotic medicine, take or apply it as told by your doctor. Do not stop using the antibiotic even if your condition improves.  Keep all follow-up visits as told by your doctor. This is important. How is this prevented? To help you have a lower risk of insect bites:  When you are outside, wear clothing that  covers your arms and legs.  Use insect repellent. The best insect repellents contain one of these: ? DEET. ? Picaridin. ? Oil of lemon eucalyptus (OLE). ? IR3535.  Consider spraying your clothing with a pesticide called permethrin. Permethrin helps prevent insect bites. It works for several weeks and for up to 5-6 clothing washes. Do not apply permethrin directly to the skin.  If your home windows do not have screens, think about putting some in.  If you will be sleeping in an area where there are mosquitoes, consider covering your sleeping area with a mosquito net. Contact a doctor if:  You have redness, swelling, or pain in the bite area.  You have fluid or blood coming from the bite area.  The bite area feels warm to the touch.  You have pus or a bad smell coming from the bite area.  You have a fever. Get help right away if:  You have joint pain.  You have a rash.  You feel more tired or sleepy than you normally do.  You have neck pain.  You have a headache.  You feel weaker than you normally do.  You have signs of an anaphylactic reaction. Signs may include: ? Feeling warm in the face. ? Itchy, red, swollen areas of skin. ? Swelling of your:  Eyes.  Lips.  Face.  Mouth.  Tongue.  Throat. ? Trouble with any of these:  Breathing.  Talking.  Swallowing. ? Loud breathing. ? Feeling dizzy or light-headed. ? Passing out. ? Pain or cramps in your belly. ? Throwing up. ? Watery poop. These symptoms may be an emergency. Do not wait to see if the symptoms will go away. Do this right away:  Use your auto-injector pen as you have been told.  Get medical help. Call your local emergency services (911 in the [place] Do not drive yourself to the hospital. Summary  An insect bite can make your skin red, itchy, and swollen.  Treatment is usually not needed. Symptoms often go away on their own.  Do not scratch the bite area. Keep it clean and dry.  Ice  can help with pain and itching from the bite. This information is not intended to replace advice given to you by your health care provider. Make sure you discuss any questions you have with your health care provider. Document Released: 11/05/2000 Document Revised: 05/19/2018 Document Reviewed: 05/19/2018 Elsevier Patient Education  2020 [employer]

## 2019-06-07 LAB — CBC
Hematocrit: 37.1 % (ref 34.0–46.6)
Hemoglobin: 11.2 g/dL (ref 11.1–15.9)
MCH: 22.8 pg — ABNORMAL LOW (ref 26.6–33.0)
MCHC: 30.2 g/dL — ABNORMAL LOW (ref 31.5–35.7)
MCV: 75 fL — ABNORMAL LOW (ref 79–97)
Platelets: 222 10*3/uL (ref 150–450)
RBC: 4.92 x10E6/uL (ref 3.77–5.28)
RDW: 15.6 % — ABNORMAL HIGH (ref 11.7–15.4)
WBC: 6.1 10*3/uL (ref 3.4–10.8)

## 2019-06-07 LAB — COMPREHENSIVE METABOLIC PANEL
ALT: 11 IU/L (ref 0–32)
AST: 21 IU/L (ref 0–40)
Albumin/Globulin Ratio: 1.7 (ref 1.2–2.2)
Albumin: 4.5 g/dL (ref 3.8–4.9)
Alkaline Phosphatase: 113 IU/L (ref 39–117)
BUN/Creatinine Ratio: 13 (ref 9–23)
BUN: 8 mg/dL (ref 6–24)
Bilirubin Total: 0.4 mg/dL (ref 0.0–1.2)
CO2: 24 mmol/L (ref 20–29)
Calcium: 9.8 mg/dL (ref 8.7–10.2)
Chloride: 100 mmol/L (ref 96–106)
Creatinine, Ser: 0.64 mg/dL (ref 0.57–1.00)
GFR calc Af Amer: 115 mL/min/{1.73_m2} (ref >59)
GFR calc non Af Amer: 99 mL/min/{1.73_m2} (ref >59)
Globulin, Total: 2.7 g/dL (ref 1.5–4.5)
Glucose: 75 mg/dL (ref 65–99)
Potassium: 5.1 mmol/L (ref 3.5–5.2)
Sodium: 140 mmol/L (ref 134–144)
Total Protein: 7.2 g/dL (ref 6.0–8.5)

## 2019-06-07 LAB — LIPID PANEL
Chol/HDL Ratio: 3 ratio (ref 0.0–4.4)
Cholesterol, Total: 148 mg/dL (ref 100–199)
HDL: 50 mg/dL (ref >39)
LDL Calculated: 74 mg/dL (ref 0–99)
Triglycerides: 119 mg/dL (ref 0–149)
VLDL Cholesterol Cal: 24 mg/dL (ref 5–40)

## 2019-06-12 ENCOUNTER — Encounter: Payer: Self-pay | Admitting: Family Medicine

## 2019-06-27 ENCOUNTER — Other Ambulatory Visit: Payer: Self-pay | Admitting: Family Medicine

## 2019-06-27 DIAGNOSIS — Z13820 Encounter for screening for osteoporosis: Secondary | ICD-10-CM

## 2019-07-02 ENCOUNTER — Telehealth (INDEPENDENT_AMBULATORY_CARE_PROVIDER_SITE_OTHER): Payer: 59 | Admitting: Family Medicine

## 2019-07-02 ENCOUNTER — Other Ambulatory Visit: Payer: Self-pay

## 2019-07-02 DIAGNOSIS — Z20822 Contact with and (suspected) exposure to covid-19: Secondary | ICD-10-CM

## 2019-07-02 DIAGNOSIS — Z20828 Contact with and (suspected) exposure to other viral communicable diseases: Secondary | ICD-10-CM | POA: Diagnosis not present

## 2019-07-02 NOTE — Progress Notes (Signed)
[facility] Telemedicine Visit  Patient consented to have virtual visit. Method of visit: Telephone  Encounter participants: Patient: [name] [name] - located at home Provider: Bonnita Hollow - located at office Others (if applicable): Daughter, who acted as interpreter. Professional interpretive services were offered, but family declined.   Chief Complaint: Exposure to COVID-19  HPI: [name] [name] is a 58 y.o. female who presents with exposure to COVID-19 to a brother who is now hospitalized approximate 2 weeks ago and her husband who tested positive approximately 5 days ago.  The husband has been asymptomatic.  Patient is also asymptomatic and denies shortness of breath, fevers, chills, cough, congestion, rash, diarrhea.  Patient is tolerating p.o. well.  Patient is requesting testing.  ROS: per HPI  Pertinent PMHx: Tobacco use  Exam:  Unable to assess the spoke with daughter over the phone  Assessment/Plan: Close exposure to COVID-19 Patient had close exposure to brother and husband who both tested COVID-19 positive.  Patient is currently asymptomatic.  Given length of time patient has remained asymptomatic, is likely to remain asymptomatic.  But given close exposure, to assist with contact tracing, will obtain COVID-19 testing.  Recommend patient self quarantine until negative test result.  Directions provided to patient for location of testing center.  Time spent during visit with patient: 6 minutes

## 2019-07-03 ENCOUNTER — Other Ambulatory Visit: Payer: Self-pay

## 2019-07-03 DIAGNOSIS — Z20822 Contact with and (suspected) exposure to covid-19: Secondary | ICD-10-CM

## 2019-07-04 LAB — NOVEL CORONAVIRUS, NAA: SARS-CoV-2, NAA: NOT DETECTED

## 2019-11-07 ENCOUNTER — Other Ambulatory Visit: Payer: Self-pay

## 2019-11-07 ENCOUNTER — Encounter: Payer: Self-pay | Admitting: Family Medicine

## 2019-11-07 ENCOUNTER — Ambulatory Visit (INDEPENDENT_AMBULATORY_CARE_PROVIDER_SITE_OTHER): Payer: 59 | Admitting: Family Medicine

## 2019-11-07 VITALS — BP 120/60 | HR 70 | Ht <= 58 in | Wt 75.1 lb

## 2019-11-07 DIAGNOSIS — M545 Low back pain, unspecified: Secondary | ICD-10-CM

## 2019-11-07 DIAGNOSIS — R519 Headache, unspecified: Secondary | ICD-10-CM

## 2019-11-07 DIAGNOSIS — G8929 Other chronic pain: Secondary | ICD-10-CM | POA: Diagnosis not present

## 2019-11-07 DIAGNOSIS — K5904 Chronic idiopathic constipation: Secondary | ICD-10-CM

## 2019-11-07 DIAGNOSIS — Z23 Encounter for immunization: Secondary | ICD-10-CM

## 2019-11-07 DIAGNOSIS — F17200 Nicotine dependence, unspecified, uncomplicated: Secondary | ICD-10-CM

## 2019-11-07 DIAGNOSIS — Z1231 Encounter for screening mammogram for malignant neoplasm of breast: Secondary | ICD-10-CM

## 2019-11-07 MED ORDER — IBUPROFEN 600 MG PO TABS: 600.0000 mg | ORAL_TABLET | Freq: Three times a day (TID) | ORAL | 0 refills | Status: DC | PRN

## 2019-11-07 MED ORDER — POLYETHYLENE GLYCOL 3350 17 GM/SCOOP PO POWD: 17.0000 g | ORAL | 1 refills | Status: DC

## 2019-11-07 NOTE — Patient Instructions (Addendum)
Thank you for coming to see me today. It was a pleasure! Today we talked about:   For your low back pain we will get an x-ray. You can walk-in to [facility] for imaging in order to have this done. We will call you with these results.  For the pain I have sent in a prescription of ibuprofen that you can take every 8 hours as needed for pain.    For your headaches I recommend that you start taking the Claritin daily.  You may also use Mucinex or guaifenesin to see if this helps with your headaches. The ibuprofen may also help.  If your headache worsens or does not start to improve then please do not hesitate to return to the office.  For your constipation I recommend starting the MiraLAX every other day.  You can also try fiber supplements such as benefiber.   You are due for your mammogram and DEXA (bone density) scan.   Please follow-up with our immigrant clinic on January 12 to see if you are better as they have longer appointments or sooner as needed.  If you have any questions or concerns, please do not hesitate to call the office at [phone].  Take Care,   [place] Thimothy Barretta, DO Fiber Content in Foods  See the following list for the dietary fiber content of some common foods. High-fiber foods High-fiber foods contain 4 grams or more (4g or more) of fiber per serving. They include:  Artichoke (fresh) -- 1 medium has 10.3g of fiber.  Baked beans, plain or vegetarian (canned) --  cup has 5.2g of fiber.  Blackberries or raspberries (fresh) --  cup has 4g of fiber.  Bran cereal --  cup has 8.6g of fiber.  Bulgur (cooked) --  cup has 4g of fiber.  Kidney beans (canned) --  cup has 6.8g of fiber.  Lentils (cooked) --  cup has 7.8g of fiber.  Pear (fresh) -- 1 medium has 5.1g of fiber.  Peas (frozen) --  cup has 4.4g of fiber.  Pinto beans (canned) --  cup has 5.5g of fiber.  Pinto beans (dried and cooked) --  cup has 7.7g of fiber.  Potato with skin (baked) -- 1 medium  has 4.4g of fiber.  Quinoa (cooked) --  cup has 5g of fiber.  Soybeans (canned, frozen, or fresh) --  cup has 5.1g of fiber. Moderate-fiber foods Moderate-fiber foods contain 1-4 grams (1-4g) of fiber per serving. They include:  Almonds -- 1 oz. has 3.5g of fiber.  Apple with skin -- 1 medium has 3.3g of fiber.  Applesauce, sweetened --  cup has 1.5g of fiber.  Bagel, plain -- one 4-inch (10-cm) bagel has 2g of fiber.  Banana -- 1 medium has 3.1g of fiber.  Broccoli (cooked) --  cup has 2.5g of fiber.  Carrots (cooked) --  cup has 2.3g of fiber.  Corn (canned or frozen) --  cup has 2.1g of fiber.  Corn tortilla -- one 6-inch (15-cm) tortilla has 1.5g of fiber.  Green beans (canned) --  cup has 2g of fiber.  Instant oatmeal --  cup has about 2g of fiber.  Long-grain brown rice (cooked) -- 1 cup has 3.5g of fiber.  Macaroni, enriched (cooked) -- 1 cup has 2.5g of fiber.  Melon -- 1 cup has 1.4g of fiber.  Multigrain cereal --  cup has about 2-4g of fiber.  Orange -- 1 small has 3.1g of fiber.  Potatoes, mashed --  cup has 1.6g  of fiber.  Raisins -- 1/4 cup has 1.6g of fiber.  Squash --  cup has 2.9g of fiber.  Sunflower seeds --  cup has 1.1g of fiber.  Tomato -- 1 medium has 1.5g of fiber.  Vegetable or soy patty -- 1 has 3.4g of fiber.  Whole-wheat bread -- 1 slice has 2g of fiber.  Whole-wheat spaghetti --  cup has 3.2g of fiber. Low-fiber foods Low-fiber foods contain less than 1 gram (less than 1g) of fiber per serving. They include:  Egg -- 1 large.  Flour tortilla -- one 6-inch (15-cm) tortilla.  Fruit juice --  cup.  Lettuce -- 1 cup.  Meat, poultry, or fish -- 1 oz.  Milk -- 1 cup.  Spinach (raw) -- 1 cup.  White bread -- 1 slice.  White rice --  cup.  Yogurt --  cup. Actual amounts of fiber in foods may be different depending on processing. Talk with your dietitian about how much fiber you need in your diet. This  information is not intended to replace advice given to you by your health care provider. Make sure you discuss any questions you have with your health care provider. Document Released: 03/27/2007 Document Revised: 07/01/2016 Document Reviewed: 01/01/2016 Elsevier Patient Education  2020 [employer].

## 2019-11-07 NOTE — Progress Notes (Signed)
Subjective:  Patient ID: [name] [name]  DOB: 07-Jul-1961 MRN: [id number]  [name] [name] is a 58 y.o. female with a PMH of s/p colostomy in 2016 following colon perforation during regular screening, here today for low back pain, constipation, headaches.   HPI:  Low back pain: Patient reports that her back pain has been present for 1 month.  She states it hurts more when she is bending or sitting down.  Back in July she fell out of her hammock and was seen in the office but reports that that pain completely resolved prior to this new onset pain.  She reports that the pain does not radiate anywhere and is just located in the center of her low back. She denies any new trauma. She has not tried anything for the pain. Patient denies any weakness, numbness, tingling, change in gait.  She states that she is still able to perform her daily activities it is just a little bit painful.  Headache: Patient complaining of headache for 2 month(s). Description of pain: sharp pain. Duration of individual headaches: a few minute(s), frequency daily. Associated symptoms: facial pain. Pain relief: relieved with OTC medication from [facility] but patient unsure what it is. . Precipitating factors: patient is aware of none. She denies a history of recent head injury. She denies fevers, cough, aura, N/V, vision change  Prior neurological history: negative for no neurological problems.  Abdominal pain/constipation -Patient states that she has a chronic problem with constipation.  She reports that she takes Colace and drinks green tea every day.   --Patient has a history of colonic perforation during a screening colonoscopy. She had a colostomy that was reversed and her last surgery was in March 2016 per patient. She states that since then she has had on and off troubles with constipation. She has not followed up with a GI doctor since -She denies any blood in her stool.  She denies any dark stool.   -She states that she has hard stools  about every 2 to 3 days and has to strain.   -She states that when she used MiraLAX in the past it made her have diarrhea and then she would go back to having constipation when she stopped taking it.  States she has tried Dulcolax but that caused cramping. -She denies any associated abdominal pain with her bowel movements.  States that her abdominal pain is over the scar site from her previous surgeries.  She states that it has pain occasionally and she is wondering what would be causing this. -She denies any fevers, chills  Tobacco use disorder Patient reports that she smokes 4 cigarettes/day but is not currently interested in quitting.  ROS: As mentioned in HPI  Social hx: Denies use of illicit drugs, alcohol use Smoking status reviewed  Patient Active Problem List   Diagnosis Date Noted  . Low back pain 11/08/2019  . Headache 07/18/2018  . Literacy level of illiterate 10/01/2016  . Reduced visual acuity 09/30/2016  . Absolute anemia 07/27/2016  . Esophageal reflux 06/23/2016  . Memory difficulties 06/23/2016  . Insomnia 04/29/2016  . Abdominal pain 01/15/2016  . 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 S/P colostomy (HCC) 03/02/2015  . Constipation 12/19/2014  . Neuropathy of foot 12/10/2013  . TOBACCO DEPENDENCE 01/19/2007     Objective:  BP 120/60   Pulse 70   Ht 4\' 7"  (1.397 m)   Wt 75 lb 2 oz (34.1 kg)   SpO2 98%   BMI 17.46 kg/m  Vitals and nursing note reviewed  General: NAD, pleasant Pulm: normal effort, CTAB Cardiac: RRR, normal heart sounds, no m/r/g GI: soft, nontender, nondistended, multiple scars on abdomen with no hernias appreciated on exam, normoactive bowel sounds x4 Extremities: no edema or cyanosis. WWP. Normal gait.  Strength 5/5 in BLLE Low back: No midline tenderness appreciated, no skin changes appreciated, patient with FROM of low back Skin: warm and dry, no rashes noted Neuro: alert and oriented, no focal deficits Psych: normal affect,  normal thought content  Assessment & Plan:   TOBACCO DEPENDENCE Patient smokes 4 cigarettes per day and is not currently interested in aid in quitting.   Constipation Patient still having issues with her constipation since her colostomy in 2016.  She takes Colace daily along with green tea which she believes helps her.  Patient previously was unable to tolerate Dulcolax due to cramping.  Patient also reports that MiraLAX caused her to have diarrhea and then she would have constipation which she stopped.  Likely that she was taking too much of the MiraLAX when gathering history.  Will trial using MiraLAX every other day and encourage patient to increase fiber in her diet.  Given handout on high-fiber meal.  If patient has continued problems would recommend that she follow-up with GI given her history.  Low back pain Patient presenting with low back pain that happened about a month ago.  No red flag symptoms.  Given that in July patient had fall from hammock and was lost to follow-up - will obtain DG lumbar in order to rule out any sort of fracture.   -Patient has not been doing anything for her pain currently.  Advised her to try alternating Tylenol and ibuprofen along with heating pads.   -She is to return if her pain does not improve in a few weeks and ED return precautions discussed.   -Did also schedule patient for follow-up with immigrant clinic in January so that she may have more time to discuss her chronic problems and follow-up with her back pain  Headache Patient with headache on and off for 2 months.  No neurologic deficit noted.  No focal findings on exam.  Patient has not tried any medications other than something she is getting from [facility] over-the-counter but she has unsure what it is.  Differential includes tension type headache vs sinusitis given facial tenderness on exam. No need for antibiotics given lack of other symptoms. - Advised patient to start taking her Claritin daily.  Also may try mucinex - Also given ibuprofen for her back pain which may help improve her headaches. -She is to return if her symptoms do not improve or worsen.    Health maintenance Placed order for screening mammogram and patient also to schedule DEXA scan.  Given handout and encouraged patient to go have this done.  [place] Xavia Kniskern, DO Family Medicine Resident PGY-3

## 2019-11-08 DIAGNOSIS — M545 Low back pain, unspecified: Secondary | ICD-10-CM | POA: Insufficient documentation

## 2019-11-08 NOTE — Assessment & Plan Note (Signed)
Patient still having issues with her constipation since her colostomy in 2016.  She takes Colace daily along with green tea which she believes helps her.  Patient previously was unable to tolerate Dulcolax due to cramping.  Patient also reports that MiraLAX caused her to have diarrhea and then she would have constipation which she stopped.  Likely that she was taking too much of the MiraLAX when gathering history.  Will trial using MiraLAX every other day and encourage patient to increase fiber in her diet.  Given handout on high-fiber meal.  If patient has continued problems would recommend that she follow-up with GI given her history.

## 2019-11-08 NOTE — Assessment & Plan Note (Signed)
Patient smokes 4 cigarettes per day and is not currently interested in aid in quitting.

## 2019-11-08 NOTE — Assessment & Plan Note (Addendum)
Patient presenting with low back pain that happened about a month ago.  No red flag symptoms.  Given that in July patient had fall from hammock and was lost to follow-up - will obtain DG lumbar in order to rule out any sort of fracture.   -Patient has not been doing anything for her pain currently.  Advised her to try alternating Tylenol and ibuprofen along with heating pads.   -She is to return if her pain does not improve in a few weeks and ED return precautions discussed.   -Did also schedule patient for follow-up with immigrant clinic in January so that she may have more time to discuss her chronic problems and follow-up with her back pain

## 2019-11-08 NOTE — Assessment & Plan Note (Signed)
Patient with headache on and off for 2 months.  No neurologic deficit noted.  No focal findings on exam.  Patient has not tried any medications other than something she is getting from [facility] over-the-counter but she has unsure what it is.  Differential includes tension type headache vs sinusitis given facial tenderness on exam. No need for antibiotics given lack of other symptoms. - Advised patient to start taking her Claritin daily. Also may try mucinex - Also given ibuprofen for her back pain which may help improve her headaches. -She is to return if her symptoms do not improve or worsen.

## 2019-12-04 ENCOUNTER — Ambulatory Visit: Payer: 59

## 2020-01-29 ENCOUNTER — Emergency Department (HOSPITAL_COMMUNITY): Payer: 59

## 2020-01-29 ENCOUNTER — Other Ambulatory Visit: Payer: Self-pay

## 2020-01-29 ENCOUNTER — Emergency Department (HOSPITAL_COMMUNITY)
Admission: EM | Admit: 2020-01-29 | Discharge: 2020-01-29 | Disposition: A | Discharge status: Home / Self Care | Payer: 59 | Attending: Emergency Medicine | Admitting: Emergency Medicine

## 2020-01-29 ENCOUNTER — Encounter (HOSPITAL_COMMUNITY): Payer: Self-pay

## 2020-01-29 DIAGNOSIS — F17210 Nicotine dependence, cigarettes, uncomplicated: Secondary | ICD-10-CM | POA: Insufficient documentation

## 2020-01-29 DIAGNOSIS — E876 Hypokalemia: Secondary | ICD-10-CM | POA: Diagnosis not present

## 2020-01-29 DIAGNOSIS — R079 Chest pain, unspecified: Secondary | ICD-10-CM | POA: Diagnosis not present

## 2020-01-29 DIAGNOSIS — Y92239 Unspecified place in hospital as the place of occurrence of the external cause: Secondary | ICD-10-CM | POA: Diagnosis not present

## 2020-01-29 DIAGNOSIS — Y990 Civilian activity done for income or pay: Secondary | ICD-10-CM | POA: Diagnosis not present

## 2020-01-29 DIAGNOSIS — Y939 Activity, unspecified: Secondary | ICD-10-CM | POA: Insufficient documentation

## 2020-01-29 DIAGNOSIS — S29011A Strain of muscle and tendon of front wall of thorax, initial encounter: Secondary | ICD-10-CM | POA: Diagnosis not present

## 2020-01-29 DIAGNOSIS — R0789 Other chest pain: Secondary | ICD-10-CM | POA: Diagnosis not present

## 2020-01-29 DIAGNOSIS — X500XXA Overexertion from strenuous movement or load, initial encounter: Secondary | ICD-10-CM | POA: Diagnosis not present

## 2020-01-29 LAB — BASIC METABOLIC PANEL
Anion gap: 9 (ref 5–15)
BUN: 5 mg/dL — ABNORMAL LOW (ref 6–20)
CO2: 28 mmol/L (ref 22–32)
Calcium: 9.3 mg/dL (ref 8.9–10.3)
Chloride: 104 mmol/L (ref 98–111)
Creatinine, Ser: 0.6 mg/dL (ref 0.44–1.00)
GFR calc Af Amer: >60 mL/min (ref >60)
GFR calc non Af Amer: >60 mL/min (ref >60)
Glucose, Bld: 95 mg/dL (ref 70–99)
Potassium: 2.8 mmol/L — ABNORMAL LOW (ref 3.5–5.1)
Sodium: 141 mmol/L (ref 135–145)

## 2020-01-29 LAB — CBC
HCT: 34.1 % — ABNORMAL LOW (ref 36.0–46.0)
Hemoglobin: 10.6 g/dL — ABNORMAL LOW (ref 12.0–15.0)
MCH: 21.8 pg — ABNORMAL LOW (ref 26.0–34.0)
MCHC: 31.1 g/dL (ref 30.0–36.0)
MCV: 70.2 fL — ABNORMAL LOW (ref 80.0–100.0)
Platelets: 225 10*3/uL (ref 150–400)
RBC: 4.86 MIL/uL (ref 3.87–5.11)
RDW: 13.3 % (ref 11.5–15.5)
WBC: 7 10*3/uL (ref 4.0–10.5)
nRBC: 0 % (ref 0.0–0.2)

## 2020-01-29 LAB — TROPONIN I (HIGH SENSITIVITY)
Troponin I (High Sensitivity): <2 ng/L (ref <18)
Troponin I (High Sensitivity): 2 ng/L (ref <18)

## 2020-01-29 LAB — MAGNESIUM: Magnesium: 1.9 mg/dL (ref 1.7–2.4)

## 2020-01-29 LAB — I-STAT BETA HCG BLOOD, ED (MC, WL, AP ONLY): I-stat hCG, quantitative: <5 m[IU]/mL (ref <5)

## 2020-01-29 MED ORDER — ACETAMINOPHEN 325 MG PO TABS
650.0000 mg | ORAL_TABLET | Freq: Once | ORAL | Status: AC
  Administered 2020-01-29: 650 mg via ORAL
  Filled 2020-01-29: qty 2

## 2020-01-29 MED ORDER — POTASSIUM CHLORIDE 10 MEQ/100ML IV SOLN
10.0000 meq | INTRAVENOUS | Status: AC
  Administered 2020-01-29 (×2): 10 meq via INTRAVENOUS
  Filled 2020-01-29 (×2): qty 100

## 2020-01-29 MED ORDER — POTASSIUM CHLORIDE CRYS ER 20 MEQ PO TBCR
40.0000 meq | EXTENDED_RELEASE_TABLET | Freq: Once | ORAL | Status: AC
  Administered 2020-01-29: 17:00:00 40 meq via ORAL
  Filled 2020-01-29: qty 2

## 2020-01-29 MED ORDER — METHOCARBAMOL 500 MG PO TABS: 500.0000 mg | ORAL_TABLET | Freq: Two times a day (BID) | ORAL | 0 refills | Status: DC | PRN

## 2020-01-29 MED ORDER — METHOCARBAMOL 500 MG PO TABS
500.0000 mg | ORAL_TABLET | Freq: Once | ORAL | Status: AC
  Administered 2020-01-29: 500 mg via ORAL
  Filled 2020-01-29: qty 1

## 2020-01-29 MED ORDER — LIDOCAINE 5 % EX PTCH
1.0000 | MEDICATED_PATCH | CUTANEOUS | Status: DC
  Administered 2020-01-29: 19:00:00 1 via TRANSDERMAL
  Filled 2020-01-29: qty 1

## 2020-01-29 NOTE — ED Notes (Signed)
IV potassium was not infusing at 100 ml/hr as ordered. IV repositioned.  IV now infusing.  Delayed due to inability to infuse.

## 2020-01-29 NOTE — ED Provider Notes (Signed)
[facility] EMERGENCY DEPARTMENT Provider Note   CSN: [id number] Arrival date & time: 01/29/20  1327     History Chief Complaint  Patient presents with  . Chest Pain    [name] [name] is a 59 y.o. female.  [name] [name] is a 59 y.o. female with a history of constipation and previous colostomy, who presents to the emergency department for evaluation of chest pain.  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  This pain is worse with movement.  It is nonradiating and is not pleuritic in nature.  It is not worse with exertion.  Patient states she has never had similar pain.  She has not taken anything to treat this pain.  No associated shortness of breath, cough, fever.  No associated lightheadedness or syncope.  No associated abdominal pain, diaphoresis, nausea or vomiting.  No neck or back pain.  No numbness tingling or weakness in extremities.  No history of heart problems, patient is non-smoker.  No other aggravating or alleviating factors.        Past Medical History:  Diagnosis Date  . Constipation   . Difficulty sleeping   . Literacy level of illiterate 10/01/2016   Less than one year of formal education in [place].  Ms Schlie is taking classes for reading and writing in [place].   . S/P colostomy [facility]) 03/02/2015   March 2016: colon perforation following screening colonoscopy     Patient Active Problem List   Diagnosis Date Noted  . Low back pain 11/08/2019  . Headache 07/18/2018  . Literacy level of illiterate 10/01/2016  . Reduced visual acuity 09/30/2016  . Absolute anemia 07/27/2016  . Esophageal reflux 06/23/2016  . Memory difficulties 06/23/2016  . Insomnia 04/29/2016  . Abdominal pain 01/15/2016  . Colostomy  stricture (HCC) 07/02/2015  . S/P colostomy (HCC) 03/02/2015  . Constipation 12/19/2014  . Neuropathy of foot 12/10/2013  . TOBACCO DEPENDENCE 01/19/2007    Past Surgical History:  Procedure Laterality Date  . ABDOMINAL HYSTERECTOMY  1994  . APPENDECTOMY  1993  . CESAREAN SECTION  1992  . COLONOSCOPY N/A 05/30/2015   Procedure: COLONOSCOPY;  Surgeon: Romie Levee, MD;  Location: [facility] ENDOSCOPY;  Service: Endoscopy;  Laterality: N/A;  . COLONOSCOPY WITH PROPOFOL N/A 06/12/2015   Procedure: COLONOSCOPY WITH PROPOFOL;  Surgeon: Romie Levee, MD;  Location: [facility] ENDOSCOPY;  Service: Endoscopy;  Laterality: N/A;  . COLOSTOMY  01/2015  . COLOSTOMY TAKEDOWN N/A 07/02/2015   Procedure: LAPAROSCOPIC COLOSTOMY REVERSAL SPLENIC FLEXURE MOBILIZATION AND PARTIAL COLECTOMY;  Surgeon: Romie Levee, MD;  Location: [facility] ORS;  Service: General;  Laterality: N/A;  . DILATION AND CURETTAGE OF UTERUS  1990   "miscarriage"  . LAPAROTOMY N/A 02/20/2015   Procedure: PARTIAL COLECTOMY END  ILEOSTOMY AND HARTMAN'S PROCEDURE;  Surgeon: Romie Levee, MD;  Location: [facility] ORS;  Service: General;  Laterality: N/A;     OB History   No obstetric history on file.     Family History  Problem Relation Age of Onset  . Colon cancer Neg Hx     Social History   Tobacco Use  . Smoking status: Current Every Day Smoker    Packs/day: 0.50  Years: 37.00    Pack years: 18.50    Types: Cigarettes  . Smokeless tobacco: Never Used  Substance Use Topics  . Alcohol use: No    Alcohol/week: 0.0 standard drinks  . Drug use: No    Home Medications Prior to Admission medications   Medication Sig Start Date End Date Taking? Authorizing Provider  acetaminophen (TYLENOL) 325 MG tablet Take 2 tablets (650 mg total) by mouth every 6 (six) hours as needed for mild pain or moderate pain. 06/21/17   Palma Holter, MD  docusate sodium (COLACE) 100 MG capsule Take 1 capsule (100 mg total) by mouth 2 (two) times daily. 11/20/18    Sandre Kitty, MD  HYDROCORTISONE, TOPICAL, 2 % LOTN Apply 1 application topically 2 (two) times daily as needed. 06/06/19   Shirley, [place], DO  ibuprofen (ADVIL) 600 MG tablet Take 1 tablet (600 mg total) by mouth every 8 (eight) hours as needed. 11/07/19   Shirley, [place], DO  loratadine (CLARITIN) 10 MG tablet Take 1 tablet (10 mg total) by mouth daily. 06/06/19   Shirley, [place], DO  methocarbamol (ROBAXIN) 500 MG tablet Take 1 tablet (500 mg total) by mouth 2 (two) times daily as needed for muscle spasms. 01/29/20   Dartha Lodge, PA-C  polyethylene glycol powder (GLYCOLAX/MIRALAX) 17 GM/SCOOP powder Take 17 g by mouth every other day. 11/07/19   Shirley, [place], DO  triamcinolone cream (KENALOG) 0.1 % apply to affected area twice a day for 1 week DONOT USE ON FACE,NECK OR SKIN FOLDS Patient not taking: Reported on 07/18/2018 10/12/17   Palma Holter, MD    Allergies    Patient has no known allergies.  Review of Systems   Review of Systems  Constitutional: Negative for chills and fever.  HENT: Negative.   Respiratory: Negative for cough and shortness of breath.   Cardiovascular: Positive for chest pain.  Gastrointestinal: Negative for abdominal pain, nausea and vomiting.  Genitourinary: Negative for dysuria.  Musculoskeletal: Positive for arthralgias. Negative for back pain and myalgias.  Skin: Negative for color change and rash.  Neurological: Negative for dizziness, syncope, light-headedness and headaches.    Physical Exam Updated Vital Signs BP 131/84 (BP Location: Right Arm)   Pulse (!) 58   Temp 98.2 F (36.8 C) (Oral)   Resp 18   Ht 4\' 11"  (1.499 m)   Wt 31.8 kg   SpO2 98%   BMI 14.14 kg/m   Physical Exam Vitals and nursing note reviewed.  Constitutional:      General: She is not in acute distress.    Appearance: She is well-developed and normal weight. She is not ill-appearing or diaphoretic.  HENT:     Head: Normocephalic and atraumatic.  Eyes:      General:        Right eye: No discharge.        Left eye: No discharge.     Pupils: Pupils are equal, round, and reactive to light.  Cardiovascular:     Rate and Rhythm: Normal rate and regular rhythm.     Pulses:          Radial pulses are 2+ on the left side.     Heart sounds: Normal heart sounds. No murmur. No friction rub. No gallop.   Pulmonary:     Effort: Pulmonary effort is normal. No respiratory distress.     Breath sounds: Normal breath sounds. No wheezing or rales.     Comments: Respirations equal and unlabored,  patient able to speak in full sentences, lungs clear to auscultation bilaterally Chest:     Chest wall: Tenderness present.     Comments: Chest pain is repeatedly reproducible with palpation and movement. Abdominal:     General: Bowel sounds are normal. There is no distension.     Palpations: Abdomen is soft. There is no mass.     Tenderness: There is no abdominal tenderness. There is no guarding.     Comments: Abdomen soft, nondistended, nontender to palpation in all quadrants without guarding or peritoneal signs  Musculoskeletal:        General: No deformity.     Cervical back: Neck supple.     Right lower leg: No tenderness. No edema.     Left lower leg: No tenderness. No edema.  Skin:    General: Skin is warm and dry.     Capillary Refill: Capillary refill takes less than 2 seconds.  Neurological:     Mental Status: She is alert.     Coordination: Coordination normal.     Comments: Speech is clear, able to follow commands Moves extremities without ataxia, coordination intact  Psychiatric:        Mood and Affect: Mood normal.        Behavior: Behavior normal.     ED Results / Procedures / Treatments   Labs (all labs ordered are listed, but only abnormal results are displayed) Labs Reviewed  BASIC METABOLIC PANEL - Abnormal; Notable for the following components:      Result Value   Potassium 2.8 (*)    BUN 5 (*)    All other components within  normal limits  CBC - Abnormal; Notable for the following components:   Hemoglobin 10.6 (*)    HCT 34.1 (*)    MCV 70.2 (*)    MCH 21.8 (*)    All other components within normal limits  MAGNESIUM  I-STAT BETA HCG BLOOD, ED ([facility], [facility], [facility] ONLY)  TROPONIN I (HIGH SENSITIVITY)  TROPONIN I (HIGH SENSITIVITY)    EKG EKG Interpretation  Date/Time:  Tuesday January 29 2020 13:31:59 EST Ventricular Rate:  65 PR Interval:  128 QRS Duration: 66 QT Interval:  410 QTC Calculation: 426 R Axis:   96 Text Interpretation: Normal sinus rhythm Rightward axis Nonspecific ST and T wave abnormality Abnormal ECG Confirmed by Quintella Reichert [phone]) on 01/29/2020 4:29:22 PM   Radiology DG Chest 2 View  Result Date: 01/29/2020 CLINICAL DATA:  Chest pain. Pain after picking up heavy bag. EXAM: CHEST - 2 VIEW COMPARISON:  Radiograph 02/24/2015, CT 07/15/2015 FINDINGS: The cardiomediastinal contours are normal. Mild chronic interstitial coarsening is stable. Pulmonary vasculature is normal. No consolidation, pleural effusion, or pneumothorax. No acute osseous abnormalities are seen. Bones appear under mineralized. Rounded calcification in the left upper quadrant represents calcified splenic density, unchanged from prior CT. IMPRESSION: No acute abnormality. Electronically Signed   By: Keith Rake M.D.   On: 01/29/2020 14:18    Procedures Procedures (including critical care time)  Medications Ordered in ED Medications  lidocaine (LIDODERM) 5 % 1 patch (1 patch Transdermal Patch Applied 01/29/20 1906)  potassium chloride SA (KLOR-CON) CR tablet 40 mEq (40 mEq Oral Given 01/29/20 1707)  potassium chloride 10 mEq in 100 mL IVPB (0 mEq Intravenous Stopped 01/29/20 2127)  methocarbamol (ROBAXIN) tablet 500 mg (500 mg Oral Given 01/29/20 1906)  acetaminophen (TYLENOL) tablet 650 mg (650 mg Oral Given 01/29/20 1907)    ED Course  I have reviewed  the triage vital signs and the nursing notes.  Pertinent labs & imaging  results that were available during my care of the patient were reviewed by me and considered in my medical decision making (see chart for details).    MDM Rules/Calculators/A&P                     Patient is to be discharged with recommendation to follow up with PCP in regards to today's hospital visit. Chest pain is not likely of cardiac or pulmonary etiology d/t presentation, PERC negative, VSS, no tracheal deviation, no JVD or new murmur, RRR, breath sounds equal bilaterally, EKG without acute abnormalities, negative troponin, and negative CXR.  Chest pain is reproducible with exam and occurred after lifting something heavy over her head and I suspect she has a pulled muscle in the right side of her chest wall.  Pain improved with treatment here in the ED.  Pt has been advised to return to the ED if CP becomes exertional, associated with diaphoresis or nausea, radiates to left jaw/arm, worsens or becomes concerning in any way. Pt appears reliable for follow up and is agreeable to discharge.    Final Clinical Impression(s) / ED Diagnoses Final diagnoses:  Right-sided chest pain  Muscle strain of chest wall, initial encounter  Hypokalemia    Rx / DC Orders ED Discharge Orders         Ordered    methocarbamol (ROBAXIN) 500 MG tablet  2 times daily PRN     01/29/20 2124           Dartha Lodge, PA-C 01/31/20 2346    Tilden Fossa, MD 02/01/20 1448

## 2020-01-29 NOTE — ED Triage Notes (Signed)
Pt reports chest pain that radiates to her back since yesterday. Pt works in [employer] here and states she picked up a heavy linen bag and felt the pain. Pt alert, nad noted.

## 2020-01-29 NOTE — Discharge Instructions (Signed)
Your work-up today is very reassuring I suspect your pain is due to a strained muscle from when you were lifting a heavy bag of linens.  Use Tylenol, prescribed a muscle relaxer, this can cause drowsiness, do not take before driving, and over-the-counter salon pas lidocaine patches.  Your potassium was low today and we replaced this, you will need to follow-up with your PCP in 1 week to have your potassium rechecked.

## 2020-02-26 ENCOUNTER — Ambulatory Visit: Payer: 59

## 2020-02-26 ENCOUNTER — Other Ambulatory Visit: Payer: 59

## 2020-06-27 ENCOUNTER — Encounter: Payer: Self-pay | Admitting: Family Medicine

## 2020-06-27 ENCOUNTER — Other Ambulatory Visit: Payer: Self-pay

## 2020-06-27 ENCOUNTER — Ambulatory Visit: Payer: 59 | Admitting: Family Medicine

## 2020-06-27 VITALS — BP 108/72 | HR 71 | Ht <= 58 in | Wt 75.6 lb

## 2020-06-27 DIAGNOSIS — Z23 Encounter for immunization: Secondary | ICD-10-CM

## 2020-06-27 DIAGNOSIS — Z0000 Encounter for general adult medical examination without abnormal findings: Secondary | ICD-10-CM | POA: Diagnosis not present

## 2020-06-27 DIAGNOSIS — E876 Hypokalemia: Secondary | ICD-10-CM | POA: Diagnosis not present

## 2020-06-27 DIAGNOSIS — Z933 Colostomy status: Secondary | ICD-10-CM | POA: Diagnosis not present

## 2020-06-27 DIAGNOSIS — D649 Anemia, unspecified: Secondary | ICD-10-CM | POA: Diagnosis not present

## 2020-06-27 HISTORY — DX: Encounter for general adult medical examination without abnormal findings: Z00.00

## 2020-06-27 HISTORY — DX: Hypokalemia: E87.6

## 2020-06-27 NOTE — Progress Notes (Signed)
    SUBJECTIVE:   CHIEF COMPLAINT / HPI:   Annual Exam  No complaints today.   Desire for COVID Vaccination  In the past, she's had a stomach surgery, but not fully recovered. Concerned about whether she is a cadidate. She works for [employer] and will be required to have the vaccine.   PERTINENT  PMH / PSH: history colon perforation during colonoscopy   OBJECTIVE:   BP 108/72   Pulse 71   Ht 4\' 7"  (1.397 m)   Wt 75 lb 9.6 oz (34.3 kg)   SpO2 98%   BMI 17.57 kg/m   Gen: NAD, alert, non-toxic, well-nourished, well-appearing, pleasant HEENT: Normocephaic, atraumatic. PERRLA, clear conjuctiva, no scleral icterus and injection. Normal EOM.  Hearing intact. TM pearly grey bilaterally with no fluid.  Neck supple with no LAD, nodules, or gross abnormality.  Nares patent with no discharge. Mouth with normal mucosa and multiple dental caries.  Oropharynx without erythema and lesions.  Tonsils nonswollen and without exudate.   CV: Regular rate and rhythm.  Normal S1-S2.  No murmur, gallops, S3, S4 appreciated.  Normal capillary refill bilaterally.  Radial pulses 2+ bilaterally. No bilateral lower extremity edema. Resp: Clear to auscultation bilaterally.  No wheezing, rales, rhonchi, or other abnormal lung sounds.  No increased work of breathing appreciated. Abd: Surgical scars present from umbilicus inferiorly. Scar note 3 cm left lateral of umbilicus. Mild TTP at latter scar site. Otherwise, nontender and nondistended on palpation to all 4 quadrants.  Positive bowel sounds. Skin: No obvious rashes, lesions, or trauma.  Normal turgor.  MSK: Normal ROM. Normal strength and tone.  Neuro: Cranial nerves II through VI grossly intact. Gait normal.  Alert and oriented x4.  No obvious abnormal movements. Psych: Cooperative with exam.  Normal speech. Pleasant. Makes good eye contact. Genitourinary: deferred.   ASSESSMENT/PLAN:   Hypokalemia Hypokalemic to 2.8 at last ED visit in March. Will  double check BMP and Mg today.   Anemia Obtain CBC today.   Annual physical exam Overall doing well. Check labs as noted in other problems. Physical exam wnl. Patient provided information for [facility] and has an order in for mammogram. Obtaining first COVID vx today.  Reviewed side effects with patient.     April, MD [facility]

## 2020-06-27 NOTE — Assessment & Plan Note (Signed)
Hypokalemic to 2.8 at last ED visit in March. Will double check BMP and Mg today.

## 2020-06-27 NOTE — Assessment & Plan Note (Signed)
Obtain CBC today.  

## 2020-06-27 NOTE — Assessment & Plan Note (Addendum)
Overall doing well. Check labs as noted in other problems. Physical exam wnl. Patient provided information for [facility] and has an order in for mammogram. Obtaining first COVID vx today.  Reviewed side effects with patient.

## 2020-06-28 LAB — BASIC METABOLIC PANEL
BUN/Creatinine Ratio: 12 (ref 9–23)
BUN: 7 mg/dL (ref 6–24)
CO2: 27 mmol/L (ref 20–29)
Calcium: 9.7 mg/dL (ref 8.7–10.2)
Chloride: 100 mmol/L (ref 96–106)
Creatinine, Ser: 0.59 mg/dL (ref 0.57–1.00)
GFR calc Af Amer: 117 mL/min/{1.73_m2} (ref >59)
GFR calc non Af Amer: 101 mL/min/{1.73_m2} (ref >59)
Glucose: 64 mg/dL — ABNORMAL LOW (ref 65–99)
Potassium: 3.3 mmol/L — ABNORMAL LOW (ref 3.5–5.2)
Sodium: 142 mmol/L (ref 134–144)

## 2020-06-28 LAB — CBC
Hematocrit: 36.2 % (ref 34.0–46.6)
Hemoglobin: 10.9 g/dL — ABNORMAL LOW (ref 11.1–15.9)
MCH: 22.2 pg — ABNORMAL LOW (ref 26.6–33.0)
MCHC: 30.1 g/dL — ABNORMAL LOW (ref 31.5–35.7)
MCV: 74 fL — ABNORMAL LOW (ref 79–97)
Platelets: 245 10*3/uL (ref 150–450)
RBC: 4.9 x10E6/uL (ref 3.77–5.28)
RDW: 15.5 % — ABNORMAL HIGH (ref 11.7–15.4)
WBC: 5.3 10*3/uL (ref 3.4–10.8)

## 2020-06-28 LAB — MAGNESIUM: Magnesium: 2 mg/dL (ref 1.6–2.3)

## 2020-07-07 ENCOUNTER — Telehealth: Payer: Self-pay | Admitting: *Deleted

## 2020-07-07 NOTE — Telephone Encounter (Signed)
Tried to call [employer] interpre[employer] to inform the pt of below and they do not have any logged in at this moment.  Will try again later.Moira Umholtz Zimmerman Rumple, CMA

## 2020-07-07 NOTE — Telephone Encounter (Signed)
-----   Message from Melene Plan, MD sent at 07/02/2020  1:26 PM EDT ----- Patient had no symptoms of hypoglycemia during visit. Her potassium is also borderline low and will recommend that patient take in more potassium through her diet. Patient with chronic anemia that has not been previously worked up. Recommend that patient come back to be seen for follow up.   Please call patient with results. I appreciate your help so much!

## 2020-07-18 ENCOUNTER — Ambulatory Visit (INDEPENDENT_AMBULATORY_CARE_PROVIDER_SITE_OTHER): Payer: 59

## 2020-07-18 ENCOUNTER — Other Ambulatory Visit: Payer: Self-pay

## 2020-07-18 DIAGNOSIS — Z23 Encounter for immunization: Secondary | ICD-10-CM

## 2020-07-18 NOTE — Progress Notes (Signed)
° °  Covid-19 Vaccination Clinic  Name:  Logann Whitebread    MRN: [id number] DOB: 1961-02-03  07/18/2020  Ms. Venn was observed post Covid-19 immunization for 15 minutes without incident. She was provided with Vaccine Information Sheet and instruction to access the V-Safe system.   Ms. Brandvold was instructed to call 911 with any severe reactions post vaccine:  Difficulty breathing   Swelling of face and throat   A fast heartbeat   A bad rash all over body   Dizziness and weakness   #2 Covid Vaccine administered LD without complications.

## 2020-08-07 ENCOUNTER — Encounter: Payer: Self-pay | Admitting: Family Medicine

## 2020-08-07 ENCOUNTER — Other Ambulatory Visit: Payer: Self-pay

## 2020-08-07 ENCOUNTER — Ambulatory Visit: Payer: 59 | Admitting: Family Medicine

## 2020-08-07 ENCOUNTER — Ambulatory Visit (HOSPITAL_COMMUNITY)
Admission: RE | Admit: 2020-08-07 | Discharge: 2020-08-07 | Disposition: A | Discharge status: Home / Self Care | Payer: 59 | Source: Ambulatory Visit | Attending: Family Medicine | Admitting: Family Medicine

## 2020-08-07 VITALS — BP 120/76 | HR 71 | Temp 98.8°F | Ht 59.0 in | Wt 77.1 lb

## 2020-08-07 DIAGNOSIS — R0789 Other chest pain: Secondary | ICD-10-CM

## 2020-08-07 DIAGNOSIS — R079 Chest pain, unspecified: Secondary | ICD-10-CM | POA: Diagnosis not present

## 2020-08-07 DIAGNOSIS — Z603 Acculturation difficulty: Secondary | ICD-10-CM | POA: Insufficient documentation

## 2020-08-07 DIAGNOSIS — R059 Cough, unspecified: Secondary | ICD-10-CM | POA: Insufficient documentation

## 2020-08-07 DIAGNOSIS — R05 Cough: Secondary | ICD-10-CM

## 2020-08-07 DIAGNOSIS — B349 Viral infection, unspecified: Secondary | ICD-10-CM

## 2020-08-07 DIAGNOSIS — R0602 Shortness of breath: Secondary | ICD-10-CM | POA: Diagnosis not present

## 2020-08-07 DIAGNOSIS — Z789 Other specified health status: Secondary | ICD-10-CM

## 2020-08-07 HISTORY — DX: Other chest pain: R07.89

## 2020-08-07 HISTORY — DX: Cough, unspecified: R05.9

## 2020-08-07 MED ORDER — BENZONATATE 100 MG PO CAPS: 200.0000 mg | ORAL_CAPSULE | Freq: Three times a day (TID) | ORAL | 0 refills | Status: DC | PRN

## 2020-08-07 MED ORDER — ALBUTEROL SULFATE HFA 108 (90 BASE) MCG/ACT IN AERS: 2.0000 | INHALATION_SPRAY | Freq: Four times a day (QID) | RESPIRATORY_TRACT | 0 refills | Status: DC | PRN

## 2020-08-07 NOTE — Assessment & Plan Note (Addendum)
In person interpreter present.  Called patient's job to alert them that she will not be in to work [employer].   Coordinated care between [facility] (health at work) and pt's daughter who will help communicate pt's symptoms Called [name] [name]), daughter, [phone]

## 2020-08-07 NOTE — Assessment & Plan Note (Addendum)
Likely viral illness. Overall pt is well appearing, well hydrated, without respiratory distress. Discussed symptomatic treatment. COVID test scheduled for tomorrow at [facility] between 8AM-3 PM.  - continue to monitor for fevers  - continue Tylenol/ Motrin as needed for discomfort - nasal saline to help with his nasal congestion - Use a cool mist humidifier at bedtime to help with breathing - Stressed hydration - Albuterol to help with chest tightness  - Tessalon Perles for cough  - Work note provided - Discussed ED precautions, understanding voiced

## 2020-08-07 NOTE — Assessment & Plan Note (Addendum)
EKG: HR 60, sinus rhythm, normal axis,  no acute ST or T wave changes. Compared to 01/29/20. Personally reviewed by me.   No red flag sx identified. EKG reassuring. Chest and back pain likely 2/2 to ongoing cough.  - Tylenol for pain

## 2020-08-07 NOTE — Patient Instructions (Signed)
It was great seeing you today!  As discussed:  Go get tested tomorrow between 8 AM - 3 PM at [facility]  8AM-3 PM. Your daughter should expect a call from [facility] by Saturday with your results.    If you have questions or concerns please do not hesitate to call at [phone].  Dr. Katherina [facility]

## 2020-08-07 NOTE — Progress Notes (Signed)
° °  SUBJECTIVE:   CHIEF COMPLAINT / HPI:   Chief Complaint  Patient presents with   Back Pain   In person [place] interpreter used during this encounter     [name] [name] is a 59 y.o. female  Who reports chest pain that radiates from front left side through the to the back. Chest pain is worse with cough.  Onset of chest pain started 07/29/20. Worse with movement. Better with sitting. Took Tylenol. Cough is dry. Has shortness of breath and chest tightness.  No known recent sick contacts. No change in chest pain with food but had persistent abdominal pain. Non-exertional, mid-scapular pain, no LE edema, numbness or tingling, radiation to arms, jaw back. No hx of PE or DVT.   Endorses watery non-bloody diarrhea about 3 days ago but was constipated beforehand. Has had some myalgias, night sweats and chills.  Denies headache, sore throat or scratchy throat, nausea or vomiting, changes to taste and smell,  FMHx : unknown if hx of heart disease (immigrant for [place], lived in the [place] for 16 years)     PERTINENT  PMH / PSH: reviewed and updated as appropriate   OBJECTIVE:   BP 120/76    Pulse 71    Temp 98.8 F (37.1 C) (Oral)    Ht 4\' 11"  (1.499 m)    Wt 77 lb 2 oz (35 kg)    SpO2 98%    BMI 15.58 kg/m    GEN: elderly female, in no acute distress who grips chest with cough CV: regular rate and rhythm, no murmurs, equal radial pulses RESP: no increased work of breathing, clear to ascultation bilaterally  ABD: Bowel sounds present. Soft, mild LLQ tenderness,  Nondistended., surgical scars present MSK: no LE edema, or calf tenderness  SKIN: warm, dry   ASSESSMENT/PLAN:   Cough Likely viral illness. Overall pt is well appearing, well hydrated, without respiratory distress. Discussed symptomatic treatment. COVID test scheduled for tomorrow at [facility] between 8AM-3 PM.  - continue to monitor for fevers  - continue Tylenol/ Motrin as needed for discomfort - nasal saline  to help with his nasal congestion - Use a cool mist humidifier at bedtime to help with breathing - Stressed hydration - Albuterol to help with chest tightness  - Tessalon Perles for cough  - Work note provided - Discussed ED precautions, understanding voiced   Language barrier affecting health care In person interpreter present.  Called patient's job to alert them that she will not be in to work [employer] [facility]   Coordinated care between [facility] (health at work) and pt's daughter who will help communicate pt's symptoms Called Hnge Glaus Marga Hoots), daughter, [phone]    Atypical chest pain EKG: HR 60, sinus rhythm, normal axis,  no acute ST or T wave changes. Compared to 01/29/20. Personally reviewed by me.   No red flag sx identified. EKG reassuring. Chest and back pain likely 2/2 to ongoing cough.  - Tylenol for pain         03/30/20, DO PGY-2, [facility] 08/07/2020

## 2020-08-08 NOTE — Telephone Encounter (Signed)
Pt had an appt with Dr. Rachael Darby 08/07/2020. Sunday Spillers, CMA

## 2020-08-12 ENCOUNTER — Ambulatory Visit: Payer: 59 | Admitting: Family Medicine

## 2020-11-07 ENCOUNTER — Other Ambulatory Visit: Payer: Self-pay

## 2020-11-07 ENCOUNTER — Emergency Department (HOSPITAL_COMMUNITY)
Admission: EM | Admit: 2020-11-07 | Discharge: 2020-11-08 | Disposition: A | Discharge status: Home / Self Care | Payer: 59 | Attending: Emergency Medicine | Admitting: Emergency Medicine

## 2020-11-07 DIAGNOSIS — R1030 Lower abdominal pain, unspecified: Secondary | ICD-10-CM

## 2020-11-07 DIAGNOSIS — E876 Hypokalemia: Secondary | ICD-10-CM | POA: Insufficient documentation

## 2020-11-07 DIAGNOSIS — R1031 Right lower quadrant pain: Secondary | ICD-10-CM | POA: Diagnosis not present

## 2020-11-07 DIAGNOSIS — R1110 Vomiting, unspecified: Secondary | ICD-10-CM | POA: Insufficient documentation

## 2020-11-07 DIAGNOSIS — F17210 Nicotine dependence, cigarettes, uncomplicated: Secondary | ICD-10-CM | POA: Insufficient documentation

## 2020-11-07 NOTE — ED Triage Notes (Signed)
Pt presents to ED POV. Pt c/o lower abd pain pt reports some constipation. Denies GU s/s.

## 2020-11-08 ENCOUNTER — Emergency Department (HOSPITAL_COMMUNITY): Payer: 59

## 2020-11-08 DIAGNOSIS — R1031 Right lower quadrant pain: Secondary | ICD-10-CM | POA: Diagnosis not present

## 2020-11-08 DIAGNOSIS — R1110 Vomiting, unspecified: Secondary | ICD-10-CM | POA: Diagnosis not present

## 2020-11-08 DIAGNOSIS — E876 Hypokalemia: Secondary | ICD-10-CM | POA: Diagnosis not present

## 2020-11-08 DIAGNOSIS — F17210 Nicotine dependence, cigarettes, uncomplicated: Secondary | ICD-10-CM | POA: Diagnosis not present

## 2020-11-08 LAB — URINALYSIS, ROUTINE W REFLEX MICROSCOPIC
Bacteria, UA: NONE SEEN
Bilirubin Urine: NEGATIVE
Glucose, UA: NEGATIVE mg/dL
Hgb urine dipstick: NEGATIVE
Ketones, ur: NEGATIVE mg/dL
Nitrite: NEGATIVE
Protein, ur: NEGATIVE mg/dL
Specific Gravity, Urine: 1.002 — ABNORMAL LOW (ref 1.005–1.030)
pH: 7 (ref 5.0–8.0)

## 2020-11-08 LAB — CBC
HCT: 30.9 % — ABNORMAL LOW (ref 36.0–46.0)
Hemoglobin: 10 g/dL — ABNORMAL LOW (ref 12.0–15.0)
MCH: 23.1 pg — ABNORMAL LOW (ref 26.0–34.0)
MCHC: 32.4 g/dL (ref 30.0–36.0)
MCV: 71.5 fL — ABNORMAL LOW (ref 80.0–100.0)
Platelets: 203 10*3/uL (ref 150–400)
RBC: 4.32 MIL/uL (ref 3.87–5.11)
RDW: 13.5 % (ref 11.5–15.5)
WBC: 6.2 10*3/uL (ref 4.0–10.5)
nRBC: 0 % (ref 0.0–0.2)

## 2020-11-08 LAB — COMPREHENSIVE METABOLIC PANEL
ALT: 10 U/L (ref 0–44)
AST: 16 U/L (ref 15–41)
Albumin: 3.5 g/dL (ref 3.5–5.0)
Alkaline Phosphatase: 84 U/L (ref 38–126)
Anion gap: 11 (ref 5–15)
BUN: <5 mg/dL — ABNORMAL LOW (ref 6–20)
CO2: 29 mmol/L (ref 22–32)
Calcium: 8.9 mg/dL (ref 8.9–10.3)
Chloride: 99 mmol/L (ref 98–111)
Creatinine, Ser: 0.7 mg/dL (ref 0.44–1.00)
GFR, Estimated: >60 mL/min (ref >60)
Glucose, Bld: 115 mg/dL — ABNORMAL HIGH (ref 70–99)
Potassium: 2.5 mmol/L — CL (ref 3.5–5.1)
Sodium: 139 mmol/L (ref 135–145)
Total Bilirubin: 0.5 mg/dL (ref 0.3–1.2)
Total Protein: 6.5 g/dL (ref 6.5–8.1)

## 2020-11-08 LAB — I-STAT BETA HCG BLOOD, ED (MC, WL, AP ONLY): I-stat hCG, quantitative: <5 m[IU]/mL (ref <5)

## 2020-11-08 LAB — LIPASE, BLOOD: Lipase: 29 U/L (ref 11–51)

## 2020-11-08 MED ORDER — POTASSIUM CHLORIDE CRYS ER 20 MEQ PO TBCR
20.0000 meq | EXTENDED_RELEASE_TABLET | Freq: Once | ORAL | Status: AC
  Administered 2020-11-08: 20 meq via ORAL
  Filled 2020-11-08: qty 1

## 2020-11-08 MED ORDER — FENTANYL CITRATE (PF) 100 MCG/2ML IJ SOLN
50.0000 ug | Freq: Once | INTRAMUSCULAR | Status: AC
  Administered 2020-11-08: 50 ug via INTRAVENOUS
  Filled 2020-11-08: qty 2

## 2020-11-08 MED ORDER — POTASSIUM CHLORIDE 10 MEQ/100ML IV SOLN
10.0000 meq | Freq: Once | INTRAVENOUS | Status: AC
  Administered 2020-11-08: 10 meq via INTRAVENOUS
  Filled 2020-11-08: qty 100

## 2020-11-08 MED ORDER — ONDANSETRON HCL 4 MG/2ML IJ SOLN
4.0000 mg | Freq: Once | INTRAMUSCULAR | Status: AC
  Administered 2020-11-08: 4 mg via INTRAVENOUS
  Filled 2020-11-08: qty 2

## 2020-11-08 MED ORDER — SODIUM CHLORIDE 0.9 % IV BOLUS
1000.0000 mL | Freq: Once | INTRAVENOUS | Status: AC
  Administered 2020-11-08: 1000 mL via INTRAVENOUS

## 2020-11-08 MED ORDER — POTASSIUM CHLORIDE ER 20 MEQ PO TBCR: 20.0000 meq | EXTENDED_RELEASE_TABLET | Freq: Every day | ORAL | 0 refills | Status: DC

## 2020-11-08 MED ORDER — IOHEXOL 300 MG/ML  SOLN
100.0000 mL | Freq: Once | INTRAMUSCULAR | Status: AC | PRN
  Administered 2020-11-08: 100 mL via INTRAVENOUS

## 2020-11-08 NOTE — ED Notes (Signed)
Pt given saltine crackers & ice water for PO challenge.

## 2020-11-08 NOTE — ED Notes (Signed)
Daughter at bedside assisting with interpretation

## 2020-11-08 NOTE — Discharge Instructions (Addendum)
Your work up today was reassuring.   Your potassium was low. We will plan to repleat it with the potassium pills. Tomorrow start taking them.   Follow up with your primary care doctor or the [facility].  Return to the Emergency Department immediately if you experience any worsening abdominal pain, fever, persistent nausea and vomiting, inability keep any food down, pain with urination, blood in your urine or any other worsening or concerning symptoms.

## 2020-11-08 NOTE — ED Provider Notes (Signed)
[facility] EMERGENCY DEPARTMENT Provider Note   CSN: [id number] Arrival date & time: 11/07/20  2214     History Chief Complaint  Patient presents with  . Abdominal Pain    [name] [name] is a 59 y.o. female past medical history of colostomy stricture, constipation who presents for evaluation of abdominal pain that began last night about 8:30 PM.  She reports she was at work when she started experiencing some periumbilical abdominal pain.  She describes it as a sharp pain.  She states that since then, has improved but she still has some residual pain in the right lower abdominal area.  She states she had one episode of vomiting but since then has not had any more vomiting.  She reports she has had some constipation and states her last bowel movement was 3 days ago.  She has not taken any medication for the pain.  She denies any fevers, chest pain, difficulty breathing, dysuria, hematuria.  The history is provided by the patient and a relative. No language interpreter was used (I Geneticist, molecular but daughter preferred to interpret for the patient).       Past Medical History:  Diagnosis Date  . 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 Constipation   . Difficulty sleeping   . Literacy level of illiterate 10/01/2016   Less than one year of formal education in [place].  Ms Hemmer is taking classes for reading and writing in [place].   . S/P colostomy [facility]) 03/02/2015   March 2016: colon perforation following screening colonoscopy   . S/P colostomy [facility]) 03/02/2015   March 2016: colon perforation following screening colonoscopy     Patient Active Problem List   Diagnosis Date Noted  . Atypical chest pain 08/07/2020  . Cough 08/07/2020  . Language barrier affecting health care 08/07/2020  . Hypokalemia 06/27/2020  . Annual physical exam 06/27/2020  . Literacy level of illiterate 10/01/2016  . Reduced visual acuity 09/30/2016  . Anemia 07/27/2016    Past Surgical  History:  Procedure Laterality Date  . ABDOMINAL HYSTERECTOMY  1994  . APPENDECTOMY  1993  . CESAREAN SECTION  1992  . COLONOSCOPY N/A 05/30/2015   Procedure: COLONOSCOPY;  Surgeon: Romie Levee, MD;  Location: [facility] ENDOSCOPY;  Service: Endoscopy;  Laterality: N/A;  . COLONOSCOPY WITH PROPOFOL N/A 06/12/2015   Procedure: COLONOSCOPY WITH PROPOFOL;  Surgeon: Romie Levee, MD;  Location: [facility] ENDOSCOPY;  Service: Endoscopy;  Laterality: N/A;  . COLOSTOMY  01/2015  . COLOSTOMY TAKEDOWN N/A 07/02/2015   Procedure: LAPAROSCOPIC COLOSTOMY REVERSAL SPLENIC FLEXURE MOBILIZATION AND PARTIAL COLECTOMY;  Surgeon: Romie Levee, MD;  Location: [facility] ORS;  Service: General;  Laterality: N/A;  . DILATION AND CURETTAGE OF UTERUS  1990   "miscarriage"  . LAPAROTOMY N/A 02/20/2015   Procedure: PARTIAL COLECTOMY END  ILEOSTOMY AND HARTMAN'S PROCEDURE;  Surgeon: Romie Levee, MD;  Location: [facility] ORS;  Service: General;  Laterality: N/A;     OB History   No obstetric history on file.     Family History  Problem Relation Age of Onset  . Colon cancer Neg Hx     Social History   Tobacco Use  . Smoking status: Current Every Day Smoker    Packs/day: 0.50    Years: 37.00    Pack years: 18.50    Types: Cigarettes  . Smokeless tobacco: Never Used  Substance Use Topics  . Alcohol use: No    Alcohol/week: 0.0 standard drinks  . Drug use: No  Home Medications Prior to Admission medications   Medication Sig Start Date End Date Taking? Authorizing Provider  albuterol (VENTOLIN HFA) 108 (90 Base) MCG/ACT inhaler Inhale 2 puffs into the lungs every 6 (six) hours as needed for wheezing or shortness of breath. 08/07/20   Brimage, Seward Meth, DO  benzonatate (TESSALON PERLES) 100 MG capsule Take 2 capsules (200 mg total) by mouth 3 (three) times daily as needed for cough. 08/07/20   Brimage, Seward Meth, DO  potassium chloride 20 MEQ TBCR Take 20 mEq by mouth daily for 4 days. 11/08/20 11/12/20  Maxwell Caul, PA-C     Allergies    Patient has no known allergies.  Review of Systems   Review of Systems  Constitutional: Negative for fever.  Respiratory: Negative for cough and shortness of breath.   Cardiovascular: Negative for chest pain.  Gastrointestinal: Positive for abdominal pain and vomiting. Negative for nausea.  Genitourinary: Negative for dysuria and hematuria.  Neurological: Negative for headaches.  All other systems reviewed and are negative.   Physical Exam Updated Vital Signs BP (!) 150/75   Pulse 68   Temp 98 F (36.7 C)   Resp 18   SpO2 100%   Physical Exam Vitals and nursing note reviewed.  Constitutional:      Appearance: Normal appearance. She is well-developed and well-nourished.     Comments: Sitting comfortably on examination table  HENT:     Head: Normocephalic and atraumatic.     Mouth/Throat:     Mouth: Oropharynx is clear and moist and mucous membranes are normal.  Eyes:     General: Lids are normal.     Extraocular Movements: EOM normal.     Conjunctiva/sclera: Conjunctivae normal.     Pupils: Pupils are equal, round, and reactive to light.  Cardiovascular:     Rate and Rhythm: Normal rate and regular rhythm.     Pulses: Normal pulses.     Heart sounds: Normal heart sounds. No murmur heard. No friction rub. No gallop.   Pulmonary:     Effort: Pulmonary effort is normal.     Breath sounds: Normal breath sounds.     Comments: Lungs clear to auscultation bilaterally.  Symmetric chest rise.  No wheezing, rales, rhonchi. Abdominal:     Palpations: Abdomen is soft. Abdomen is not rigid.     Tenderness: There is abdominal tenderness in the right lower quadrant. There is no guarding.     Comments: Abdomen soft, nondistended.  Focal tenderness in the right lower quadrant.  No rigidity, guarding.  No CVA tenderness noted bilaterally.  Musculoskeletal:        General: Normal range of motion.     Cervical back: Full passive range of motion without pain.   Skin:    General: Skin is warm and dry.     Capillary Refill: Capillary refill takes less than 2 seconds.  Neurological:     Mental Status: She is alert and oriented to person, place, and time.  Psychiatric:        Mood and Affect: Mood and affect normal.        Speech: Speech normal.     ED Results / Procedures / Treatments   Labs (all labs ordered are listed, but only abnormal results are displayed) Labs Reviewed  COMPREHENSIVE METABOLIC PANEL - Abnormal; Notable for the following components:      Result Value   Potassium 2.5 (*)    Glucose, Bld 115 (*)    BUN <5 (*)  All other components within normal limits  CBC - Abnormal; Notable for the following components:   Hemoglobin 10.0 (*)    HCT 30.9 (*)    MCV 71.5 (*)    MCH 23.1 (*)    All other components within normal limits  URINALYSIS, ROUTINE W REFLEX MICROSCOPIC - Abnormal; Notable for the following components:   Color, Urine STRAW (*)    Specific Gravity, Urine 1.002 (*)    Leukocytes,Ua TRACE (*)    All other components within normal limits  LIPASE, BLOOD  I-STAT BETA HCG BLOOD, ED ([facility], [facility], [facility] ONLY)    EKG None  Radiology CT ABDOMEN PELVIS W CONTRAST  Result Date: 11/08/2020 CLINICAL DATA:  Right lower quadrant pain.  Constipation. EXAM: CT ABDOMEN AND PELVIS WITH CONTRAST TECHNIQUE: Multidetector CT imaging of the abdomen and pelvis was performed using the standard protocol following bolus administration of intravenous contrast. CONTRAST:  OMNIPAQUE IOHEXOL 300 MG/ML  SOLN COMPARISON:  07/15/2015 FINDINGS: Lower Chest: No acute findings. Hepatobiliary: No hepatic masses identified. Gallbladder is unremarkable. No evidence of biliary ductal dilatation. Pancreas:  No mass or inflammatory changes. Spleen: Within normal limits in size densely calcified benign appearing lesion in the medial aspect of the spleen is stable since previous study, consistent with benign etiology. Adrenals/Urinary Tract: No masses  identified. No evidence of ureteral calculi or hydronephrosis. Stomach/Bowel: No evidence of obstruction, inflammatory process or abnormal fluid collections. Dense benign-appearing calcification in the right lower quadrant mesentery remains stable. Surgical anastomosis seen in the region of the rectosigmoid junction. Normal appendix visualized. Vascular/Lymphatic: No pathologically enlarged lymph nodes. No abdominal aortic aneurysm. Aortic atherosclerotic calcification noted. Reproductive: Prior hysterectomy noted. Adnexal regions are unremarkable in appearance. Other:  None. Musculoskeletal:  No suspicious bone lesions identified. IMPRESSION: No evidence of appendicitis or other acute findings. Aortic Atherosclerosis (ICD10-I70.0). Electronically Signed   By: Danae Orleans M.D.   On: 11/08/2020 11:15    Procedures Procedures (including critical care time)  Medications Ordered in ED Medications  fentaNYL (SUBLIMAZE) injection 50 mcg (50 mcg Intravenous Given 11/08/20 0958)  ondansetron (ZOFRAN) injection 4 mg (4 mg Intravenous Given 11/08/20 0957)  sodium chloride 0.9 % bolus 1,000 mL (0 mLs Intravenous Stopped 11/08/20 1125)  potassium chloride 10 mEq in 100 mL IVPB (0 mEq Intravenous Stopped 11/08/20 1125)  iohexol (OMNIPAQUE) 300 MG/ML solution 100 mL (100 mLs Intravenous Contrast Given 11/08/20 1032)  potassium chloride SA (KLOR-CON) CR tablet 20 mEq (20 mEq Oral Given 11/08/20 1142)    ED Course  I have reviewed the triage vital signs and the nursing notes.  Pertinent labs & imaging results that were available during my care of the patient were reviewed by me and considered in my medical decision making (see chart for details).    MDM Rules/Calculators/A&P                          59 year old female presents for evaluation of abdominal pain began last night.  Associated with one episode of vomiting.  No fevers.  Also reports she has had some constipation.  On initial arrival, she is  afebrile, nontoxic-appearing.  Vital signs are stable.  On exam, she has some focal tenderness into the right lower quadrant.  She does have a history of colostomy and colonic stricture.  Consider small bowel obstruction versus infectious process.  Also given focal right lower quadrant tenderness, consider appendicitis.  Labs ordered at triage.  Will obtain CT imaging.  I-STAT beta negative.  Lipase is normal.  CBC shows no leukocytosis.  Hemoglobin stable at 10.0.  CMP shows potassium of 3.5.  We will plan to replete.  BUN/creatinine within normal limits.  UA shows trace leukocytes.  CT scan shows no evidence of obstruction.  No evidence of infectious process.  Surgical abdomen anastomosis seen.  Normal appendix visualized.  Adnexa unremarkable.  Reevaluation.  Patient reports improvement in pain.  Repeat abdominal exam is improved.  We will plan to p.o. challenge patient.  Patient able tolerate p.o. without any difficulty.  She is hemodynamically stable.  Repeat abdominal exam shows no tenderness.  Patient states she is ready to go home.  Encouraged at home supportive care measures.  We will put her on a few days of potassium for repletion. At this time, patient exhibits no emergent life-threatening condition that require further evaluation in ED. Patient had ample opportunity for questions and discussion. All patient's questions were answered with full understanding. Strict return precautions discussed. Patient expresses understanding and agreement to plan.   Portions of this note were generated with Scientist, clinical (histocompatibility and immunogenetics)Dragon dictation software. Dictation errors may occur despite best attempts at proofreading.   Final Clinical Impression(s) / ED Diagnoses Final diagnoses:  Lower abdominal pain  Hypokalemia    Rx / DC Orders ED Discharge Orders         Ordered    potassium chloride 20 MEQ TBCR  Daily        11/08/20 1207           Maxwell CaulLayden, Pariss Hommes A, PA-C 11/08/20 1255    Sabino DonovanKatz, Eric C, MD 11/08/20  1255

## 2020-11-13 ENCOUNTER — Ambulatory Visit: Payer: 59 | Admitting: Family Medicine

## 2020-11-13 ENCOUNTER — Other Ambulatory Visit: Payer: Self-pay

## 2020-11-13 ENCOUNTER — Encounter: Payer: Self-pay | Admitting: Family Medicine

## 2020-11-13 VITALS — BP 118/72 | HR 70 | Ht 59.0 in | Wt 75.8 lb

## 2020-11-13 DIAGNOSIS — R519 Headache, unspecified: Secondary | ICD-10-CM | POA: Diagnosis not present

## 2020-11-13 DIAGNOSIS — R197 Diarrhea, unspecified: Secondary | ICD-10-CM | POA: Diagnosis not present

## 2020-11-13 DIAGNOSIS — E876 Hypokalemia: Secondary | ICD-10-CM

## 2020-11-13 DIAGNOSIS — Z23 Encounter for immunization: Secondary | ICD-10-CM | POA: Diagnosis not present

## 2020-11-13 DIAGNOSIS — R110 Nausea: Secondary | ICD-10-CM

## 2020-11-13 DIAGNOSIS — D649 Anemia, unspecified: Secondary | ICD-10-CM | POA: Diagnosis not present

## 2020-11-13 DIAGNOSIS — R1032 Left lower quadrant pain: Secondary | ICD-10-CM | POA: Diagnosis not present

## 2020-11-13 HISTORY — DX: Diarrhea, unspecified: R19.7

## 2020-11-13 MED ORDER — IBUPROFEN 600 MG PO TABS: 600.0000 mg | ORAL_TABLET | Freq: Three times a day (TID) | ORAL | 0 refills | Status: DC | PRN

## 2020-11-13 NOTE — Assessment & Plan Note (Signed)
HA x 8-9 days, coinciding with acute watery diarrhea. Not responsive to tylenol like her normal headaches. Pt also tried sudafed sinus congestion without relief. Prescribed ibuprofen 600 mg for intermittent use. Emphasized need for extra hydration during acute diarrhea. Also recommended trying naproxen and alternating ibuprofen with tylenol.

## 2020-11-13 NOTE — Assessment & Plan Note (Signed)
CBC and CMP obtained in clinic today. Patient sent with collection kit for stool pathogen panel, ova and parasite, and culture. Will return as soon as possible. With holiday closures, next possible drop off day for clinic Mon. 12/27. No anti-diarrheal at this time, given possible infectious etiology.

## 2020-11-13 NOTE — Progress Notes (Signed)
SUBJECTIVE:   CHIEF COMPLAINT / HPI:   While patient is learning [place], her first language is [place]. Her daughter is here with her. The regular [place] in-person interpreter is absent for unknown reasons, so daughter offered to translate. Proper paperwork signed.   Abdominal pain, diarrhea, nausea Patient reports 8 or 9 days of watery diarrhea, since the Tuesday before last.  This is accompanied by abdominal pain which started on Friday, is sharp in nature and is of the left lower quadrant.  Patient was seen in ED for this sharp abdominal pain on 12/17; EKG and CT abdomen normal.  CBC demonstrated stable anemia with hemoglobin 10.0.  CMP demonstrated potassium 2.5, glucose 115, BUN <5.  In ED, patient was repleted with 10 mEq IV PB x1 and sent home with prescription for 20 mEq potassium chloride repletion daily x4 days.  Notably, patient has history of colostomy, now closed, and colonic stricture; pain is most surrounding this area.  Patient describes this watery diarrhea as green-colored "kind of like [employer]". No recent antibiotic use. Her urine output and appearance has been normal for her, which she reports to be orange in the morning and a green-ish yellow after drinking [facility].  She reports being normally constipated after removal of her colostomy with resultant colonic stricture.  However, the past 8 days it is an uncomfortable watery diarrhea.  She mainly drinks water and [facility].  No new foods, no new drinks.  Only raw or undercooked food she consumes are raw oysters about once weekly from the [employer] on [address].  Patient denies fever but says that she feels "hot and cold".  A little bit of nasal congestion the past couple days.  Some nausea, however no vomiting.  Feels a little weak only today, has been sleeping a lot at home over the weekend and does not really feel like herself.  No shortness of breath, wheezing, chest pain,  palpitations, swelling of joints.  Headache Patient reports headache since diarrhea started 8 or 9 days ago.  She says that normally her headaches respond to Tylenol.  This headache, however, does not go away.  She has tried Tylenol and Sudafed sinus congestion without relief.  This headache is mainly frontotemporal in location. She says that she has tried Tylenol numerous times and it does not go away.  This headache is intermittent but is worse than her normal headaches.  Denies vision changes, dizziness, syncope, balance issues.  PERTINENT  PMH / PSH: Anemia, reduced visual acuity, hypokalemia, atypical chest pain, cough, language barrier  OBJECTIVE:   BP 118/72   Pulse 70   Ht 4' 11"  (1.499 m)   Wt 75 lb 12.8 oz (34.4 kg)   SpO2 98%   BMI 15.31 kg/m    PHQ-9:  Depression screen [facility] 2/9 08/07/2020 06/27/2020 11/07/2019  Decreased Interest 0 0 0  Down, Depressed, Hopeless 0 0 0  PHQ - 2 Score 0 0 0  Altered sleeping 0 - -  Tired, decreased energy 0 - -  Change in appetite 0 - -  Feeling bad or failure about yourself  0 - -  Trouble concentrating 1 - -  Moving slowly or fidgety/restless 1 - -  Suicidal thoughts 0 - -  PHQ-9 Score 2 - -  Difficult doing work/chores Somewhat difficult - -     GAD-7: No flowsheet data found.    Physical Exam General: Awake, alert, oriented Cardiovascular: Regular rate and  rhythm, S1 and S2 present, no murmurs auscultated Respiratory: Lung fields clear to auscultation bilaterally Extremities: No bilateral lower extremity edema, palpable pedal and pretibial pulses bilaterally Abdomen: Hyperactive bowel sounds in all 4 quadrants, TTP at umbilicus and left lower quadrant Neuro: Cranial nerves II through X grossly intact, able to move all extremities spontaneously   ASSESSMENT/PLAN:   Diarrhea CBC and CMP obtained in clinic today. Patient sent with collection kit for stool pathogen panel, ova and parasite, and culture. Will return as soon as  possible. With holiday closures, next possible drop off day for clinic Mon. 12/27. No anti-diarrheal at this time, given possible infectious etiology.   Hypokalemia Visited ED 12/17 for abdominal pain, diarrhea, headache. Was found to be hypokalemic with BMP K+ of 2.5. Patient somewhat repleted by ED; given 10 mEq IVPB x1 and sent home with script for 20 mEq PO K+ x 4 days. Obtained CBC, CMP in clinic today. Will prescribe repletion as indicated by today's potassium level.   Headache HA x 8-9 days, coinciding with acute watery diarrhea. Not responsive to tylenol like her normal headaches. Pt also tried sudafed sinus congestion without relief. Prescribed ibuprofen 600 mg for intermittent use. Emphasized need for extra hydration during acute diarrhea. Also recommended trying naproxen and alternating ibuprofen with tylenol.   Anemia CBC obtained today. ED visit 12/17 found Hgb 10.0, consistent with previous measurements.     Ezequiel Essex, MD [place]

## 2020-11-13 NOTE — Assessment & Plan Note (Signed)
CBC obtained today. ED visit 12/17 found Hgb 10.0, consistent with previous measurements.

## 2020-11-13 NOTE — Patient Instructions (Addendum)
It was wonderful to meet you today. Thank you for allowing me to be a part of your care. Below is a short summary of what we discussed at your visit today:  Abdominal Pain and Diarrhea We need to figure out if your diarrhea is from an infectious source like a bacteria before we give you medicine to stop your diarrhea.  If this is an infection, diarrhea is your body's natural way of flushing out the infection.  If we give medicine to stop diarrhea while you have an intestinal infection, it can make the infection worse.  While you are having diarrhea, please make sure to drink plenty of fluids like water and eat broth-based foods like soups to help you stay hydrated.  Make sure to rest well too.  We obtained a blood sample to perform a couple tests to make sure that your electrolytes and cell counts are normal.  We also send you home with supplies to collect a stool sample.  Please follow the instructions in place the stool sample in each container as directed.  Remember, do not get any urine mixed in with the stool as this will give [place] incorrect results.  Return a stool sample to the clinic lab as soon as possible.  We are next open on Monday 12/27.  Remember, your stool sample must be no more than 24 hours old.  This means that if you drop the sample off at the clinic lab on Monday afternoon, the stool sample should have been produced no earlier than Sunday afternoon.  As soon as we have results from your test, I will pass them along to you.  If everything is normal, I will send you a letter or MyChart message.  If anything comes back abnormal or positive, I will give you a phone call to let you know about the result and give you plan to go forward.  Headache Drinking plenty of fluids like water and tea will help to hydrate you and may help your headache go away.  I believe his headache is most likely due to dehydration from all your diarrhea recently.  If Tylenol is not working, you may take other  medications from over-the-counter like ibuprofen or naproxen.  I have sent some strong dose ibuprofen to your pharmacy.  Try a lower dose first and if your headache still persists 4 to 6 hours later, then you may try the strong dose ibuprofen (600 mg).  Be sure to alternate ibuprofen with Tylenol, meaning if you take Tylenol in the morning and your headache still persists, try ibuprofen in the afternoon.  Try to take the smallest amount of ibuprofen that works for your headache as possible.  Taking high doses of ibuprofen for too long can cause gastrointestinal issues like stomach ulcers, and I certainly would not want to add on to your current intestinal upset.  Raw Foods Avoid any raw or undercooked foods right now while your stomach is hurting and you are having diarrhea.  If you have any questions or concerns, please do not hesitate to contact us via phone or MyChart message.   Fayette Pho, MD

## 2020-11-13 NOTE — Assessment & Plan Note (Signed)
Visited ED 12/17 for abdominal pain, diarrhea, headache. Was found to be hypokalemic with BMP K+ of 2.5. Patient somewhat repleted by ED; given 10 mEq IVPB x1 and sent home with script for 20 mEq PO K+ x 4 days. Obtained CBC, CMP in clinic today. Will prescribe repletion as indicated by today's potassium level.

## 2020-11-14 LAB — COMPREHENSIVE METABOLIC PANEL
ALT: 9 IU/L (ref 0–32)
AST: 17 IU/L (ref 0–40)
Albumin/Globulin Ratio: 1.8 (ref 1.2–2.2)
Albumin: 4.4 g/dL (ref 3.8–4.9)
Alkaline Phosphatase: 103 IU/L (ref 44–121)
BUN/Creatinine Ratio: 13 (ref 9–23)
BUN: 8 mg/dL (ref 6–24)
Bilirubin Total: 0.3 mg/dL (ref 0.0–1.2)
CO2: 28 mmol/L (ref 20–29)
Calcium: 9.2 mg/dL (ref 8.7–10.2)
Chloride: 100 mmol/L (ref 96–106)
Creatinine, Ser: 0.64 mg/dL (ref 0.57–1.00)
GFR calc Af Amer: 114 mL/min/{1.73_m2} (ref >59)
GFR calc non Af Amer: 99 mL/min/{1.73_m2} (ref >59)
Globulin, Total: 2.4 g/dL (ref 1.5–4.5)
Glucose: 81 mg/dL (ref 65–99)
Potassium: 3.7 mmol/L (ref 3.5–5.2)
Sodium: 139 mmol/L (ref 134–144)
Total Protein: 6.8 g/dL (ref 6.0–8.5)

## 2020-11-14 LAB — CBC
Hematocrit: 35.7 % (ref 34.0–46.6)
Hemoglobin: 11.1 g/dL (ref 11.1–15.9)
MCH: 22.8 pg — ABNORMAL LOW (ref 26.6–33.0)
MCHC: 31.1 g/dL — ABNORMAL LOW (ref 31.5–35.7)
MCV: 74 fL — ABNORMAL LOW (ref 79–97)
Platelets: 238 10*3/uL (ref 150–450)
RBC: 4.86 x10E6/uL (ref 3.77–5.28)
RDW: 14.6 % (ref 11.7–15.4)
WBC: 6.4 10*3/uL (ref 3.4–10.8)

## 2020-11-17 ENCOUNTER — Encounter: Payer: Self-pay | Admitting: Family Medicine

## 2020-11-17 DIAGNOSIS — R1032 Left lower quadrant pain: Secondary | ICD-10-CM | POA: Diagnosis not present

## 2020-11-17 DIAGNOSIS — R110 Nausea: Secondary | ICD-10-CM | POA: Diagnosis not present

## 2020-11-17 DIAGNOSIS — E876 Hypokalemia: Secondary | ICD-10-CM | POA: Diagnosis not present

## 2020-11-17 DIAGNOSIS — D649 Anemia, unspecified: Secondary | ICD-10-CM | POA: Diagnosis not present

## 2020-11-20 LAB — GI PROFILE, STOOL, PCR

## 2020-11-20 LAB — STOOL CULTURE: E coli, Shiga toxin Assay: NEGATIVE

## 2020-11-24 ENCOUNTER — Telehealth: Payer: Self-pay | Admitting: Family Medicine

## 2020-11-24 NOTE — Telephone Encounter (Signed)
Used interpreter line to call patient's home phone number [phone] regarding her stool pathogen results. She asked to call us back later and hung up.   Given that her daughter, Latrecia Capito, was interpreting for her at the last visit and the chart contact info says it is okay to call the daughter with information and questions, I next called her daughter's cell phone at [phone]. No answer, voicemail not set up.   Will try again later. Simply wanted to relay the negative stool pathogen panel and check in on how she's doing.   Fayette Pho, MD

## 2020-11-27 LAB — OVA AND PARASITE EXAMINATION

## 2021-03-18 ENCOUNTER — Ambulatory Visit (HOSPITAL_COMMUNITY)
Admission: EM | Admit: 2021-03-18 | Discharge: 2021-03-18 | Disposition: A | Discharge status: Home / Self Care | Payer: 59

## 2021-03-18 ENCOUNTER — Emergency Department (HOSPITAL_COMMUNITY): Payer: 59

## 2021-03-18 ENCOUNTER — Inpatient Hospital Stay (HOSPITAL_COMMUNITY): Payer: 59

## 2021-03-18 ENCOUNTER — Encounter (HOSPITAL_COMMUNITY): Payer: Self-pay | Admitting: Emergency Medicine

## 2021-03-18 ENCOUNTER — Inpatient Hospital Stay (HOSPITAL_COMMUNITY)
Admission: EM | Admit: 2021-03-18 | Discharge: 2021-03-20 | DRG: 390 | Disposition: A | Discharge status: Home / Self Care | Payer: 59 | Attending: General Surgery | Admitting: General Surgery

## 2021-03-18 ENCOUNTER — Other Ambulatory Visit: Payer: Self-pay

## 2021-03-18 DIAGNOSIS — Z20822 Contact with and (suspected) exposure to covid-19: Secondary | ICD-10-CM | POA: Diagnosis not present

## 2021-03-18 DIAGNOSIS — K5651 Intestinal adhesions [bands], with partial obstruction: Principal | ICD-10-CM | POA: Diagnosis present

## 2021-03-18 DIAGNOSIS — E876 Hypokalemia: Secondary | ICD-10-CM | POA: Diagnosis not present

## 2021-03-18 DIAGNOSIS — Z79899 Other long term (current) drug therapy: Secondary | ICD-10-CM

## 2021-03-18 DIAGNOSIS — K56609 Unspecified intestinal obstruction, unspecified as to partial versus complete obstruction: Secondary | ICD-10-CM | POA: Diagnosis not present

## 2021-03-18 DIAGNOSIS — R109 Unspecified abdominal pain: Secondary | ICD-10-CM | POA: Diagnosis not present

## 2021-03-18 DIAGNOSIS — F17210 Nicotine dependence, cigarettes, uncomplicated: Secondary | ICD-10-CM | POA: Diagnosis present

## 2021-03-18 DIAGNOSIS — Z4682 Encounter for fitting and adjustment of non-vascular catheter: Secondary | ICD-10-CM | POA: Diagnosis not present

## 2021-03-18 DIAGNOSIS — Z9049 Acquired absence of other specified parts of digestive tract: Secondary | ICD-10-CM

## 2021-03-18 DIAGNOSIS — K5669 Other partial intestinal obstruction: Secondary | ICD-10-CM | POA: Diagnosis not present

## 2021-03-18 DIAGNOSIS — Z0189 Encounter for other specified special examinations: Secondary | ICD-10-CM

## 2021-03-18 HISTORY — DX: Unspecified intestinal obstruction, unspecified as to partial versus complete obstruction: K56.609

## 2021-03-18 LAB — CBC WITH DIFFERENTIAL/PLATELET
Abs Immature Granulocytes: 0.02 10*3/uL (ref 0.00–0.07)
Basophils Absolute: 0 10*3/uL (ref 0.0–0.1)
Basophils Relative: 1 %
Eosinophils Absolute: 0.1 10*3/uL (ref 0.0–0.5)
Eosinophils Relative: 1 %
HCT: 34.6 % — ABNORMAL LOW (ref 36.0–46.0)
Hemoglobin: 10.9 g/dL — ABNORMAL LOW (ref 12.0–15.0)
Immature Granulocytes: 0 %
Lymphocytes Relative: 39 %
Lymphs Abs: 2.4 10*3/uL (ref 0.7–4.0)
MCH: 22.2 pg — ABNORMAL LOW (ref 26.0–34.0)
MCHC: 31.5 g/dL (ref 30.0–36.0)
MCV: 70.5 fL — ABNORMAL LOW (ref 80.0–100.0)
Monocytes Absolute: 0.4 10*3/uL (ref 0.1–1.0)
Monocytes Relative: 6 %
Neutro Abs: 3.3 10*3/uL (ref 1.7–7.7)
Neutrophils Relative %: 53 %
Platelets: 205 10*3/uL (ref 150–400)
RBC: 4.91 MIL/uL (ref 3.87–5.11)
RDW: 13.5 % (ref 11.5–15.5)
WBC: 6.1 10*3/uL (ref 4.0–10.5)
nRBC: 0 % (ref 0.0–0.2)

## 2021-03-18 LAB — COMPREHENSIVE METABOLIC PANEL
ALT: 10 U/L (ref 0–44)
AST: 17 U/L (ref 15–41)
Albumin: 4 g/dL (ref 3.5–5.0)
Alkaline Phosphatase: 92 U/L (ref 38–126)
Anion gap: 9 (ref 5–15)
BUN: 7 mg/dL (ref 6–20)
CO2: 37 mmol/L — ABNORMAL HIGH (ref 22–32)
Calcium: 9.1 mg/dL (ref 8.9–10.3)
Chloride: 89 mmol/L — ABNORMAL LOW (ref 98–111)
Creatinine, Ser: 0.86 mg/dL (ref 0.44–1.00)
GFR, Estimated: >60 mL/min (ref >60)
Glucose, Bld: 95 mg/dL (ref 70–99)
Potassium: 2.5 mmol/L — CL (ref 3.5–5.1)
Sodium: 135 mmol/L (ref 135–145)
Total Bilirubin: 0.7 mg/dL (ref 0.3–1.2)
Total Protein: 7.1 g/dL (ref 6.5–8.1)

## 2021-03-18 LAB — LIPASE, BLOOD: Lipase: 28 U/L (ref 11–51)

## 2021-03-18 LAB — BASIC METABOLIC PANEL
Anion gap: 8 (ref 5–15)
BUN: 7 mg/dL (ref 6–20)
CO2: 36 mmol/L — ABNORMAL HIGH (ref 22–32)
Calcium: 9.1 mg/dL (ref 8.9–10.3)
Chloride: 92 mmol/L — ABNORMAL LOW (ref 98–111)
Creatinine, Ser: 0.76 mg/dL (ref 0.44–1.00)
GFR, Estimated: >60 mL/min (ref >60)
Glucose, Bld: 126 mg/dL — ABNORMAL HIGH (ref 70–99)
Potassium: 3.5 mmol/L (ref 3.5–5.1)
Sodium: 136 mmol/L (ref 135–145)

## 2021-03-18 LAB — RESP PANEL BY RT-PCR (FLU A&B, COVID) ARPGX2
Influenza A by PCR: NEGATIVE
Influenza B by PCR: NEGATIVE
SARS Coronavirus 2 by RT PCR: NEGATIVE

## 2021-03-18 LAB — LACTIC ACID, PLASMA: Lactic Acid, Venous: 0.9 mmol/L (ref 0.5–1.9)

## 2021-03-18 MED ORDER — ONDANSETRON HCL 4 MG/2ML IJ SOLN
4.0000 mg | Freq: Once | INTRAMUSCULAR | Status: AC
Start: 2021-03-18 — End: 2021-03-18
  Administered 2021-03-18: 4 mg via INTRAVENOUS
  Filled 2021-03-18: qty 2

## 2021-03-18 MED ORDER — DEXTROSE-NACL 5-0.9 % IV SOLN: INTRAVENOUS | Status: DC

## 2021-03-18 MED ORDER — ONDANSETRON HCL 4 MG/2ML IJ SOLN: 4.0000 mg | Freq: Four times a day (QID) | INTRAMUSCULAR | Status: DC | PRN

## 2021-03-18 MED ORDER — POTASSIUM CHLORIDE CRYS ER 20 MEQ PO TBCR
40.0000 meq | EXTENDED_RELEASE_TABLET | Freq: Once | ORAL | Status: AC
  Administered 2021-03-18: 40 meq via ORAL
  Filled 2021-03-18: qty 2

## 2021-03-18 MED ORDER — IOHEXOL 300 MG/ML  SOLN
70.0000 mL | Freq: Once | INTRAMUSCULAR | Status: AC | PRN
  Administered 2021-03-18: 70 mL via INTRAVENOUS

## 2021-03-18 MED ORDER — MORPHINE SULFATE (PF) 2 MG/ML IV SOLN
2.0000 mg | Freq: Once | INTRAVENOUS | Status: AC
  Administered 2021-03-18: 2 mg via INTRAVENOUS
  Filled 2021-03-18: qty 1

## 2021-03-18 MED ORDER — ENOXAPARIN SODIUM 40 MG/0.4ML ~~LOC~~ SOLN
40.0000 mg | Freq: Every day | SUBCUTANEOUS | Status: DC
  Administered 2021-03-18 – 2021-03-19 (×2): 40 mg via SUBCUTANEOUS
  Filled 2021-03-18 (×2): qty 0.4

## 2021-03-18 MED ORDER — DIATRIZOATE MEGLUMINE & SODIUM 66-10 % PO SOLN
90.0000 mL | Freq: Once | ORAL | Status: AC
  Administered 2021-03-18: 90 mL via NASOGASTRIC
  Filled 2021-03-18: qty 90

## 2021-03-18 MED ORDER — ONDANSETRON 4 MG PO TBDP: 4.0000 mg | ORAL_TABLET | Freq: Four times a day (QID) | ORAL | Status: DC | PRN

## 2021-03-18 MED ORDER — POTASSIUM CHLORIDE IN NACL 20-0.9 MEQ/L-% IV SOLN
Freq: Once | INTRAVENOUS | Status: AC
  Filled 2021-03-18 (×2): qty 1000

## 2021-03-18 MED ORDER — HYDROMORPHONE HCL 1 MG/ML IJ SOLN
1.0000 mg | INTRAMUSCULAR | Status: DC | PRN
Start: 2021-03-18 — End: 2021-03-20
  Administered 2021-03-18: 1 mg via INTRAVENOUS
  Filled 2021-03-18: qty 1

## 2021-03-18 NOTE — H&P (Signed)
[name] [name] is an 60 y.o. female.   Chief Complaint: Abdominal pain HPI: Patient is a 60 year old female, with a history of bowel perforation, colectomy with colostomy and reversal in 2016.  Patient came in today secondary to increased abdominal pain that began after eating lunch.  She states the pain was mainly left-sided.  Patient states that she has had trouble with on and off pain to the left lower quadrant area ever since her colon surgery.  Patient states her last bowel movement was yesterday.  States she has not passed gas today or had a bowel movement today.  She states that she had associated nausea and vomiting.  Patient underwent CT scan in the ER was found to have dilated loop of bowel, closed bowel loop obstruction.  Patient with no emesis at this time.  Patient went laboratory studies with no leukocytosis.  General surgery was consulted for further evaluation and management.  I did review CT scans and laboratory studies personally.  Past Medical History:  Diagnosis Date  . 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 Constipation   . Difficulty sleeping   . Literacy level of illiterate 10/01/2016   Less than one year of formal education in [place].  Ms Occhipinti is taking classes for reading and writing in [place].   . S/P colostomy [facility]) 03/02/2015   March 2016: colon perforation following screening colonoscopy   . S/P colostomy [facility]) 03/02/2015   March 2016: colon perforation following screening colonoscopy     Past Surgical History:  Procedure Laterality Date  . ABDOMINAL HYSTERECTOMY  1994  . APPENDECTOMY  1993  . CESAREAN SECTION  1992  . COLONOSCOPY N/A 05/30/2015   Procedure: COLONOSCOPY;  Surgeon: Romie Levee, MD;  Location: [facility];  Service: Endoscopy;  Laterality: N/A;  . COLONOSCOPY WITH PROPOFOL N/A 06/12/2015   Procedure: COLONOSCOPY WITH PROPOFOL;  Surgeon: Romie Levee, MD;  Location: [facility];  Service: Endoscopy;  Laterality: N/A;  . COLOSTOMY  01/2015  .  COLOSTOMY TAKEDOWN N/A 07/02/2015   Procedure: LAPAROSCOPIC COLOSTOMY REVERSAL SPLENIC FLEXURE MOBILIZATION AND PARTIAL COLECTOMY;  Surgeon: Romie Levee, MD;  Location: [facility];  Service: General;  Laterality: N/A;  . DILATION AND CURETTAGE OF UTERUS  1990   "miscarriage"  . LAPAROTOMY N/A 02/20/2015   Procedure: PARTIAL COLECTOMY END  ILEOSTOMY AND HARTMAN'S PROCEDURE;  Surgeon: Romie Levee, MD;  Location: [facility];  Service: General;  Laterality: N/A;    Family History  Problem Relation Age of Onset  . Colon cancer Neg Hx    Social History:  reports that she has been smoking cigarettes. She has a 18.50 pack-year smoking history. She has never used smokeless tobacco. She reports that she does not drink alcohol and does not use drugs.  Allergies: No Known Allergies  (Not in a hospital admission)   Results for orders placed or performed during the hospital encounter of 03/18/21 (from the past 48 hour(s))  CBC with Differential     Status: Abnormal   Collection Time: 03/18/21  3:35 PM  Result Value Ref Range   WBC 6.1 4.0 - 10.5 K/uL   RBC 4.91 3.87 - 5.11 MIL/uL   Hemoglobin 10.9 (L) 12.0 - 15.0 g/dL   HCT 69.6 (L) 29.5 - 28.4 %   MCV 70.5 (L) 80.0 - 100.0 fL   MCH 22.2 (L) 26.0 - 34.0 pg   MCHC 31.5 30.0 - 36.0 g/dL   RDW 13.2 44.0 - 10.2 %   Platelets 205 150 - 400 K/uL  nRBC 0.0 0.0 - 0.2 %   Neutrophils Relative % 53 %   Neutro Abs 3.3 1.7 - 7.7 K/uL   Lymphocytes Relative 39 %   Lymphs Abs 2.4 0.7 - 4.0 K/uL   Monocytes Relative 6 %   Monocytes Absolute 0.4 0.1 - 1.0 K/uL   Eosinophils Relative 1 %   Eosinophils Absolute 0.1 0.0 - 0.5 K/uL   Basophils Relative 1 %   Basophils Absolute 0.0 0.0 - 0.1 K/uL   Immature Granulocytes 0 %   Abs Immature Granulocytes 0.02 0.00 - 0.07 K/uL    Comment: Performed at [facility], [address], [place], [place] [address]  Comprehensive metabolic panel     Status: Abnormal   Collection Time: 03/18/21  3:35 PM  Result  Value Ref Range   Sodium 135 135 - 145 mmol/L   Potassium 2.5 (LL) 3.5 - 5.1 mmol/L    Comment: CRITICAL RESULT CALLED TO, READ BACK BY AND VERIFIED WITH: K.NEWMAN,RN @1719  03/18/2021 VANG.J    Chloride 89 (L) 98 - 111 mmol/L   CO2 37 (H) 22 - 32 mmol/L   Glucose, Bld 95 70 - 99 mg/dL    Comment: Glucose reference range applies only to samples taken after fasting for at least 8 hours.   BUN 7 6 - 20 mg/dL   Creatinine, Ser 03/20/2021 0.44 - 1.00 mg/dL   Calcium 9.1 8.9 - 0.17 mg/dL   Total Protein 7.1 6.5 - 8.1 g/dL   Albumin 4.0 3.5 - 5.0 g/dL   AST 17 15 - 41 U/L   ALT 10 0 - 44 U/L   Alkaline Phosphatase 92 38 - 126 U/L   Total Bilirubin 0.7 0.3 - 1.2 mg/dL   GFR, Estimated 51.0 >25 mL/min    Comment: (NOTE) Calculated using the CKD-EPI Creatinine Equation (2021)    Anion gap 9 5 - 15    Comment: Performed at [facility], [address], [place], [place]  Lipase, blood     Status: None   Collection Time: 03/18/21  3:35 PM  Result Value Ref Range   Lipase 28 11 - 51 U/L    Comment: Performed at [facility], [address], [place], [place]  Basic metabolic panel     Status: Abnormal   Collection Time: 03/18/21  8:27 PM  Result Value Ref Range   Sodium 136 135 - 145 mmol/L   Potassium 3.5 3.5 - 5.1 mmol/L   Chloride 92 (L) 98 - 111 mmol/L   CO2 36 (H) 22 - 32 mmol/L   Glucose, Bld 126 (H) 70 - 99 mg/dL    Comment: Glucose reference range applies only to samples taken after fasting for at least 8 hours.   BUN 7 6 - 20 mg/dL   Creatinine, Ser 03/20/21 0.44 - 1.00 mg/dL   Calcium 9.1 8.9 - 6.14 mg/dL   GFR, Estimated 43.1 >54 mL/min    Comment: (NOTE) Calculated using the CKD-EPI Creatinine Equation (2021)    Anion gap 8 5 - 15    Comment: Performed at [facility], [address], [place], [place]  Lactic acid, plasma     Status: None   Collection Time: 03/18/21  8:57 PM  Result Value Ref Range   Lactic Acid, Venous 0.9 0.5 - 1.9  mmol/L    Comment: Performed at [facility], [address] [place], [place]   CT Abdomen  Pelvis W Contrast  Result Date: 03/18/2021 CLINICAL DATA:  60 year old female with abdominal pain. EXAM: CT ABDOMEN AND PELVIS WITH CONTRAST TECHNIQUE: Multidetector CT imaging of the abdomen and pelvis was performed using the standard protocol following bolus administration of intravenous contrast. CONTRAST:  70mL OMNIPAQUE IOHEXOL 300 MG/ML  SOLN COMPARISON:  CT abdomen pelvis dated 11/08/2020. FINDINGS: Lower chest: The visualized lung bases are clear. No intra-abdominal free air. Small free fluid in the pelvis. Hepatobiliary: No focal liver abnormality is seen. No gallstones, gallbladder wall thickening, or biliary dilatation. Pancreas: Unremarkable. No pancreatic ductal dilatation or surrounding inflammatory changes. Spleen: There is a 17 mm focus of coarse calcification in the spleen, likely sequela of prior insult. Adrenals/Urinary Tract: The adrenal glands unremarkable. There is no hydronephrosis on either side. There is symmetric enhancement and excretion of contrast by both kidneys. The visualized ureters and urinary bladder appear unremarkable. Stomach/Bowel: There is postsurgical changes of partial sigmoid resection with anastomotic suture. There is dilatation of a loop of small bowel in the lower abdomen. There is moderate engorgement of the associated mesentery. There is a transition in the lower abdomen (49/4 and coronal 38/7) likely secondary to adhesion. A second transition is noted inferiorly (coronal 52/7 and axial 55/4). Findings findings concerning for a closed loop obstruction. Clinical correlation and surgical consult is advised. No perforation or evidence of pneumatosis at this time. The appendix is normal. Vascular/Lymphatic: Mild aortoiliac atherosclerotic disease. The IVC is unremarkable. No portal venous gas. There is no adenopathy. Reproductive: Hysterectomy. No adnexal  masses. Other: None Musculoskeletal: Osteopenia. No acute osseous pathology. IMPRESSION: Small-bowel obstruction in the lower abdomen with findings concerning for a closed loop obstruction. Clinical correlation and surgical consult is advised. No perforation or evidence of pneumatosis at this time. Electronically Signed   By: Elgie CollardArash  Radparvar M.D.   On: 03/18/2021 20:26    Review of Systems  Constitutional: Negative for chills and fever.  HENT: Negative for ear discharge, hearing loss and sore throat.   Eyes: Negative for discharge.  Respiratory: Negative for cough and shortness of breath.   Cardiovascular: Negative for chest pain and leg swelling.  Gastrointestinal: Positive for abdominal pain, constipation, nausea and vomiting. Negative for diarrhea.  Musculoskeletal: Negative for myalgias and neck pain.  Skin: Negative for rash.  Allergic/Immunologic: Negative for environmental allergies.  Neurological: Negative for dizziness and seizures.  Hematological: Does not bruise/bleed easily.  Psychiatric/Behavioral: Negative for suicidal ideas.  All other systems reviewed and are negative.   Blood pressure 131/82, pulse 72, temperature 98.6 F (37 C), temperature source Oral, resp. rate 18, SpO2 97 %. Physical Exam Constitutional:      Appearance: She is well-developed.     Comments: Conversant No acute distress  Eyes:     General: Lids are normal. No scleral icterus.    Comments: Pupils are equal round and reactive No lid lag Moist conjunctiva  Neck:     Thyroid: No thyromegaly.     Trachea: No tracheal tenderness.     Comments: No cervical lymphadenopathy Cardiovascular:     Rate and Rhythm: Normal rate and regular rhythm.     Heart sounds: No murmur heard.   Pulmonary:     Effort: Pulmonary effort is normal.     Breath sounds: Normal breath sounds. No wheezing or rales.  Abdominal:     Tenderness: There is abdominal tenderness (LLQ). There is no guarding or rebound.      Hernia: No hernia is present.    Skin:  General: Skin is warm.     Findings: No rash.     Nails: There is no clubbing.     Comments: Normal skin turgor  Neurological:     Mental Status: She is alert and oriented to person, place, and time.     Comments: Normal gait and station  Psychiatric:        Judgment: Judgment normal.     Comments: Appropriate affect      Assessment/Plan 60 year old female with likely SBO secondary to adhesions. 1.  We will admit to the hospital for SBO protocol. 2.  Continue n.p.o., IV fluids. 3.  Discussed with the patient and her daughter that if in the next 24 to 48 hours her abdomen is not better in her bowel obstruction does not resolve she may require surgery.  Axel Filler, MD 03/18/2021, 10:00 PM

## 2021-03-18 NOTE — ED Notes (Signed)
Lab called with Potassium of 2.5.  Pt in lobby and moved to next treatment room.  Dr. Audley Hose notified of critical lab results.

## 2021-03-18 NOTE — ED Provider Notes (Signed)
[facility] EMERGENCY DEPARTMENT Provider Note   CSN: [id number] Arrival date & time: 03/18/21  1506     History No chief complaint on file.   [name] [name] is a 60 y.o. female.  Patient presents ER chief complaint of left-sided abdominal pain described as sharp and persistent.  Does not radiate anywhere else.  She states this started yesterday.  Associated with sensation of nausea but no vomiting.  No fevers no cough.  No diarrhea.  History is complicated with history of colon perforation with colostomy and subsequent takedown in 2016.  She states she had a bowel obstruction in the past as well.          Past Medical History:  Diagnosis Date  . 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 Constipation   . Difficulty sleeping   . Literacy level of illiterate 10/01/2016   Less than one year of formal education in [place].  [name] [name] is taking classes for reading and writing in [place].   . S/P colostomy [facility]) 03/02/2015   March 2016: colon perforation following screening colonoscopy   . S/P colostomy [facility]) 03/02/2015   March 2016: colon perforation following screening colonoscopy     Patient Active Problem List   Diagnosis Date Noted  . Diarrhea 11/13/2020  . Atypical chest pain 08/07/2020  . Cough 08/07/2020  . Language barrier affecting health care 08/07/2020  . Hypokalemia 06/27/2020  . Annual physical exam 06/27/2020  . Headache 07/18/2018  . Literacy level of illiterate 10/01/2016  . Reduced visual acuity 09/30/2016  . Anemia 07/27/2016    Past Surgical History:  Procedure Laterality Date  . ABDOMINAL HYSTERECTOMY  1994  . APPENDECTOMY  1993  . CESAREAN SECTION  1992  . COLONOSCOPY N/A 05/30/2015   Procedure: COLONOSCOPY;  Surgeon: Romie Levee, MD;  [facility];  Service: Endoscopy;  Laterality: N/A;  . COLONOSCOPY WITH PROPOFOL N/A 06/12/2015   Procedure: COLONOSCOPY WITH PROPOFOL;  Surgeon: Romie Levee, MD;  [facility];   Service: Endoscopy;  Laterality: N/A;  . COLOSTOMY  01/2015  . COLOSTOMY TAKEDOWN N/A 07/02/2015   Procedure: LAPAROSCOPIC COLOSTOMY REVERSAL SPLENIC FLEXURE MOBILIZATION AND PARTIAL COLECTOMY;  Surgeon: Romie Levee, MD;  Location: [facility];  Service: General;  Laterality: N/A;  . DILATION AND CURETTAGE OF UTERUS  1990   "miscarriage"  . LAPAROTOMY N/A 02/20/2015   Procedure: PARTIAL COLECTOMY END  ILEOSTOMY AND HARTMAN'S PROCEDURE;  Surgeon: Romie Levee, MD;  Location: [facility];  Service: General;  Laterality: N/A;     OB History   No obstetric history on file.     Family History  Problem Relation Age of Onset  . Colon cancer Neg Hx     Social History   Tobacco Use  . Smoking status: Current Every Day Smoker    Packs/day: 0.50    Years: 37.00    Pack years: 18.50    Types: Cigarettes  . Smokeless tobacco: Never Used  Substance Use Topics  . Alcohol use: No    Alcohol/week: 0.0 standard drinks  . Drug use: No    Home Medications Prior to Admission medications   Medication Sig Start Date End Date Taking? Authorizing Provider  albuterol (VENTOLIN HFA) 108 (90 Base) MCG/ACT inhaler Inhale 2 puffs into the lungs every 6 (six) hours as needed for wheezing or shortness of breath. 08/07/20   Brimage, Seward Meth, DO  benzonatate (TESSALON PERLES) 100 MG capsule Take 2 capsules (200 mg total) by mouth 3 (three) times daily as  needed for cough. 08/07/20   Brimage, Seward Meth, DO  ibuprofen (ADVIL) 600 MG tablet Take 1 tablet (600 mg total) by mouth every 8 (eight) hours as needed. 11/13/20   Fayette Pho, MD  potassium chloride 20 MEQ TBCR Take 20 mEq by mouth daily for 4 days. 11/08/20 11/12/20  Maxwell Caul, PA-C    Allergies    Patient has no known allergies.  Review of Systems   Review of Systems  Constitutional: Negative for fever.  HENT: Negative for ear pain.   Eyes: Negative for pain.  Respiratory: Negative for cough.   Cardiovascular: Negative for chest pain.   Gastrointestinal: Positive for abdominal pain.  Genitourinary: Negative for flank pain.  Musculoskeletal: Negative for back pain.  Skin: Negative for rash.  Neurological: Negative for headaches.    Physical Exam Updated Vital Signs BP 127/83   Pulse 69   Temp 98.6 F (37 C) (Oral)   Resp 18   SpO2 97%   Physical Exam Constitutional:      General: She is not in acute distress.    Appearance: Normal appearance.  HENT:     Head: Normocephalic.     Nose: Nose normal.  Eyes:     Extraocular Movements: Extraocular movements intact.  Cardiovascular:     Rate and Rhythm: Normal rate.  Pulmonary:     Effort: Pulmonary effort is normal.  Abdominal:     Tenderness: There is abdominal tenderness.     Comments: Diffuse mid to lower abdominal tenderness present.  Musculoskeletal:        General: Normal range of motion.     Cervical back: Normal range of motion.  Neurological:     General: No focal deficit present.     Mental Status: She is alert. Mental status is at baseline.     ED Results / Procedures / Treatments   Labs (all labs ordered are listed, but only abnormal results are displayed) Labs Reviewed  CBC WITH DIFFERENTIAL/PLATELET - Abnormal; Notable for the following components:      Result Value   Hemoglobin 10.9 (*)    HCT 34.6 (*)    MCV 70.5 (*)    MCH 22.2 (*)    All other components within normal limits  COMPREHENSIVE METABOLIC PANEL - Abnormal; Notable for the following components:   Potassium 2.5 (*)    Chloride 89 (*)    CO2 37 (*)    All other components within normal limits  RESP PANEL BY RT-PCR (FLU A&B, COVID) ARPGX2  LIPASE, BLOOD  URINALYSIS, ROUTINE W REFLEX MICROSCOPIC  BASIC METABOLIC PANEL  LACTIC ACID, PLASMA  LACTIC ACID, PLASMA    EKG None  Radiology CT Abdomen Pelvis W Contrast  Result Date: 03/18/2021 CLINICAL DATA:  60 year old female with abdominal pain. EXAM: CT ABDOMEN AND PELVIS WITH CONTRAST TECHNIQUE: Multidetector CT  imaging of the abdomen and pelvis was performed using the standard protocol following bolus administration of intravenous contrast. CONTRAST:  10mL OMNIPAQUE IOHEXOL 300 MG/ML  SOLN COMPARISON:  CT abdomen pelvis dated 11/08/2020. FINDINGS: Lower chest: The visualized lung bases are clear. No intra-abdominal free air. Small free fluid in the pelvis. Hepatobiliary: No focal liver abnormality is seen. No gallstones, gallbladder wall thickening, or biliary dilatation. Pancreas: Unremarkable. No pancreatic ductal dilatation or surrounding inflammatory changes. Spleen: There is a 17 mm focus of coarse calcification in the spleen, likely sequela of prior insult. Adrenals/Urinary Tract: The adrenal glands unremarkable. There is no hydronephrosis on either side. There is symmetric enhancement  and excretion of contrast by both kidneys. The visualized ureters and urinary bladder appear unremarkable. Stomach/Bowel: There is postsurgical changes of partial sigmoid resection with anastomotic suture. There is dilatation of a loop of small bowel in the lower abdomen. There is moderate engorgement of the associated mesentery. There is a transition in the lower abdomen (49/4 and coronal 38/7) likely secondary to adhesion. A second transition is noted inferiorly (coronal 52/7 and axial 55/4). Findings findings concerning for a closed loop obstruction. Clinical correlation and surgical consult is advised. No perforation or evidence of pneumatosis at this time. The appendix is normal. Vascular/Lymphatic: Mild aortoiliac atherosclerotic disease. The IVC is unremarkable. No portal venous gas. There is no adenopathy. Reproductive: Hysterectomy. No adnexal masses. Other: None Musculoskeletal: Osteopenia. No acute osseous pathology. IMPRESSION: Small-bowel obstruction in the lower abdomen with findings concerning for a closed loop obstruction. Clinical correlation and surgical consult is advised. No perforation or evidence of pneumatosis at  this time. Electronically Signed   By: Elgie Collard M.D.   On: 03/18/2021 20:26    Procedures Procedures   Medications Ordered in ED Medications  potassium chloride SA (KLOR-CON) CR tablet 40 mEq (40 mEq Oral Given 03/18/21 1814)  0.9 % NaCl with KCl 20 mEq/ L  infusion ( Intravenous New Bag/Given 03/18/21 1859)  ondansetron (ZOFRAN) injection 4 mg (4 mg Intravenous Given 03/18/21 1827)  morphine 2 MG/ML injection 2 mg (2 mg Intravenous Given 03/18/21 1829)  iohexol (OMNIPAQUE) 300 MG/ML solution 70 mL (70 mLs Intravenous Contrast Given 03/18/21 1946)    ED Course  I have reviewed the triage vital signs and the nursing notes.  Pertinent labs & imaging results that were available during my care of the patient were reviewed by me and considered in my medical decision making (see chart for details).    MDM Rules/Calculators/A&P                          Labs show abnormal potassium of 2.5.  Patient given oral and IV potassium replacement.  CT abdomen pelvis pursued.  Imaging is concerning for closed-loop bowel obstruction.  Surgical consultation requested and case discussed with surgery at 8:45 PM.  Recommending NG tube and lactic acid levels to be sent.  Patient will be admitted for bowel obstruction.   Final Clinical Impression(s) / ED Diagnoses Final diagnoses:  Small bowel obstruction [facility])    Rx / DC Orders ED Discharge Orders    None       Cheryll Cockayne, MD 03/18/21 2050

## 2021-03-18 NOTE — ED Triage Notes (Signed)
Emergency Medicine Provider Triage Evaluation Note  [name] [name] , a 60 y.o. female  was evaluated in triage.  Pt complains of left side abdominal pain, sent by PCP, onset today, no vomiting, no changes in bowel or bladder.  Review of Systems  Positive: Abdominal pain Negative: Vomiting, changes in bowel or bladder, fever  Physical Exam  BP (!) 168/109 (BP Location: Right Arm)   Pulse 63   Temp 98.6 F (37 C) (Oral)   Resp (!) 22   SpO2 100%  Gen:   Awake, no distress   HEENT:  Atraumatic  Resp:  Normal effort  Cardiac:  Normal rate  Abd:   Nondistended, generalized tenderness MSK:   Moves extremities without difficulty  Neuro:  Speech clear   Medical Decision Making  Medically screening exam initiated at 3:30 PM.  Appropriate orders placed.  [name] [name] was informed that the remainder of the evaluation will be completed by another provider, this initial triage assessment does not replace that evaluation, and the importance of remaining in the ED until their evaluation is complete.  Clinical Impression     Jeannie Fend, PA-C 03/18/21 1534

## 2021-03-18 NOTE — ED Triage Notes (Signed)
Pt here sent down by UC for left sided abd pain and nausea

## 2021-03-18 NOTE — ED Notes (Signed)
PT vomited after taking K pills

## 2021-03-18 NOTE — ED Triage Notes (Signed)
Pt presents today with c/o of Left abdominal pain, +nausea that began at 12:00 today.

## 2021-03-19 ENCOUNTER — Inpatient Hospital Stay (HOSPITAL_COMMUNITY): Payer: 59

## 2021-03-19 ENCOUNTER — Other Ambulatory Visit: Payer: Self-pay

## 2021-03-19 ENCOUNTER — Ambulatory Visit: Payer: 59 | Admitting: Student in an Organized Health Care Education/Training Program

## 2021-03-19 LAB — BASIC METABOLIC PANEL
Anion gap: 5 (ref 5–15)
Anion gap: 3 — ABNORMAL LOW (ref 5–15)
BUN: 7 mg/dL (ref 6–20)
BUN: 5 mg/dL — ABNORMAL LOW (ref 6–20)
CO2: 35 mmol/L — ABNORMAL HIGH (ref 22–32)
CO2: 32 mmol/L (ref 22–32)
Calcium: 8.6 mg/dL — ABNORMAL LOW (ref 8.9–10.3)
Calcium: 8.3 mg/dL — ABNORMAL LOW (ref 8.9–10.3)
Chloride: 100 mmol/L (ref 98–111)
Chloride: 105 mmol/L (ref 98–111)
Creatinine, Ser: 0.62 mg/dL (ref 0.44–1.00)
Creatinine, Ser: 0.51 mg/dL (ref 0.44–1.00)
GFR, Estimated: >60 mL/min (ref >60)
GFR, Estimated: >60 mL/min (ref >60)
Glucose, Bld: 128 mg/dL — ABNORMAL HIGH (ref 70–99)
Glucose, Bld: 120 mg/dL — ABNORMAL HIGH (ref 70–99)
Potassium: 2.6 mmol/L — CL (ref 3.5–5.1)
Potassium: 3.1 mmol/L — ABNORMAL LOW (ref 3.5–5.1)
Sodium: 140 mmol/L (ref 135–145)
Sodium: 140 mmol/L (ref 135–145)

## 2021-03-19 LAB — MAGNESIUM: Magnesium: 2 mg/dL (ref 1.7–2.4)

## 2021-03-19 LAB — CBC
HCT: 33.8 % — ABNORMAL LOW (ref 36.0–46.0)
Hemoglobin: 10.4 g/dL — ABNORMAL LOW (ref 12.0–15.0)
MCH: 21.8 pg — ABNORMAL LOW (ref 26.0–34.0)
MCHC: 30.8 g/dL (ref 30.0–36.0)
MCV: 70.9 fL — ABNORMAL LOW (ref 80.0–100.0)
Platelets: 181 10*3/uL (ref 150–400)
RBC: 4.77 MIL/uL (ref 3.87–5.11)
RDW: 13.6 % (ref 11.5–15.5)
WBC: 9 10*3/uL (ref 4.0–10.5)
nRBC: 0 % (ref 0.0–0.2)

## 2021-03-19 LAB — PHOSPHORUS: Phosphorus: 3.7 mg/dL (ref 2.5–4.6)

## 2021-03-19 LAB — LACTIC ACID, PLASMA: Lactic Acid, Venous: 1.1 mmol/L (ref 0.5–1.9)

## 2021-03-19 MED ORDER — POTASSIUM CHLORIDE 20 MEQ PO PACK
40.0000 meq | PACK | Freq: Once | ORAL | Status: AC
  Administered 2021-03-19: 40 meq via ORAL
  Filled 2021-03-19: qty 2

## 2021-03-19 MED ORDER — BOOST / RESOURCE BREEZE PO LIQD CUSTOM
1.0000 | Freq: Three times a day (TID) | ORAL | Status: DC
  Administered 2021-03-19 – 2021-03-20 (×4): 1 via ORAL

## 2021-03-19 MED ORDER — ACETAMINOPHEN 325 MG PO TABS: 650.0000 mg | ORAL_TABLET | Freq: Four times a day (QID) | ORAL | Status: DC | PRN

## 2021-03-19 MED ORDER — POTASSIUM CHLORIDE 10 MEQ/100ML IV SOLN
10.0000 meq | INTRAVENOUS | Status: AC
  Administered 2021-03-19 (×4): 10 meq via INTRAVENOUS
  Filled 2021-03-19 (×4): qty 100

## 2021-03-19 NOTE — ED Notes (Signed)
Unclamp 0000

## 2021-03-19 NOTE — Progress Notes (Signed)
Progress Note     Subjective: Patient reports mild abdominal pain that is improving from admission. She denies nausea or vomiting. She had a watery BM and is passing flatus.   Objective: Vital signs in last 24 hours: Temp:  [97.7 F (36.5 C)-98.6 F (37 C)] 98.3 F (36.8 C) (04/28 0830) Pulse Rate:  [59-72] 67 (04/28 0830) Resp:  [13-25] 16 (04/28 0830) BP: (114-170)/(71-124) 123/78 (04/28 0830) SpO2:  [97 %-100 %] 97 % (04/28 0830) Last BM Date:  (pta)  Intake/Output from previous day: No intake/output data recorded. Intake/Output this shift: No intake/output data recorded.  PE: General: pleasant, WD, thin female who is laying in bed in NAD HEENT: head is normocephalic, atraumatic.  Sclera are noninjected. Ears and nose without any masses or lesions.  Mouth is pink and dry Heart: regular, rate, and rhythm.  Normal s1,s2. No obvious murmurs, gallops, or rubs noted.   Lungs: CTAB, no wheezes, rhonchi, or rales noted.  Respiratory effort nonlabored Abd: soft, NT, ND, +BS, surgical scars well healed  MS: all 4 extremities are symmetrical with no cyanosis, clubbing, or edema. Skin: warm and dry with no masses, lesions, or rashes Neuro: Cranial nerves 2-12 grossly intact, sensation is normal throughout Psych: A&Ox3 with an appropriate affect.    Lab Results:  Recent Labs    03/18/21 1535 03/19/21 0051  WBC 6.1 9.0  HGB 10.9* 10.4*  HCT 34.6* 33.8*  PLT 205 181   BMET Recent Labs    03/18/21 2027 03/19/21 0051  NA 136 140  K 3.5 2.6*  CL 92* 100  CO2 36* 35*  GLUCOSE 126* 128*  BUN 7 7  CREATININE 0.76 0.62  CALCIUM 9.1 8.6*   PT/INR No results for input(s): LABPROT, INR in the last 72 hours. CMP     Component Value Date/Time   NA 140 03/19/2021 0051   NA 139 11/13/2020 1656   K 2.6 (LL) 03/19/2021 0051   CL 100 03/19/2021 0051   CO2 35 (H) 03/19/2021 0051   GLUCOSE 128 (H) 03/19/2021 0051   BUN 7 03/19/2021 0051   BUN 8 11/13/2020 1656    CREATININE 0.62 03/19/2021 0051   CREATININE 0.57 12/10/2013 1103   CALCIUM 8.6 (L) 03/19/2021 0051   PROT 7.1 03/18/2021 1535   PROT 6.8 11/13/2020 1656   ALBUMIN 4.0 03/18/2021 1535   ALBUMIN 4.4 11/13/2020 1656   AST 17 03/18/2021 1535   ALT 10 03/18/2021 1535   ALKPHOS 92 03/18/2021 1535   BILITOT 0.7 03/18/2021 1535   BILITOT 0.3 11/13/2020 1656   GFRNONAA >60 03/19/2021 0051   GFRAA 114 11/13/2020 1656   Lipase     Component Value Date/Time   LIPASE 28 03/18/2021 1535       Studies/Results: CT Abdomen Pelvis W Contrast  Result Date: 03/18/2021 CLINICAL DATA:  60 year old female with abdominal pain. EXAM: CT ABDOMEN AND PELVIS WITH CONTRAST TECHNIQUE: Multidetector CT imaging of the abdomen and pelvis was performed using the standard protocol following bolus administration of intravenous contrast. CONTRAST:  52mL OMNIPAQUE IOHEXOL 300 MG/ML  SOLN COMPARISON:  CT abdomen pelvis dated 11/08/2020. FINDINGS: Lower chest: The visualized lung bases are clear. No intra-abdominal free air. Small free fluid in the pelvis. Hepatobiliary: No focal liver abnormality is seen. No gallstones, gallbladder wall thickening, or biliary dilatation. Pancreas: Unremarkable. No pancreatic ductal dilatation or surrounding inflammatory changes. Spleen: There is a 17 mm focus of coarse calcification in the spleen, likely sequela of prior insult. Adrenals/Urinary Tract:  The adrenal glands unremarkable. There is no hydronephrosis on either side. There is symmetric enhancement and excretion of contrast by both kidneys. The visualized ureters and urinary bladder appear unremarkable. Stomach/Bowel: There is postsurgical changes of partial sigmoid resection with anastomotic suture. There is dilatation of a loop of small bowel in the lower abdomen. There is moderate engorgement of the associated mesentery. There is a transition in the lower abdomen (49/4 and coronal 38/7) likely secondary to adhesion. A second  transition is noted inferiorly (coronal 52/7 and axial 55/4). Findings findings concerning for a closed loop obstruction. Clinical correlation and surgical consult is advised. No perforation or evidence of pneumatosis at this time. The appendix is normal. Vascular/Lymphatic: Mild aortoiliac atherosclerotic disease. The IVC is unremarkable. No portal venous gas. There is no adenopathy. Reproductive: Hysterectomy. No adnexal masses. Other: None Musculoskeletal: Osteopenia. No acute osseous pathology. IMPRESSION: Small-bowel obstruction in the lower abdomen with findings concerning for a closed loop obstruction. Clinical correlation and surgical consult is advised. No perforation or evidence of pneumatosis at this time. Electronically Signed   By: Elgie Collard M.D.   On: 03/18/2021 20:26   DG Chest Portable 1 View  Result Date: 03/18/2021 CLINICAL DATA:  Nasogastric tube placement. EXAM: PORTABLE CHEST 1 VIEW COMPARISON:  January 29 2020 and same day CT abdomen pelvis. FINDINGS: Nasogastric tube coursing below the diaphragm with side port overlying a gaseously distended stomach and tip obscured by collimation. The heart size and mediastinal contours are within normal limits. Both lungs are clear. The visualized skeletal structures are unremarkable. IMPRESSION: 1. No active cardiopulmonary disease. 2. Nasogastric tube coursing below the diaphragm with side port overlying a gaseously distended stomach and tip obscured by collimation. Electronically Signed   By: Maudry Mayhew MD   On: 03/18/2021 22:26   DG Abd Portable 1V-Small Bowel Obstruction Protocol-initial, 8 hr delay  Result Date: 03/19/2021 CLINICAL DATA:  8 hour delay for evaluation of small-bowel obstruction. EXAM: PORTABLE ABDOMEN - 1 VIEW COMPARISON:  03/18/2021. FINDINGS: NG tube noted with tip over the stomach. Oral contrast noted within nondilated small bowel loops and throughout the colon. No bowel distention. No free air. Contrast again noted  the bladder. IMPRESSION: NG tube noted with tip over stomach. Oral contrast noted within nondilated loops of small bowel and throughout the colon. No bowel distention. Electronically Signed   By: Maisie Fus  Register   On: 03/19/2021 06:36   DG Abd Portable 1V-Small Bowel Protocol-Position Verification  Result Date: 03/18/2021 CLINICAL DATA:  NG tube placement EXAM: PORTABLE ABDOMEN - 1 VIEW COMPARISON:  CT 03/18/2021 FINDINGS: An enteric tube is been placed with tip in the left upper quadrant consistent with location in the body of the stomach. Residual contrast material in the bladder. Calcification in the right mid abdomen has been present since previous CT of 02/26/2015, possibly dystrophic. Visualized bones and soft tissue contours appear intact. Gas-filled colon. No small bowel or colonic distention is identified. IMPRESSION: Enteric tube tip is in the left upper quadrant consistent with location in the body of the stomach. Electronically Signed   By: Burman Nieves M.D.   On: 03/18/2021 22:32    Anti-infectives: Anti-infectives (From admission, onward)   None       Assessment/Plan Hx of colostomy s/p takedown  Constipation  Hypokalemia - K 2.6 this AM, getting IV and PO replacement, recheck BMET 1200  SBO - contrast in colon on 8h delay film and patient had a BM - removed NGT, start CLD - mobilize  as tolerated - if continuing to improve, likely home tomorrow  - if worsening pain or n/v, may warrant surgical exploration   FEN: CLD, IVF VTE: SCDs, lovenox ID: no current abx  LOS: 1 day    Juliet Rude, [facility] 03/19/2021, 8:52 AM Please see Amion for pager number during day hours 7:00am-4:30pm

## 2021-03-19 NOTE — Progress Notes (Signed)
Initial Nutrition Assessment  DOCUMENTATION CODES:   Non-severe (moderate) malnutrition in context of acute illness/injury  INTERVENTION:    Boost Breeze po TID, each supplement provides 250 kcal and 9 grams of protein  NUTRITION DIAGNOSIS:   Moderate Malnutrition related to acute illness (SBO) as evidenced by mild fat depletion,mild muscle depletion,moderate muscle depletion,severe muscle depletion.  GOAL:   Patient will meet greater than or equal to 90% of their needs  MONITOR:   Diet advancement,PO intake,Supplement acceptance  REASON FOR ASSESSMENT:   Malnutrition Screening Tool    ASSESSMENT:   60 yo female admitted with SBO. PMH includes constipation, colostomy S/P takedown.   Patient's daughter in room with patient. Patient reports improvement in abdominal pain. Daughter thinks patient has lost weight, but unsure of amount. She thinks her usual weight is ~80-85 lbs. Weight history reviewed. No current weight available. Most recent weight 34.4 kg on 11/13/20.  NG tube in place to LIS.  Patient has been NPO, diet advanced to clear liquids today. Advancing to full liquids this afternoon and soft diet tomorrow morning.   Labs reviewed. K 2.6 Medications reviewed. IVF: D5 NS at 75 ml/h   NUTRITION - FOCUSED PHYSICAL EXAM:  Flowsheet Row Most Recent Value  Orbital Region Mild depletion  Upper Arm Region No depletion  Thoracic and Lumbar Region Mild depletion  Buccal Region Mild depletion  Temple Region Severe depletion  Clavicle Bone Region Severe depletion  Clavicle and Acromion Bone Region Severe depletion  Scapular Bone Region Moderate depletion  Dorsal Hand Mild depletion  Patellar Region Unable to assess  Anterior Thigh Region Unable to assess  Posterior Calf Region Unable to assess  Edema (RD Assessment) Unable to assess  Hair Reviewed  Eyes Reviewed  Mouth Reviewed  Skin Reviewed  Nails Reviewed       Diet Order:   Diet Order             DIET SOFT Room service appropriate? Yes; Fluid consistency: Thin  Diet effective 0500 tomorrow           Diet full liquid Room service appropriate? Yes; Fluid consistency: Thin  Diet effective 1400           Diet clear liquid Room service appropriate? Yes; Fluid consistency: Thin  Diet effective now                 EDUCATION NEEDS:   No education needs have been identified at this time  Skin:  Skin Assessment: Reviewed RN Assessment  Last BM:  No BM documented  Height:   Ht Readings from Last 1 Encounters:  11/13/20 4\' 11"  (1.499 m)    Weight:   Wt Readings from Last 1 Encounters:  11/13/20 34.4 kg    Estimated Nutritional Needs:   Kcal:  1300-1500  Protein:  50-65 gm  Fluid:  >/= 1.5 L    11/15/20, RD, LDN, CNSC Please refer to Amion for contact information.

## 2021-03-20 DIAGNOSIS — Z20822 Contact with and (suspected) exposure to covid-19: Secondary | ICD-10-CM | POA: Diagnosis not present

## 2021-03-20 DIAGNOSIS — K5669 Other partial intestinal obstruction: Secondary | ICD-10-CM | POA: Diagnosis not present

## 2021-03-20 DIAGNOSIS — K5651 Intestinal adhesions [bands], with partial obstruction: Secondary | ICD-10-CM | POA: Diagnosis not present

## 2021-03-20 DIAGNOSIS — F17210 Nicotine dependence, cigarettes, uncomplicated: Secondary | ICD-10-CM | POA: Diagnosis not present

## 2021-03-20 DIAGNOSIS — Z9049 Acquired absence of other specified parts of digestive tract: Secondary | ICD-10-CM | POA: Diagnosis not present

## 2021-03-20 DIAGNOSIS — E876 Hypokalemia: Secondary | ICD-10-CM | POA: Diagnosis not present

## 2021-03-20 DIAGNOSIS — Z79899 Other long term (current) drug therapy: Secondary | ICD-10-CM | POA: Diagnosis not present

## 2021-03-20 LAB — CBC
HCT: 27.5 % — ABNORMAL LOW (ref 36.0–46.0)
Hemoglobin: 8.6 g/dL — ABNORMAL LOW (ref 12.0–15.0)
MCH: 22.3 pg — ABNORMAL LOW (ref 26.0–34.0)
MCHC: 31.3 g/dL (ref 30.0–36.0)
MCV: 71.2 fL — ABNORMAL LOW (ref 80.0–100.0)
Platelets: 181 10*3/uL (ref 150–400)
RBC: 3.86 MIL/uL — ABNORMAL LOW (ref 3.87–5.11)
RDW: 13.9 % (ref 11.5–15.5)
WBC: 5.7 10*3/uL (ref 4.0–10.5)
nRBC: 0 % (ref 0.0–0.2)

## 2021-03-20 LAB — BASIC METABOLIC PANEL
Anion gap: 6 (ref 5–15)
Anion gap: 4 — ABNORMAL LOW (ref 5–15)
BUN: 5 mg/dL — ABNORMAL LOW (ref 6–20)
BUN: 7 mg/dL (ref 6–20)
CO2: 29 mmol/L (ref 22–32)
CO2: 29 mmol/L (ref 22–32)
Calcium: 8.4 mg/dL — ABNORMAL LOW (ref 8.9–10.3)
Calcium: 8.5 mg/dL — ABNORMAL LOW (ref 8.9–10.3)
Chloride: 105 mmol/L (ref 98–111)
Chloride: 105 mmol/L (ref 98–111)
Creatinine, Ser: 0.47 mg/dL (ref 0.44–1.00)
Creatinine, Ser: 0.47 mg/dL (ref 0.44–1.00)
GFR, Estimated: >60 mL/min (ref >60)
GFR, Estimated: >60 mL/min (ref >60)
Glucose, Bld: 91 mg/dL (ref 70–99)
Glucose, Bld: 89 mg/dL (ref 70–99)
Potassium: 2.8 mmol/L — ABNORMAL LOW (ref 3.5–5.1)
Potassium: 4.6 mmol/L (ref 3.5–5.1)
Sodium: 140 mmol/L (ref 135–145)
Sodium: 138 mmol/L (ref 135–145)

## 2021-03-20 MED ORDER — POTASSIUM CHLORIDE 10 MEQ/100ML IV SOLN
10.0000 meq | INTRAVENOUS | Status: AC
  Administered 2021-03-20: 10 meq via INTRAVENOUS
  Filled 2021-03-20 (×2): qty 100

## 2021-03-20 MED ORDER — POTASSIUM CHLORIDE CRYS ER 20 MEQ PO TBCR
40.0000 meq | EXTENDED_RELEASE_TABLET | Freq: Two times a day (BID) | ORAL | Status: DC
  Administered 2021-03-20: 40 meq via ORAL
  Filled 2021-03-20: qty 2

## 2021-03-20 MED ORDER — POTASSIUM CHLORIDE ER 20 MEQ PO TBCR: 20.0000 meq | EXTENDED_RELEASE_TABLET | Freq: Two times a day (BID) | ORAL | 0 refills | Status: DC

## 2021-03-20 MED ORDER — POTASSIUM CHLORIDE ER 10 MEQ PO TBCR
20.0000 meq | EXTENDED_RELEASE_TABLET | Freq: Every day | ORAL | Status: DC
  Filled 2021-03-20: qty 2

## 2021-03-20 NOTE — Discharge Summary (Signed)
[facility] Discharge Summary   Patient ID: [name] [name] MRN: [id number] DOB/AGE: 59-Oct-1962 59 y.o.  Admit date: 03/18/2021 Discharge date: 03/20/2021  Admitting Diagnosis: Partial SBO  Discharge Diagnosis Partial SBO Hypokalemia   Consultants None   Imaging: CT Abdomen Pelvis W Contrast  Result Date: 03/18/2021 CLINICAL DATA:  60 year old female with abdominal pain. EXAM: CT ABDOMEN AND PELVIS WITH CONTRAST TECHNIQUE: Multidetector CT imaging of the abdomen and pelvis was performed using the standard protocol following bolus administration of intravenous contrast. CONTRAST:  42mL OMNIPAQUE IOHEXOL 300 MG/ML  SOLN COMPARISON:  CT abdomen pelvis dated 11/08/2020. FINDINGS: Lower chest: The visualized lung bases are clear. No intra-abdominal free air. Small free fluid in the pelvis. Hepatobiliary: No focal liver abnormality is seen. No gallstones, gallbladder wall thickening, or biliary dilatation. Pancreas: Unremarkable. No pancreatic ductal dilatation or surrounding inflammatory changes. Spleen: There is a 17 mm focus of coarse calcification in the spleen, likely sequela of prior insult. Adrenals/Urinary Tract: The adrenal glands unremarkable. There is no hydronephrosis on either side. There is symmetric enhancement and excretion of contrast by both kidneys. The visualized ureters and urinary bladder appear unremarkable. Stomach/Bowel: There is postsurgical changes of partial sigmoid resection with anastomotic suture. There is dilatation of a loop of small bowel in the lower abdomen. There is moderate engorgement of the associated mesentery. There is a transition in the lower abdomen (49/4 and coronal 38/7) likely secondary to adhesion. A second transition is noted inferiorly (coronal 52/7 and axial 55/4). Findings findings concerning for a closed loop obstruction. Clinical correlation and surgical consult is advised. No perforation or evidence of pneumatosis at this time. The appendix  is normal. Vascular/Lymphatic: Mild aortoiliac atherosclerotic disease. The IVC is unremarkable. No portal venous gas. There is no adenopathy. Reproductive: Hysterectomy. No adnexal masses. Other: None Musculoskeletal: Osteopenia. No acute osseous pathology. IMPRESSION: Small-bowel obstruction in the lower abdomen with findings concerning for a closed loop obstruction. Clinical correlation and surgical consult is advised. No perforation or evidence of pneumatosis at this time. Electronically Signed   By: Elgie Collard M.D.   On: 03/18/2021 20:26   DG Chest Portable 1 View  Result Date: 03/18/2021 CLINICAL DATA:  Nasogastric tube placement. EXAM: PORTABLE CHEST 1 VIEW COMPARISON:  January 29 2020 and same day CT abdomen pelvis. FINDINGS: Nasogastric tube coursing below the diaphragm with side port overlying a gaseously distended stomach and tip obscured by collimation. The heart size and mediastinal contours are within normal limits. Both lungs are clear. The visualized skeletal structures are unremarkable. IMPRESSION: 1. No active cardiopulmonary disease. 2. Nasogastric tube coursing below the diaphragm with side port overlying a gaseously distended stomach and tip obscured by collimation. Electronically Signed   By: Maudry Mayhew MD   On: 03/18/2021 22:26   DG Abd Portable 1V-Small Bowel Obstruction Protocol-initial, 8 hr delay  Result Date: 03/19/2021 CLINICAL DATA:  8 hour delay for evaluation of small-bowel obstruction. EXAM: PORTABLE ABDOMEN - 1 VIEW COMPARISON:  03/18/2021. FINDINGS: NG tube noted with tip over the stomach. Oral contrast noted within nondilated small bowel loops and throughout the colon. No bowel distention. No free air. Contrast again noted the bladder. IMPRESSION: NG tube noted with tip over stomach. Oral contrast noted within nondilated loops of small bowel and throughout the colon. No bowel distention. Electronically Signed   By: Maisie Fus  Register   On: 03/19/2021 06:36   DG Abd  Portable 1V-Small Bowel Protocol-Position Verification  Result Date: 03/18/2021 CLINICAL DATA:  NG tube placement EXAM:  PORTABLE ABDOMEN - 1 VIEW COMPARISON:  CT 03/18/2021 FINDINGS: An enteric tube is been placed with tip in the left upper quadrant consistent with location in the body of the stomach. Residual contrast material in the bladder. Calcification in the right mid abdomen has been present since previous CT of 02/26/2015, possibly dystrophic. Visualized bones and soft tissue contours appear intact. Gas-filled colon. No small bowel or colonic distention is identified. IMPRESSION: Enteric tube tip is in the left upper quadrant consistent with location in the body of the stomach. Electronically Signed   By: Burman Nieves M.D.   On: 03/18/2021 22:32    Procedures None   Hospital Course:  Patient is a 60 year old female who presented to [facility] with abdominal pain.  Workup showed PSBO.  Patient was admitted and underwent small bowel obstruction protocol. Patient had contrast in her colon on 8 hr delay abdominal film, NGT was removed and patient started on CLD.  Diet was advanced as tolerated.  On 03/20/21, the patient was voiding well, tolerating diet, ambulating well, pain well controlled, vital signs stable, and felt stable for discharge home.  Patient also noted to have some hypokalemia and discharge on PO supplementation and instructed to follow up with primary care.     Allergies as of 03/20/2021   No Known Allergies     Medication List    TAKE these medications   acetaminophen 500 MG tablet Commonly known as: TYLENOL Take 1,000 mg by mouth every 6 (six) hours as needed for headache or moderate pain.   Potassium Chloride ER 20 MEQ Tbcr Take 20 mEq by mouth in the morning and at bedtime for 5 days. What changed: when to take this         Follow-up Information    Fayette Pho, MD. Call.   Specialty: Family Medicine Why: Call and follow up regarding acute on chronic low  potassium level  Contact information: [address] [place] [address] [phone]               Signed: Juliet Rude , [facility] Surgery 03/20/2021, 12:53 PM Please see Amion for pager number during day hours 7:00am-4:30pm

## 2021-03-20 NOTE — Discharge Instructions (Signed)
Bowel Obstruction A bowel obstruction means that something is blocking the small or large bowel. The bowel is also called the intestine. It is the long tube that connects the stomach to the opening of the butt (anus). When something blocks the bowel, food and fluids cannot pass through like normal. This condition needs to be treated. Treatment depends on the cause of the problem and how bad the problem is. What are the causes? Common causes of this condition include:  Scar tissue (adhesions) from past surgery or from high-energy X-rays (radiation).  Recent surgery in the belly. This affects how food moves in the bowel.  Some diseases, such as: ? Irritation of the lining of the digestive tract (Crohn's disease). ? Irritation of small pouches in the bowel (diverticulitis).  Growths or tumors.  A bulging organ (hernia).  Twisting of the bowel (volvulus).  A foreign body.  Slipping of a part of the bowel into another part (intussusception).   What are the signs or symptoms? Symptoms of this condition include:  Pain in the belly.  Feeling sick to your stomach (nauseous).  Throwing up (vomiting).  Bloating in the belly.  Being unable to pass gas.  Trouble pooping (constipation).  Watery poop (diarrhea).  A lot of belching. How is this diagnosed? This condition may be diagnosed based on:  A physical exam.  Medical history.  Imaging tests, such as X-ray or CT scan.  Blood tests.  Urine tests. How is this treated? Treatment for this condition may include:  Fluids and pain medicines that are given through an IV tube. Your doctor may tell you not to eat or drink if you feel sick to your stomach and are throwing up.  Eating a clear liquid diet for a few days.  Putting a small tube (nasogastric tube) into the stomach. This will help with pain, discomfort, and nausea by removing blocked air and fluids from the stomach.  Surgery. This may be needed if other treatments do  not work. Follow these instructions at home: Medicines  Take over-the-counter and prescription medicines only as told by your doctor.  If you were prescribed an antibiotic medicine, take it as told by your doctor. Do not stop taking the antibiotic even if you start to feel better. General instructions  Follow your diet as told by your doctor. You may need to: ? Only drink clear liquids until you start to get better. ? Avoid solid foods.  Return to your normal activities as told by your doctor. Ask your doctor what activities are safe for you.  Do not sit for a long time without moving. Get up to take short walks every 1-2 hours. This is important. Ask for help if you feel weak or unsteady.  Keep all follow-up visits as told by your doctor. This is important. How is this prevented? After having a bowel obstruction, you may be more likely to have another. You can do some things to stop it from happening again.  If you have a long-term (chronic) disease, contact your doctor if you see changes or problems.  Take steps to prevent or treat trouble pooping. Your doctor may ask that you: ? Drink enough fluid to keep your pee (urine) pale yellow. ? Take over-the-counter or prescription medicines. ? Eat foods that are high in fiber. These include beans, whole grains, and fresh fruits and vegetables. ? Limit foods that are high in fat and sugar. These include fried or sweet foods.  Stay active. Ask your doctor which   exercises are safe for you.  Avoid stress.  Eat three small meals and three small snacks each day.  Work with a food expert (dietitian) to make a meal plan that works for you.  Do not use any products that contain nicotine or tobacco, such as cigarettes and e-cigarettes. If you need help quitting, ask your doctor.   Contact a doctor if:  You have a fever.  You have chills. Get help right away if:  You have pain or cramps that get worse.  You throw up blood.  You are  sick to your stomach.  You cannot stop throwing up.  You cannot drink fluids.  You feel mixed up (confused).  You feel very thirsty (dehydrated).  Your belly gets more bloated.  You feel weak or you pass out (faint). Summary  A bowel obstruction means that something is blocking the small or large bowel.  Treatment may include IV fluids and pain medicine. You may also have a clear liquid diet, a small tube in your stomach, or surgery.  Drink clear liquids and avoid solid foods until you get better. This information is not intended to replace advice given to you by your health care provider. Make sure you discuss any questions you have with your health care provider. Document Revised: 03/22/2018 Document Reviewed: 03/22/2018 Elsevier Patient Education  2021 [employer]  

## 2021-03-20 NOTE — Progress Notes (Signed)
Progress Note     Subjective: Patient reports some mild abdominal soreness but improved from initial presentation. She denies n/v. Passing flatus. She wants to go outside and smoke.   Objective: Vital signs in last 24 hours: Temp:  [98.2 F (36.8 C)-98.9 F (37.2 C)] 98.4 F (36.9 C) (04/29 0441) Pulse Rate:  [62-78] 62 (04/29 0441) Resp:  [17-20] 20 (04/29 0441) BP: (133-152)/(66-82) 141/72 (04/29 0441) SpO2:  [97 %-100 %] 100 % (04/29 0441) Last BM Date: 03/19/21  Intake/Output from previous day: 04/28 0701 - 04/29 0700 In: 903.2 [P.O.:240; I.V.:663.2] Out: -  Intake/Output this shift: No intake/output data recorded.  PE: General: pleasant, WD, thin female who is laying in bed in NAD HEENT: head is normocephalic, atraumatic.  Sclera are noninjected. Ears and nose without any masses or lesions.  Mouth is pink and dry Heart: regular, rate, and rhythm.  Normal s1,s2. No obvious murmurs, gallops, or rubs noted.   Lungs: CTAB, no wheezes, rhonchi, or rales noted.  Respiratory effort nonlabored Abd: soft, NT, ND, +BS, surgical scars well healed  MS: all 4 extremities are symmetrical with no cyanosis, clubbing, or edema. Skin: warm and dry with no masses, lesions, or rashes Neuro: Cranial nerves 2-12 grossly intact, sensation is normal throughout Psych: A&Ox3 with an appropriate affect.    Lab Results:  Recent Labs    03/19/21 0051 03/20/21 0217  WBC 9.0 5.7  HGB 10.4* 8.6*  HCT 33.8* 27.5*  PLT 181 181   BMET Recent Labs    03/19/21 1223 03/20/21 0217  NA 140 140  K 3.1* 2.8*  CL 105 105  CO2 32 29  GLUCOSE 120* 91  BUN 5* 5*  CREATININE 0.51 0.47  CALCIUM 8.3* 8.4*   PT/INR No results for input(s): LABPROT, INR in the last 72 hours. CMP     Component Value Date/Time   NA 140 03/20/2021 0217   NA 139 11/13/2020 1656   K 2.8 (L) 03/20/2021 0217   CL 105 03/20/2021 0217   CO2 29 03/20/2021 0217   GLUCOSE 91 03/20/2021 0217   BUN 5 (L) 03/20/2021  0217   BUN 8 11/13/2020 1656   CREATININE 0.47 03/20/2021 0217   CREATININE 0.57 12/10/2013 1103   CALCIUM 8.4 (L) 03/20/2021 0217   PROT 7.1 03/18/2021 1535   PROT 6.8 11/13/2020 1656   ALBUMIN 4.0 03/18/2021 1535   ALBUMIN 4.4 11/13/2020 1656   AST 17 03/18/2021 1535   ALT 10 03/18/2021 1535   ALKPHOS 92 03/18/2021 1535   BILITOT 0.7 03/18/2021 1535   BILITOT 0.3 11/13/2020 1656   GFRNONAA >60 03/20/2021 0217   GFRAA 114 11/13/2020 1656   Lipase     Component Value Date/Time   LIPASE 28 03/18/2021 1535       Studies/Results: CT Abdomen Pelvis W Contrast  Result Date: 03/18/2021 CLINICAL DATA:  60 year old female with abdominal pain. EXAM: CT ABDOMEN AND PELVIS WITH CONTRAST TECHNIQUE: Multidetector CT imaging of the abdomen and pelvis was performed using the standard protocol following bolus administration of intravenous contrast. CONTRAST:  3mL OMNIPAQUE IOHEXOL 300 MG/ML  SOLN COMPARISON:  CT abdomen pelvis dated 11/08/2020. FINDINGS: Lower chest: The visualized lung bases are clear. No intra-abdominal free air. Small free fluid in the pelvis. Hepatobiliary: No focal liver abnormality is seen. No gallstones, gallbladder wall thickening, or biliary dilatation. Pancreas: Unremarkable. No pancreatic ductal dilatation or surrounding inflammatory changes. Spleen: There is a 17 mm focus of coarse calcification in the spleen, likely sequela  of prior insult. Adrenals/Urinary Tract: The adrenal glands unremarkable. There is no hydronephrosis on either side. There is symmetric enhancement and excretion of contrast by both kidneys. The visualized ureters and urinary bladder appear unremarkable. Stomach/Bowel: There is postsurgical changes of partial sigmoid resection with anastomotic suture. There is dilatation of a loop of small bowel in the lower abdomen. There is moderate engorgement of the associated mesentery. There is a transition in the lower abdomen (49/4 and coronal 38/7) likely  secondary to adhesion. A second transition is noted inferiorly (coronal 52/7 and axial 55/4). Findings findings concerning for a closed loop obstruction. Clinical correlation and surgical consult is advised. No perforation or evidence of pneumatosis at this time. The appendix is normal. Vascular/Lymphatic: Mild aortoiliac atherosclerotic disease. The IVC is unremarkable. No portal venous gas. There is no adenopathy. Reproductive: Hysterectomy. No adnexal masses. Other: None Musculoskeletal: Osteopenia. No acute osseous pathology. IMPRESSION: Small-bowel obstruction in the lower abdomen with findings concerning for a closed loop obstruction. Clinical correlation and surgical consult is advised. No perforation or evidence of pneumatosis at this time. Electronically Signed   By: Elgie Collard M.D.   On: 03/18/2021 20:26   DG Chest Portable 1 View  Result Date: 03/18/2021 CLINICAL DATA:  Nasogastric tube placement. EXAM: PORTABLE CHEST 1 VIEW COMPARISON:  January 29 2020 and same day CT abdomen pelvis. FINDINGS: Nasogastric tube coursing below the diaphragm with side port overlying a gaseously distended stomach and tip obscured by collimation. The heart size and mediastinal contours are within normal limits. Both lungs are clear. The visualized skeletal structures are unremarkable. IMPRESSION: 1. No active cardiopulmonary disease. 2. Nasogastric tube coursing below the diaphragm with side port overlying a gaseously distended stomach and tip obscured by collimation. Electronically Signed   By: Maudry Mayhew MD   On: 03/18/2021 22:26   DG Abd Portable 1V-Small Bowel Obstruction Protocol-initial, 8 hr delay  Result Date: 03/19/2021 CLINICAL DATA:  8 hour delay for evaluation of small-bowel obstruction. EXAM: PORTABLE ABDOMEN - 1 VIEW COMPARISON:  03/18/2021. FINDINGS: NG tube noted with tip over the stomach. Oral contrast noted within nondilated small bowel loops and throughout the colon. No bowel distention. No  free air. Contrast again noted the bladder. IMPRESSION: NG tube noted with tip over stomach. Oral contrast noted within nondilated loops of small bowel and throughout the colon. No bowel distention. Electronically Signed   By: Maisie Fus  Register   On: 03/19/2021 06:36   DG Abd Portable 1V-Small Bowel Protocol-Position Verification  Result Date: 03/18/2021 CLINICAL DATA:  NG tube placement EXAM: PORTABLE ABDOMEN - 1 VIEW COMPARISON:  CT 03/18/2021 FINDINGS: An enteric tube is been placed with tip in the left upper quadrant consistent with location in the body of the stomach. Residual contrast material in the bladder. Calcification in the right mid abdomen has been present since previous CT of 02/26/2015, possibly dystrophic. Visualized bones and soft tissue contours appear intact. Gas-filled colon. No small bowel or colonic distention is identified. IMPRESSION: Enteric tube tip is in the left upper quadrant consistent with location in the body of the stomach. Electronically Signed   By: Burman Nieves M.D.   On: 03/18/2021 22:32    Anti-infectives: Anti-infectives (From admission, onward)   None       Assessment/Plan Hx of colostomy s/p takedown  Constipation  Hypokalemia - K 2.8 this AM, getting IV and PO replacement, recheck BMET 1400  SBO - contrast in colon on 8h delay film and patient had a BM - advanced  to soft diet this AM - replace K - home this afternoon if tol diet and K better   FEN: soft diet  VTE: SCDs, lovenox ID: no current abx  LOS: 2 days    Juliet Rude, [facility] 03/20/2021, 9:12 AM Please see Amion for pager number during day hours 7:00am-4:30pm

## 2021-03-20 NOTE — Progress Notes (Addendum)
The patient was discharged from 5W09. IV d/c'd, skin intact. VS stable. Patient has no complaints or questions at this time. AVS reviewed with patient and her daughter, who will be driving her home. All belongings were taken home with patient.

## 2021-03-23 ENCOUNTER — Other Ambulatory Visit: Payer: Self-pay | Admitting: *Deleted

## 2021-03-23 ENCOUNTER — Encounter: Payer: Self-pay | Admitting: *Deleted

## 2021-03-23 NOTE — Patient Outreach (Signed)
[facility]) Care Management  03/23/2021  [name] [name] 16-Jul-1961 [id number]   Transition of care call/case closure   Referral received:03/19/21 Initial outreach:03/23/21 Insurance: [place] UMR    Subjective: Initial outreach call to patient contact number with [employer] interpreter, [place], # JRKB, provided contact information and message to provide if no answer. Unsuccessful call pacific interpreter  unable to leave a message.  Placed call to patient  Daughter, [name] [name], [name] [name] as she goes by), [employer] release that speaks english , 2 HIPAA identifiers verified. Explained purpose of call and completed transition of care assessment.  She states patient is doing okay, spoke  her on yesterday and plans to visit her again on tomorrow. Patient lives at home with another daughter .She denies patient complaint of abdominal pain, nausea or vomiting. She reports patient is soft diet, no heavy foods She discussed understanding of  patient with low potassium level at discharge and making sure she eats foods with potassium including a banana. [name] [name] states that patient has prescription for potassium filled and understands how to take. .Patient reports patient having usual  bowel movements as she understands denies worsening symptoms of increase diarrhea.She reports patient tolerating mobility at home as her  daughter assist her with taking walks. Patient children are assisting with her  recovery.   Reviewed accessing the following [place] Benefits : She does not use a [facility] outpatient pharmacy.   .    Objective:  [name] [name]  was hospitalized at [facility] 4/27-4/29 for Partial small bowel obstruction . Past medical history includes : Colostomy and s/p colostomy takedown. She  was discharged to home on 03/20/21 without the need for home health services or DME.   Assessment:  Patient voices good understanding of all discharge instructions.  See transition of care  flowsheet for assessment details. Patient daughter plans to call  schedule post discharge visit with PCP on her day off on tomorrow, verified having MD office contact number.     Plan:  Reviewed hospital discharge diagnosis of  Partial small bowel obstruction    and discharge treatment plan using hospital discharge instructions, assessing medication adherence, reviewing problems requiring provider notification, and discussing the importance of follow up with surgeon, primary care provider and/or specialists as directed.  Reviewed [place] healthy lifestyle program information to receive discounted premium for  2023   Step 1: Get  your annual physical  Step 2: Complete your health assessment  Step 3:Identify your current health status and complete the corresponding action step between November 22, 2020 and July 23, 2021.    Daughter agreeable to follow up call in the next 3 business days regarding being able to schedule  patient follow up with PCP, and discuss accessing [place] benefits.  Will route successful outreach letter with [facility] Care Management pamphlet and 24 Hour Nurse Line Magnet to [employer] Care Management clinical pool to be mailed to patient's home address.    Egbert Garibaldi, RN, BSN  [facility] Care Management,Care Management Coordinator  [phone]- Mobile [phone]- Toll Free Main Office

## 2021-03-24 ENCOUNTER — Ambulatory Visit: Payer: Self-pay | Admitting: *Deleted

## 2021-03-25 ENCOUNTER — Other Ambulatory Visit: Payer: Self-pay | Admitting: *Deleted

## 2021-03-25 NOTE — Patient Outreach (Signed)
[facility]) Care Management  03/25/2021  [name] [name] 1961/10/23 [id number]   Transition of care /follow up call   Referral received:03/19/21 Initial outreach:03/23/21 Insurance: [place] UMR    Subjective  Outreach call to patient using [employer] interpreter , Risuin #JRKB, no answer  able to leave a HIPAA compliant voicemail message.   Placed call to patient , Daughter [name] [name])  , Designated party release. She reports [name] [name] is eating a little better, and feeling some better, states that she still  complains of some stomach discomfort. She reports patient has been able to return to work. Patient daughter plans to call and  schedule patient post discharge visit with provider on today if her day off.    Objective: [name] [name] was hospitalized [facility] 4/27-4/29 for Partial small bowel obstruction . Past medical history includes : Colostomy and s/p colostomy takedown. She  was discharged to home on 03/20/21 without the need for home health servicesor DME.  Plan  Patient daughter agreeable to follow call in the next week, assess for ongoing care needs prior to case closure.   Egbert Garibaldi, RN, BSN  [facility] Care Management,Care Management Coordinator  [phone]- Mobile [phone]- Toll Free Main Office

## 2021-04-01 ENCOUNTER — Other Ambulatory Visit: Payer: Self-pay | Admitting: *Deleted

## 2021-04-01 NOTE — Patient Outreach (Signed)
[facility]) Care Management  04/01/2021  [name] [name] 1960/12/07 [id number]   Transition of care /follow up call   Referral received:03/19/21 Initial outreach:03/23/21 Insurance: [facility]  Outreach #3 Subjective  Placed call to patient , Daughter [name]  , Designated party release, no answer received message voicemail not set up.  1500  Returned call to [place] , she reports that she just received her phone back and plans to make contact with PCP office to schedule patient post discharge visit, verified she has contact number. She reports that patient has returned to work but still has complaints with stomach discomfort reinforced importance of follow up with PCP she agrees. She reports she plans to schedule appointment so that she can attend with her a usual. Offered to transfer call to assist with arranging appointment at this time , she declined.     Plan Daughter request call in the next week to make sure she has been able to make appointment .     Egbert Garibaldi, RN, BSN  [facility] Care Management,Care Management Coordinator  [phone]- Mobile [phone]- Toll Free Main Office

## 2021-04-08 ENCOUNTER — Other Ambulatory Visit: Payer: Self-pay | Admitting: *Deleted

## 2021-04-08 NOTE — Patient Outreach (Signed)
[facility]) Care Management  04/08/2021  [name] [name] Nov 19, 1961 [id number]   Transition of care call/follow up call   Referral received: 03/19/21 Initial outreach attempt:03/23/21 Insurance: [place] UMR   Third outreach  call to patient's preferred  contact number to patient daughter [name] [name] designated party release for follow up on discharge transition of care assessment; no answer received message voicemail not set up.  With [facility] interpreter Koleen Nimrod # JRKB unsuccessful outreach at this time.    [name] .Past medical history includes : Colostomy and s/p colostomy takedown. Shewas discharged to home on 4/29/22without the need for home health servicesor DME. Plan: If no return call from patient, will plan return call in the next 2 weeks  Egbert Garibaldi, RN, BSN  [facility] Care Management,Care Management Coordinator  [phone]- Mobile [phone]- Toll Free Main Office

## 2021-04-22 ENCOUNTER — Encounter: Payer: Self-pay | Admitting: Family Medicine

## 2021-04-22 ENCOUNTER — Other Ambulatory Visit: Payer: Self-pay | Admitting: *Deleted

## 2021-04-22 ENCOUNTER — Ambulatory Visit (INDEPENDENT_AMBULATORY_CARE_PROVIDER_SITE_OTHER): Payer: 59 | Admitting: Family Medicine

## 2021-04-22 ENCOUNTER — Other Ambulatory Visit: Payer: Self-pay

## 2021-04-22 ENCOUNTER — Other Ambulatory Visit: Payer: Self-pay | Admitting: Family Medicine

## 2021-04-22 VITALS — BP 128/77 | HR 80 | Ht 59.0 in | Wt 77.2 lb

## 2021-04-22 DIAGNOSIS — E876 Hypokalemia: Secondary | ICD-10-CM

## 2021-04-22 DIAGNOSIS — R109 Unspecified abdominal pain: Secondary | ICD-10-CM | POA: Diagnosis not present

## 2021-04-22 DIAGNOSIS — K56609 Unspecified intestinal obstruction, unspecified as to partial versus complete obstruction: Secondary | ICD-10-CM | POA: Diagnosis not present

## 2021-04-22 DIAGNOSIS — R198 Other specified symptoms and signs involving the digestive system and abdomen: Secondary | ICD-10-CM | POA: Diagnosis not present

## 2021-04-22 NOTE — Patient Outreach (Signed)
[facility]) Care Management  04/22/2021  [name] [name] [birth year] [id number]   Transition of care call/follow up call/case closure   Referral received: 03/19/21 Initial outreach attempt:03/23/21 Insurance: [place] UMR   Follow up call to patient's preferred contactnumber to patient using pacific interpreter, per representative Estanislado Spire , interpreter available at this time .   Placed call to patient daughter [name] [name], listed as designated party release.  She reports that patient continues to work, she has complaints with stomach discomfort at times after eating. She discussed plans to accompany her mother to MD appointment on today and notified office when making appointment that she needed an interpreter.  Victorino Dike states that she is assist patient with her scheduling  Appointments, managing personal bills.She discussed concern regarding hospital bill, provided contact number to billing office [phone] as well as [employer] indemnity plan to file a claim if patient has the benefit, provided contact number [phone]. Also encouraged contacting insurance company - number on back of patient insurance card.   [facility]ive: [facility] .Past medical history includes : Colostomy and s/p colostomy takedown. Shewas discharged to home on 4/29/22without the need for home health servicesor DM  Plan No ongoing care management needs identified at this time , will close to [facility] care management. Encouraged patient daughter to contact care management if new concerns arise.    Egbert Garibaldi, RN, BSN  [facility] Care Management,Care Management Coordinator  [phone]- Mobile [phone]- Toll Free Main Office

## 2021-04-22 NOTE — Patient Instructions (Signed)
It was wonderful to see you today. Thank you for allowing me to be a part of your care. Below is a short summary of what we discussed at your visit today:  Abdominal Pain - Today we tested you for H. Pylori, a bacteria that can sometimes cause stomach pain - I will call you with the results - Keep taking miralax as needed to help soften your stool - I have referred you to [facility] for evaluation. If they don't call you within a week or so, please call them using the information below.     Please bring all of your medications to every appointment!  If you have any questions or concerns, please do not hesitate to contact us via phone or MyChart message.   Fayette Pho, MD

## 2021-04-23 ENCOUNTER — Encounter: Payer: Self-pay | Admitting: Family Medicine

## 2021-04-23 LAB — BASIC METABOLIC PANEL
BUN/Creatinine Ratio: 8 — ABNORMAL LOW (ref 9–23)
BUN: 6 mg/dL (ref 6–24)
CO2: 28 mmol/L (ref 20–29)
Calcium: 9 mg/dL (ref 8.7–10.2)
Chloride: 96 mmol/L (ref 96–106)
Creatinine, Ser: 0.72 mg/dL (ref 0.57–1.00)
Glucose: 90 mg/dL (ref 65–99)
Potassium: 3.2 mmol/L — ABNORMAL LOW (ref 3.5–5.2)
Sodium: 138 mmol/L (ref 134–144)
eGFR: 96 mL/min/{1.73_m2} (ref >59)

## 2021-04-24 LAB — H. PYLORI BREATH TEST: H pylori Breath Test: NEGATIVE

## 2021-04-26 ENCOUNTER — Encounter: Payer: Self-pay | Admitting: Family Medicine

## 2021-04-26 NOTE — Assessment & Plan Note (Addendum)
Likely related to chronic constipation along with adhesions and scar tissue from colostomy s/p reversal. Possible H. Pylori, given burning sensation and no prior test. No blood in stool, weight loss, or night sweats. Had colonoscopy in December 2021.  - continue miralax daily - H. Pylori breath testing today - refer back to [facility], who treated her when she had colostomy

## 2021-04-26 NOTE — Progress Notes (Signed)
    SUBJECTIVE:   CHIEF COMPLAINT / HPI:   Abdominal pain - intermittent since fall 2021 - only hurts with eating, not with drinking - lower quadrants mainly - feels like a burning sensation all the time but worsened with food - relieving factors: colonoscopy in Dec, miralax - hx constipation: takes miralax daily, helps with BM - usually has one BM per day - without miralax, "very hard" stool - has needed to manually disimpact herself before when not taking miralax  PERTINENT  PMH / PSH: partial SBO (April 2022), colostomy after bowel perforation (2016) s/p reversal, anemia, reduced visual acuity, diarrhea  OBJECTIVE:   BP 128/77   Pulse 80   Ht 4\' 11"  (1.499 m)   Wt 77 lb 3.2 oz (35 kg)   SpO2 98%   BMI 15.59 kg/m    PHQ-9:  Depression screen [facility] 2/9 08/07/2020 06/27/2020 11/07/2019  Decreased Interest 0 0 0  Down, Depressed, Hopeless 0 0 0  PHQ - 2 Score 0 0 0  Altered sleeping 0 - -  Tired, decreased energy 0 - -  Change in appetite 0 - -  Feeling bad or failure about yourself  0 - -  Trouble concentrating 1 - -  Moving slowly or fidgety/restless 1 - -  Suicidal thoughts 0 - -  PHQ-9 Score 2 - -  Difficult doing work/chores Somewhat difficult - -     GAD-7: No flowsheet data found.  Physical Exam General: Awake, alert, oriented Cardiovascular: Regular rate and rhythm, S1 and S2 present, no murmurs auscultated Respiratory: Lung fields clear to auscultation bilaterally Abdomen: Soft, nondistended, mild TTP in lower quadrants and umbilical area, no rebound tenderness or guarding  ASSESSMENT/PLAN:   Abdominal pain Likely related to chronic constipation along with adhesions and scar tissue from colostomy s/p reversal. Possible H. Pylori, given burning sensation and no prior test. No blood in stool, weight loss, or night sweats. Had colonoscopy in December 2021.  - continue miralax daily - H. Pylori breath testing today - refer back to [facility], who treated her  when she had colostomy     January 2022, MD [facility]

## 2021-04-28 ENCOUNTER — Telehealth: Payer: Self-pay | Admitting: Family Medicine

## 2021-04-28 DIAGNOSIS — K5909 Other constipation: Secondary | ICD-10-CM

## 2021-04-28 DIAGNOSIS — R1030 Lower abdominal pain, unspecified: Secondary | ICD-10-CM

## 2021-04-28 MED ORDER — POLYETHYLENE GLYCOL 3350 17 GM/SCOOP PO POWD: 17.0000 g | Freq: Two times a day (BID) | ORAL | 1 refills | Status: DC

## 2021-04-28 NOTE — Telephone Encounter (Signed)
Attempted to call daughter [name] [name] at [phone] with H. Pylori test results. Called daughter as she was at the last appointment and both she and the patent [name] [name] requested I call the daughter with any results.   No answer, phone rang busy and could not leave VM.   H. Pylori testing negative. Suspect abdominal pain is related to combination of chronic constipation and abdominal scarring from previous colectomy in 2016 (now s/p reversal). Recommend miralax twice daily for one week, followed by miralax daily. Sent in script.   Also recommend follow through with GI referral to [facility], who treated her in 2016.   Fayette Pho, MD

## 2021-06-07 ENCOUNTER — Encounter (HOSPITAL_COMMUNITY): Payer: Self-pay | Admitting: *Deleted

## 2021-06-07 ENCOUNTER — Ambulatory Visit (HOSPITAL_COMMUNITY)
Admission: EM | Admit: 2021-06-07 | Discharge: 2021-06-07 | Disposition: A | Discharge status: Home / Self Care | Payer: 59 | Attending: Physician Assistant | Admitting: Physician Assistant

## 2021-06-07 ENCOUNTER — Other Ambulatory Visit: Payer: Self-pay

## 2021-06-07 DIAGNOSIS — M25512 Pain in left shoulder: Secondary | ICD-10-CM | POA: Diagnosis not present

## 2021-06-07 DIAGNOSIS — M25511 Pain in right shoulder: Secondary | ICD-10-CM

## 2021-06-07 DIAGNOSIS — M791 Myalgia, unspecified site: Secondary | ICD-10-CM

## 2021-06-07 MED ORDER — TIZANIDINE HCL 2 MG PO CAPS: 2.0000 mg | ORAL_CAPSULE | Freq: Two times a day (BID) | ORAL | 0 refills | Status: DC | PRN
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
## 2021-06-07 NOTE — Discharge Instructions (Addendum)
Follow-up with primary care provider first thing next week as we discussed.  Use Zanaflex twice daily as needed for muscle pain.  This can make her sleepy so do not drive or drink alcohol while taking this medication.  You can use over-the-counter medications including Tylenol and ibuprofen for additional symptom/pain relief.  If you have any worsening symptoms including severe headache, nausea, vomiting, dizziness, confusion, vision changes, weakness, changes in speech you need to go directly to the emergency room as we discussed.
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
## 2021-06-12 ENCOUNTER — Ambulatory Visit: Payer: 59 | Admitting: Student
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
## 2021-06-17 ENCOUNTER — Ambulatory Visit: Payer: 59 | Admitting: Student

## 2021-06-17 ENCOUNTER — Encounter: Payer: Self-pay | Admitting: Student

## 2021-06-17 ENCOUNTER — Other Ambulatory Visit: Payer: Self-pay

## 2021-06-17 DIAGNOSIS — R109 Unspecified abdominal pain: Secondary | ICD-10-CM
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
## 2021-08-06 ENCOUNTER — Encounter: Payer: Self-pay | Admitting: Family Medicine

## 2021-08-06 ENCOUNTER — Ambulatory Visit (INDEPENDENT_AMBULATORY_CARE_PROVIDER_SITE_OTHER): Payer: 59 | Admitting: Family Medicine

## 2021-08-06 ENCOUNTER — Other Ambulatory Visit: Payer: Self-pay

## 2021-08-06 VITALS — BP 120/70 | HR 73 | Ht <= 58 in | Wt 73.8 lb

## 2021-08-06 DIAGNOSIS — Z1231 Encounter for screening mammogram for malignant neoplasm of breast: Secondary | ICD-10-CM | POA: Diagnosis not present

## 2021-08-06 DIAGNOSIS — R109 Unspecified abdominal pain: Secondary | ICD-10-CM | POA: Diagnosis not present

## 2021-08-06 DIAGNOSIS — Z23 Encounter for immunization: Secondary | ICD-10-CM | POA: Diagnosis not present

## 2021-08-06 HISTORY — DX: Encounter for immunization: Z23

## 2021-08-06 MED ORDER — DOCUSATE SODIUM 100 MG PO CAPS: 100.0000 mg | ORAL_CAPSULE | Freq: Two times a day (BID) | ORAL | 0 refills | Status: DC

## 2021-08-06 NOTE — Progress Notes (Signed)
    SUBJECTIVE:   CHIEF COMPLAINT / HPI:   [name] [name] presents today for chronic left lower quadrant abdominal discomfort that has been more frequent. [place] interpreter utilized during today's visit.  Record review shows patient had perforated sigmoid colon at time of colonoscopy in 2016. Was taken emergently to surgery and had resection of sigmoid colon and creation of ostomy. Ostomy subsequently reversed later that year.   This pain has been ongoing since her surgery in 2016 and typically intermittent. It has recently become daily. Describes sensation as more burning and less painful. It worsens after eating immediately and the more she eats, the more it moves from isolated lower left quadrant pain to include left upper quadrant pain. It is the same spot every time and is the same kind of discomfort despite trying different kinds of food. She experiences instant relief with bowel movements which tend to be hard and ball-like. Endorses constipation unless she uses Miralax which she does every 2-3 days. She does not use daily because it makes her relieve herself immediately which is difficult when she works.  She takes 2 tablets of tylenol every day for a headache. She does not take anything specifically for her abdominal discomfort.   OBJECTIVE:   BP 120/70   Pulse 73   Ht 4\' 10"  (1.473 m)   Wt 73 lb 12.8 oz (33.5 kg)   SpO2 99%   BMI 15.42 kg/m   GEN: Well appearing, in no acute distress GI: Soft, mildly tender to palpation along left side of abdomen. Pain reproduced when pressing on epigastric area as well. No rebound tenderness. No peritoneal signs. Normoactive bowel sounds.  NEUR: Grossly normal   ASSESSMENT/PLAN:   Abdominal pain Suspect pain related to adhesions and constipation. Recommend increase in miralax, and addition of colace.  Recommend increasing frequency in Miralax to every other day to clear out system and scaling back as needed. Resending GI referral to evaluate  source of discomfort.    Health Maintenance Sent referral for mammogram. Received flu vaccine today.   , Medical Student  [facility]    Patient seen along with MS3 student Alroy Bailiff. I personally evaluated this patient along with the student, and verified all aspects of the history, physical exam, and medical decision making as documented by the student. I agree with the student's documentation and have made all necessary edits.  Alease Medina, MD  [facility]

## 2021-08-06 NOTE — Assessment & Plan Note (Addendum)
Suspect pain related to adhesions and constipation. Recommend increase in miralax, and addition of colace.  Recommend increasing frequency in Miralax to every other day to clear out system and scaling back as needed. Resending GI referral to evaluate source of discomfort.

## 2021-08-06 NOTE — Patient Instructions (Addendum)
It was nice to meet you today!  Increase miralax to every day or every other day.  Take colace twice daily for stool softener.  Referring back to stomach doctor  To schedule your mammogram, call the [facility] at [phone]. They are located at [address].    Be well, Dr. Pollie Meyer

## 2021-08-13 ENCOUNTER — Encounter (HOSPITAL_COMMUNITY): Payer: Self-pay | Admitting: Emergency Medicine

## 2021-08-13 ENCOUNTER — Other Ambulatory Visit: Payer: Self-pay

## 2021-08-13 ENCOUNTER — Emergency Department (HOSPITAL_COMMUNITY)
Admission: EM | Admit: 2021-08-13 | Discharge: 2021-08-14 | Disposition: A | Discharge status: Home / Self Care | Payer: 59 | Attending: Emergency Medicine | Admitting: Emergency Medicine

## 2021-08-13 DIAGNOSIS — E876 Hypokalemia: Secondary | ICD-10-CM | POA: Diagnosis not present

## 2021-08-13 DIAGNOSIS — R1031 Right lower quadrant pain: Secondary | ICD-10-CM | POA: Diagnosis not present

## 2021-08-13 DIAGNOSIS — R1032 Left lower quadrant pain: Secondary | ICD-10-CM | POA: Diagnosis not present

## 2021-08-13 DIAGNOSIS — F17210 Nicotine dependence, cigarettes, uncomplicated: Secondary | ICD-10-CM | POA: Insufficient documentation

## 2021-08-13 DIAGNOSIS — R110 Nausea: Secondary | ICD-10-CM | POA: Diagnosis not present

## 2021-08-13 DIAGNOSIS — R109 Unspecified abdominal pain: Secondary | ICD-10-CM

## 2021-08-13 DIAGNOSIS — R0789 Other chest pain: Secondary | ICD-10-CM | POA: Diagnosis not present

## 2021-08-13 LAB — CBC WITH DIFFERENTIAL/PLATELET
Abs Immature Granulocytes: 0 10*3/uL (ref 0.00–0.07)
Basophils Absolute: 0 10*3/uL (ref 0.0–0.1)
Basophils Relative: 0 %
Eosinophils Absolute: 0.1 10*3/uL (ref 0.0–0.5)
Eosinophils Relative: 2 %
HCT: 32.7 % — ABNORMAL LOW (ref 36.0–46.0)
Hemoglobin: 10.3 g/dL — ABNORMAL LOW (ref 12.0–15.0)
Immature Granulocytes: 0 %
Lymphocytes Relative: 44 %
Lymphs Abs: 2.4 10*3/uL (ref 0.7–4.0)
MCH: 23 pg — ABNORMAL LOW (ref 26.0–34.0)
MCHC: 31.5 g/dL (ref 30.0–36.0)
MCV: 73.2 fL — ABNORMAL LOW (ref 80.0–100.0)
Monocytes Absolute: 0.4 10*3/uL (ref 0.1–1.0)
Monocytes Relative: 7 %
Neutro Abs: 2.5 10*3/uL (ref 1.7–7.7)
Neutrophils Relative %: 47 %
Platelets: 227 10*3/uL (ref 150–400)
RBC: 4.47 MIL/uL (ref 3.87–5.11)
RDW: 13.5 % (ref 11.5–15.5)
WBC: 5.4 10*3/uL (ref 4.0–10.5)
nRBC: 0 % (ref 0.0–0.2)

## 2021-08-13 LAB — COMPREHENSIVE METABOLIC PANEL
ALT: 11 U/L (ref 0–44)
AST: 17 U/L (ref 15–41)
Albumin: 3.7 g/dL (ref 3.5–5.0)
Alkaline Phosphatase: 77 U/L (ref 38–126)
Anion gap: 7 (ref 5–15)
BUN: 7 mg/dL (ref 6–20)
CO2: 31 mmol/L (ref 22–32)
Calcium: 9.2 mg/dL (ref 8.9–10.3)
Chloride: 101 mmol/L (ref 98–111)
Creatinine, Ser: 0.73 mg/dL (ref 0.44–1.00)
GFR, Estimated: >60 mL/min (ref >60)
Glucose, Bld: 97 mg/dL (ref 70–99)
Potassium: 2.6 mmol/L — CL (ref 3.5–5.1)
Sodium: 139 mmol/L (ref 135–145)
Total Bilirubin: 0.7 mg/dL (ref 0.3–1.2)
Total Protein: 6.6 g/dL (ref 6.5–8.1)

## 2021-08-13 LAB — LIPASE, BLOOD: Lipase: 35 U/L (ref 11–51)

## 2021-08-13 LAB — URINALYSIS, ROUTINE W REFLEX MICROSCOPIC
Bacteria, UA: NONE SEEN
Bilirubin Urine: NEGATIVE
Glucose, UA: NEGATIVE mg/dL
Ketones, ur: NEGATIVE mg/dL
Nitrite: NEGATIVE
Protein, ur: NEGATIVE mg/dL
Specific Gravity, Urine: 1.003 — ABNORMAL LOW (ref 1.005–1.030)
pH: 8 (ref 5.0–8.0)

## 2021-08-13 NOTE — ED Triage Notes (Signed)
Patient here with complaint of abdominal pain that started  at 1100 yesterday. Patient complains of a burning sensation that seems to come on regardless of oral intake. Patient also complains of headache started yesterday, dizziness, and weakness that started yesterday. Patient alert, oriented, and ambulatory at this time.

## 2021-08-13 NOTE — ED Provider Notes (Signed)
Emergency Medicine Provider Triage Evaluation Note  [name] [name] , a 60 y.o. female  was evaluated in triage.  Pt complains of abdominal pain.  Patient states symptoms have been longstanding, however increased last night.  Has been accompanied with decreased oral intake, nausea and diarrhea.  Accompanied with burning sensation that is present regardless of oral intake.  Normal flatus.  States she has had the symptoms before and was worked up for same with unable to discern source of symptoms.  She has history of appendectomy and abdominal hysterectomy.  Review of Systems  Positive: Abdominal pain, diarrhea Negative:   Physical Exam  BP 137/72 (BP Location: Left Arm)   Pulse 64   Temp 98.9 F (37.2 C)   Resp 16   Ht 4\' 11"  (1.499 m)   Wt 40.8 kg   SpO2 100%   BMI 18.18 kg/m  Gen:   Awake, no distress   Resp:  Normal effort  MSK:   Moves extremities without difficulty  Other:  Diffuse generalized tenderness throughout abdomen  Medical Decision Making  Medically screening exam initiated at 5:31 PM.  Appropriate orders placed.  [name] [name] was informed that the remainder of the evaluation will be completed by another provider, this initial triage assessment does not replace that evaluation, and the importance of remaining in the ED until their evaluation is complete.     08/13/21 1734    08/15/21, DO 08/13/21 1758

## 2021-08-13 NOTE — ED Notes (Signed)
Dr. Lockie Mola ( EDP) notified on patient's hypokalemia , EKG ordered .

## 2021-08-14 ENCOUNTER — Other Ambulatory Visit: Payer: Self-pay | Admitting: Family Medicine

## 2021-08-14 ENCOUNTER — Emergency Department (HOSPITAL_COMMUNITY): Payer: 59

## 2021-08-14 ENCOUNTER — Encounter (HOSPITAL_COMMUNITY): Payer: Self-pay | Admitting: Emergency Medicine

## 2021-08-14 DIAGNOSIS — R109 Unspecified abdominal pain: Secondary | ICD-10-CM | POA: Diagnosis not present

## 2021-08-14 DIAGNOSIS — E876 Hypokalemia: Secondary | ICD-10-CM

## 2021-08-14 DIAGNOSIS — R1032 Left lower quadrant pain: Secondary | ICD-10-CM | POA: Diagnosis not present

## 2021-08-14 DIAGNOSIS — R110 Nausea: Secondary | ICD-10-CM | POA: Diagnosis not present

## 2021-08-14 DIAGNOSIS — F17210 Nicotine dependence, cigarettes, uncomplicated: Secondary | ICD-10-CM | POA: Diagnosis not present

## 2021-08-14 DIAGNOSIS — R1031 Right lower quadrant pain: Secondary | ICD-10-CM | POA: Diagnosis not present

## 2021-08-14 MED ORDER — ALUM & MAG HYDROXIDE-SIMETH 200-200-20 MG/5ML PO SUSP
30.0000 mL | Freq: Once | ORAL | Status: AC
  Administered 2021-08-14: 30 mL via ORAL
  Filled 2021-08-14: qty 30

## 2021-08-14 MED ORDER — SODIUM CHLORIDE 0.9 % IV BOLUS
500.0000 mL | Freq: Once | INTRAVENOUS | Status: AC
  Administered 2021-08-14: 500 mL via INTRAVENOUS

## 2021-08-14 MED ORDER — ONDANSETRON 8 MG PO TBDP: ORAL_TABLET | ORAL | 0 refills | Status: DC

## 2021-08-14 MED ORDER — POTASSIUM CHLORIDE CRYS ER 20 MEQ PO TBCR
80.0000 meq | EXTENDED_RELEASE_TABLET | Freq: Once | ORAL | Status: AC
  Administered 2021-08-14: 40 meq via ORAL
  Filled 2021-08-14: qty 4

## 2021-08-14 MED ORDER — ONDANSETRON HCL 4 MG/2ML IJ SOLN
4.0000 mg | Freq: Once | INTRAMUSCULAR | Status: AC
  Administered 2021-08-14: 4 mg via INTRAVENOUS
  Filled 2021-08-14: qty 2

## 2021-08-14 MED ORDER — POTASSIUM CHLORIDE CRYS ER 20 MEQ PO TBCR: 20.0000 meq | EXTENDED_RELEASE_TABLET | Freq: Two times a day (BID) | ORAL | 0 refills | Status: DC

## 2021-08-14 MED ORDER — MAGNESIUM SULFATE IN D5W 1-5 GM/100ML-% IV SOLN
1.0000 g | Freq: Once | INTRAVENOUS | Status: AC
  Administered 2021-08-14: 1 g via INTRAVENOUS
  Filled 2021-08-14: qty 100

## 2021-08-14 MED ORDER — OMEPRAZOLE 20 MG PO CPDR: 20.0000 mg | DELAYED_RELEASE_CAPSULE | Freq: Every day | ORAL | 0 refills | Status: DC

## 2021-08-14 MED ORDER — POTASSIUM & SODIUM PHOSPHATES 280-160-250 MG PO PACK
2.0000 | PACK | Freq: Three times a day (TID) | ORAL | Status: DC
  Administered 2021-08-14: 2 via ORAL
  Filled 2021-08-14 (×3): qty 2

## 2021-08-14 MED ORDER — IOHEXOL 350 MG/ML SOLN
80.0000 mL | Freq: Once | INTRAVENOUS | Status: AC | PRN
  Administered 2021-08-14: 80 mL via INTRAVENOUS

## 2021-08-14 MED ORDER — ACETAMINOPHEN 500 MG PO TABS
1000.0000 mg | ORAL_TABLET | Freq: Once | ORAL | Status: DC
  Filled 2021-08-14: qty 2

## 2021-08-14 NOTE — ED Notes (Signed)
Pt discharged and wheeled out of the ED in a wheel chair without difficulty. 

## 2021-08-14 NOTE — Progress Notes (Addendum)
FPTS Interim Progress Note  Received page from Dr. Nicanor [name], about patient in ED. Called ED provider and received signout on different patient, named [name] [name]. Patient is Family Medicine clinic patient, here with abdominal pain and concern for cancer. ED provider was not calling for admission, but was calling to close the care gap. Ed provider called family medicine service to see that we contact SW for patient in the am, and have them re-establish care/appointments for cancer screening, in outpatient setting. ED provider informed by upper level, Dr. Salvadore Dom, to call family medicine night service back if CT scan was concerning, but that this doesn't sound like an admission at this time. ED provider to call night team back, if admission is needed. Otherwise we will discuss with attending about close follow up.   ED provider called back to say CT abdomen non-concerning and patient doesn't need admission at this time. Patient received 80 meq oral K and was sent home from ED.   Night team called attending, Dr. Deirdre Priest, and made him aware of the situation, and need for an appointment. Night team attempted to schedule patient for an appointment, but was unable to get her in before her next appointment, already scheduled for 08/20/21. Night team made Dr. Nicanor [name] aware of this, and informed her that the clinic would try to schedule patient for earlier appointment.    [name] Kinds, MD 08/14/2021, 3:32 AM PGY-1, [facility] Family Medicine Service pager [phone]

## 2021-08-14 NOTE — ED Provider Notes (Signed)
[facility] EMERGENCY DEPARTMENT Provider Note   CSN: [id number] Arrival date & time: 08/13/21  1534     History Chief Complaint  Patient presents with  . Abdominal Pain    [name] [name] is a 60 y.o. female.  The history is provided by the patient and a relative.  Abdominal Pain Pain location:  RLQ and LLQ Pain quality: not aching   Pain radiates to:  Does not radiate Pain severity:  Moderate Onset quality:  Gradual Duration: years. Timing:  Constant Progression:  Worsening Chronicity:  Chronic Context: not diet changes, not eating and not laxative use   Relieved by:  Nothing Worsened by:  Nothing Ineffective treatments:  None tried Associated symptoms: nausea   Associated symptoms: no chest pain, no fever and no shortness of breath   Risk factors: no alcohol abuse   Family would like health maintenance exams while here.      Past Medical History:  Diagnosis Date  . Anemia 07/27/2016  . Atypical chest pain 08/07/2020  . 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 Constipation   . Cough 08/07/2020  . Diarrhea 11/13/2020   Acute watery diarrhea starting 12/14 associated with abdominal pain, headache, feeling "hot and cold".   . Difficulty sleeping   . Headache 07/18/2018  . Hypokalemia 06/27/2020  . Literacy level of illiterate 10/01/2016   Less than one year of formal education in [place].  Ms Linenberger is taking classes for reading and writing in [place].   . S/P colostomy [facility]) 03/02/2015   March 2016: colon perforation following screening colonoscopy   . S/P colostomy [facility]) 03/02/2015   March 2016: colon perforation following screening colonoscopy   . SBO (small bowel obstruction) (HCC) 03/18/2021    Patient Active Problem List   Diagnosis Date Noted  . Language barrier affecting health care 08/07/2020  . Annual physical exam 06/27/2020  . Literacy level of illiterate 10/01/2016  . Reduced visual acuity 09/30/2016  . Abdominal pain 01/15/2016    Past  Surgical History:  Procedure Laterality Date  . ABDOMINAL HYSTERECTOMY  1994  . APPENDECTOMY  1993  . CESAREAN SECTION  1992  . COLONOSCOPY N/A 05/30/2015   Procedure: COLONOSCOPY;  Surgeon: Romie Levee, MD;  Location: [facility];  Service: Endoscopy;  Laterality: N/A;  . COLONOSCOPY WITH PROPOFOL N/A 06/12/2015   Procedure: COLONOSCOPY WITH PROPOFOL;  Surgeon: Romie Levee, MD;  Location: [facility] ENDOS[facility];  Service: Endoscopy;  Laterality: N/A;  . COLOSTOMY  01/2015  . COLOSTOMY TAKEDOWN N/A 07/02/2015   Procedure: LAPAROSCOPIC COLOSTOMY REVERSAL SPLENIC FLEXURE MOBILIZATION AND PARTIAL COLECTOMY;  Surgeon: Romie Levee, MD;  Location: [facility];  Service: General;  Laterality: N/A;  . DILATION AND CURETTAGE OF UTERUS  1990   "miscarriage"  . LAPAROTOMY N/A 02/20/2015   Procedure: PARTIAL COLECTOMY END  ILEOSTOMY AND HARTMAN'S PROCEDURE;  Surgeon: Romie Levee, MD;  Location: [facility];  Service: General;  Laterality: N/A;     OB History   No obstetric history on file.     Family History  Problem Relation Age of Onset  . Colon cancer Neg Hx     Social History   Tobacco Use  . Smoking status: Every Day    Packs/day: 0.50    Years: 37.00    Pack years: 18.50    Types: Cigarettes  . Smokeless tobacco: Never  Substance Use Topics  . Alcohol use: No    Alcohol/week: 0.0 standard drinks  . Drug use: No    Home Medications Prior  to Admission medications   Medication Sig Start Date End Date Taking? Authorizing Provider  omeprazole (PRILOSEC) 20 MG capsule Take 1 capsule (20 mg total) by mouth daily. 08/14/21  Yes Nhi Butrum, MD  potassium chloride SA (KLOR-CON) 20 MEQ tablet Take 1 tablet (20 mEq total) by mouth 2 (two) times daily. 08/14/21  Yes Cass Edinger, MD  acetaminophen (TYLENOL) 500 MG tablet Take 1,000 mg by mouth every 6 (six) hours as needed for headache or moderate pain.    [provider]  docusate sodium (COLACE) 100 MG capsule Take 1 capsule (100 mg  total) by mouth 2 (two) times daily. 08/06/21   Latrelle Dodrill, MD  polyethylene glycol powder (GLYCOLAX/MIRALAX) 17 GM/SCOOP powder Take 17 g by mouth in the morning and at bedtime. For one week to relieve abdominal pain. Afterwards use once daily for maintenance of bowel movements. 04/28/21   Fayette Pho, MD    Allergies    Patient has no known allergies.  Review of Systems   Review of Systems  Constitutional:  Negative for fever.  HENT:  Negative for facial swelling.   Eyes:  Negative for redness.  Respiratory:  Negative for shortness of breath.   Cardiovascular:  Negative for chest pain.  Gastrointestinal:  Positive for abdominal pain and nausea.  Genitourinary:  Negative for difficulty urinating.  Musculoskeletal:  Negative for neck stiffness.  Skin:  Negative for pallor.  Neurological:  Negative for facial asymmetry.  Psychiatric/Behavioral:  Negative for agitation.   All other systems reviewed and are negative.  Physical Exam Updated Vital Signs BP (!) 136/102 (BP Location: Right Arm)   Pulse 77   Temp 97.9 F (36.6 C) (Oral)   Resp 18   Ht 4\' 11"  (1.499 m)   Wt 40.8 kg   SpO2 100%   BMI 18.18 kg/m   Physical Exam Vitals and nursing note reviewed.  Constitutional:      General: She is not in acute distress.    Appearance: Normal appearance.  HENT:     Head: Normocephalic and atraumatic.     Nose: Nose normal.  Eyes:     Conjunctiva/sclera: Conjunctivae normal.     Pupils: Pupils are equal, round, and reactive to light.  Cardiovascular:     Rate and Rhythm: Normal rate and regular rhythm.     Pulses: Normal pulses.     Heart sounds: Normal heart sounds.  Pulmonary:     Effort: Pulmonary effort is normal.     Breath sounds: Normal breath sounds.  Abdominal:     General: Abdomen is flat. Bowel sounds are normal.     Palpations: Abdomen is soft.     Tenderness: There is no abdominal tenderness. There is no guarding or rebound.  Musculoskeletal:         General: Normal range of motion.     Cervical back: Normal range of motion and neck supple.  Skin:    General: Skin is warm and dry.     Capillary Refill: Capillary refill takes less than 2 seconds.  Neurological:     General: No focal deficit present.     Mental Status: She is alert.     Deep Tendon Reflexes: Reflexes normal.  Psychiatric:        Mood and Affect: Mood normal.    ED Results / Procedures / Treatments   Labs (all labs ordered are listed, but only abnormal results are displayed) Results for orders placed or performed during the hospital encounter of  08/13/21  CBC with Differential  Result Value Ref Range   WBC 5.4 4.0 - 10.5 K/uL   RBC 4.47 3.87 - 5.11 MIL/uL   Hemoglobin 10.3 (L) 12.0 - 15.0 g/dL   HCT 78.9 (L) 38.1 - 01.7 %   MCV 73.2 (L) 80.0 - 100.0 fL   MCH 23.0 (L) 26.0 - 34.0 pg   MCHC 31.5 30.0 - 36.0 g/dL   RDW 51.0 25.8 - 52.7 %   Platelets 227 150 - 400 K/uL   nRBC 0.0 0.0 - 0.2 %   Neutrophils Relative % 47 %   Neutro Abs 2.5 1.7 - 7.7 K/uL   Lymphocytes Relative 44 %   Lymphs Abs 2.4 0.7 - 4.0 K/uL   Monocytes Relative 7 %   Monocytes Absolute 0.4 0.1 - 1.0 K/uL   Eosinophils Relative 2 %   Eosinophils Absolute 0.1 0.0 - 0.5 K/uL   Basophils Relative 0 %   Basophils Absolute 0.0 0.0 - 0.1 K/uL   Immature Granulocytes 0 %   Abs Immature Granulocytes 0.00 0.00 - 0.07 K/uL  Comprehensive metabolic panel  Result Value Ref Range   Sodium 139 135 - 145 mmol/L   Potassium 2.6 (LL) 3.5 - 5.1 mmol/L   Chloride 101 98 - 111 mmol/L   CO2 31 22 - 32 mmol/L   Glucose, Bld 97 70 - 99 mg/dL   BUN 7 6 - 20 mg/dL   Creatinine, Ser 7.82 0.44 - 1.00 mg/dL   Calcium 9.2 8.9 - 42.3 mg/dL   Total Protein 6.6 6.5 - 8.1 g/dL   Albumin 3.7 3.5 - 5.0 g/dL   AST 17 15 - 41 U/L   ALT 11 0 - 44 U/L   Alkaline Phosphatase 77 38 - 126 U/L   Total Bilirubin 0.7 0.3 - 1.2 mg/dL   GFR, Estimated >53 >61 mL/min   Anion gap 7 5 - 15  Lipase, blood  Result Value  Ref Range   Lipase 35 11 - 51 U/L  Urinalysis, Routine w reflex microscopic Urine, Clean Catch  Result Value Ref Range   Color, Urine STRAW (A) YELLOW   APPearance CLEAR CLEAR   Specific Gravity, Urine 1.003 (L) 1.005 - 1.030   pH 8.0 5.0 - 8.0   Glucose, UA NEGATIVE NEGATIVE mg/dL   Hgb urine dipstick SMALL (A) NEGATIVE   Bilirubin Urine NEGATIVE NEGATIVE   Ketones, ur NEGATIVE NEGATIVE mg/dL   Protein, ur NEGATIVE NEGATIVE mg/dL   Nitrite NEGATIVE NEGATIVE   Leukocytes,Ua SMALL (A) NEGATIVE   RBC / HPF 0-5 0 - 5 RBC/hpf   WBC, UA 0-5 0 - 5 WBC/hpf   Bacteria, UA NONE SEEN NONE SEEN   Squamous Epithelial / LPF 0-5 0 - 5   CT Abdomen Pelvis W Contrast  Result Date: 08/14/2021 CLINICAL DATA:  Acute abdominal pain, nonlocalized EXAM: CT ABDOMEN AND PELVIS WITH CONTRAST TECHNIQUE: Multidetector CT imaging of the abdomen and pelvis was performed using the standard protocol following bolus administration of intravenous contrast. CONTRAST:  64mL OMNIPAQUE IOHEXOL 350 MG/ML SOLN COMPARISON:  03/18/2021 FINDINGS: Lower chest: Lung bases are clear. Hepatobiliary: No focal liver abnormality is seen. No gallstones, gallbladder wall thickening, or biliary dilatation. Pancreas: Unremarkable. No pancreatic ductal dilatation or surrounding inflammatory changes. Spleen: Dense area of calcification in the spleen measuring 2.2 cm diameter. No change since prior study. This is likely calcification and area of old infection or hematoma. Adrenals/Urinary Tract: No adrenal gland nodules. Kidneys are symmetrical with homogeneous nephrograms.  No hydronephrosis or hydroureter. Bladder is normal. Stomach/Bowel: Stomach, small bowel, and colon are not abnormally distended. No wall thickening or inflammatory changes. Anastomosis in the sigmoid colon. Appendix is not identified. Large calcification in the right lower quadrant mesentery is unchanged since prior study, likely representing an area of old infection or  hematoma. Vascular/Lymphatic: Aortic atherosclerosis. No enlarged abdominal or pelvic lymph nodes. Reproductive: Status post hysterectomy. No adnexal masses. Other: No abdominal wall hernia or abnormality. No abdominopelvic ascites. Musculoskeletal: No acute or significant osseous findings. IMPRESSION: 1. No acute process demonstrated in the abdomen or pelvis. No evidence of bowel obstruction or inflammation. 2. Large coarse calcifications in the spleen and right lower quadrant mesentery likely arise from previous infection or hematoma. 3. Aortic atherosclerosis. Electronically Signed   By: Burman Nieves M.D.   On: 08/14/2021 02:53    None  Radiology CT Abdomen Pelvis W Contrast  Result Date: 08/14/2021 CLINICAL DATA:  Acute abdominal pain, nonlocalized EXAM: CT ABDOMEN AND PELVIS WITH CONTRAST TECHNIQUE: Multidetector CT imaging of the abdomen and pelvis was performed using the standard protocol following bolus administration of intravenous contrast. CONTRAST:  64mL OMNIPAQUE IOHEXOL 350 MG/ML SOLN COMPARISON:  03/18/2021 FINDINGS: Lower chest: Lung bases are clear. Hepatobiliary: No focal liver abnormality is seen. No gallstones, gallbladder wall thickening, or biliary dilatation. Pancreas: Unremarkable. No pancreatic ductal dilatation or surrounding inflammatory changes. Spleen: Dense area of calcification in the spleen measuring 2.2 cm diameter. No change since prior study. This is likely calcification and area of old infection or hematoma. Adrenals/Urinary Tract: No adrenal gland nodules. Kidneys are symmetrical with homogeneous nephrograms. No hydronephrosis or hydroureter. Bladder is normal. Stomach/Bowel: Stomach, small bowel, and colon are not abnormally distended. No wall thickening or inflammatory changes. Anastomosis in the sigmoid colon. Appendix is not identified. Large calcification in the right lower quadrant mesentery is unchanged since prior study, likely representing an area of old  infection or hematoma. Vascular/Lymphatic: Aortic atherosclerosis. No enlarged abdominal or pelvic lymph nodes. Reproductive: Status post hysterectomy. No adnexal masses. Other: No abdominal wall hernia or abnormality. No abdominopelvic ascites. Musculoskeletal: No acute or significant osseous findings. IMPRESSION: 1. No acute process demonstrated in the abdomen or pelvis. No evidence of bowel obstruction or inflammation. 2. Large coarse calcifications in the spleen and right lower quadrant mesentery likely arise from previous infection or hematoma. 3. Aortic atherosclerosis. Electronically Signed   By: Burman Nieves M.D.   On: 08/14/2021 02:53    Procedures Procedures   Medications Ordered in ED Medications  potassium chloride SA (KLOR-CON) CR tablet 80 mEq (has no administration in time range)  acetaminophen (TYLENOL) tablet 1,000 mg (has no administration in time range)  ondansetron (ZOFRAN) injection 4 mg (has no administration in time range)  sodium chloride 0.9 % bolus 500 mL (has no administration in time range)  alum & mag hydroxide-simeth (MAALOX/MYLANTA) 200-200-20 MG/5ML suspension 30 mL (30 mLs Oral Given 08/14/21 0200)  magnesium sulfate IVPB 1 g 100 mL (1 g Intravenous New Bag/Given 08/14/21 0204)  iohexol (OMNIPAQUE) 350 MG/ML injection 80 mL (80 mLs Intravenous Contrast Given 08/14/21 0246)    ED Course  I have reviewed the triage vital signs and the nursing notes.  Pertinent labs & imaging results that were available during my care of the patient were reviewed by me and considered in my medical decision making (see chart for details).  255 Case d/w family practice.  We do not do colonospcy or mammography in the ED.  FP will follow up  with patient to arrange these tests as an outpatient/.     PO challenged successfully.     Pati Adderley was evaluated in Emergency Department on 08/14/2021 for the symptoms described in the history of present illness. She was evaluated in the  context of the global COVID-19 pandemic, which necessitated consideration that the patient might be at risk for infection with the SARS-CoV-2 virus that causes COVID-19. Institutional protocols and algorithms that pertain to the evaluation of patients at risk for COVID-19 are in a state of rapid change based on information released by regulatory bodies including the CDC and federal and state organizations. These policies and algorithms were followed during the patient's care in the ED.  Final Clinical Impression(s) / ED Diagnoses Final diagnoses:  Hypokalemia  Abdominal pain, unspecified abdominal location  Return for intractable cough, coughing up blood, fevers > 100.4 unrelieved by medication, shortness of breath, intractable vomiting, chest pain, shortness of breath, weakness, numbness, changes in speech, facial asymmetry, abdominal pain, passing out, Inability to tolerate liquids or food, cough, altered mental status or any concerns. No signs of systemic illness or infection. The patient is nontoxic-appearing on exam and vital signs are within normal limits. I have reviewed the triage vital signs and the nursing notes. Pertinent labs & imaging results that were available during my care of the patient were reviewed by me and considered in my medical decision making (see chart for details). After history, exam, and medical workup I feel the patient has been appropriately medically screened and is safe for discharge home. Pertinent diagnoses were discussed with the patient. Patient was given return precautions. Rx / DC Orders ED Discharge Orders          Ordered    potassium chloride SA (KLOR-CON) 20 MEQ tablet  2 times daily        08/14/21 0022    omeprazole (PRILOSEC) 20 MG capsule  Daily        08/14/21 0023             Miaisabella Bacorn, MD 08/14/21 [id number]

## 2021-08-17 ENCOUNTER — Other Ambulatory Visit: Payer: Self-pay

## 2021-08-17 ENCOUNTER — Encounter (HOSPITAL_COMMUNITY): Payer: Self-pay

## 2021-08-17 ENCOUNTER — Emergency Department (HOSPITAL_COMMUNITY)
Admission: EM | Admit: 2021-08-17 | Discharge: 2021-08-18 | Disposition: A | Discharge status: Home / Self Care | Payer: 59 | Attending: Emergency Medicine | Admitting: Emergency Medicine

## 2021-08-17 DIAGNOSIS — F17210 Nicotine dependence, cigarettes, uncomplicated: Secondary | ICD-10-CM | POA: Insufficient documentation

## 2021-08-17 DIAGNOSIS — R109 Unspecified abdominal pain: Secondary | ICD-10-CM | POA: Diagnosis not present

## 2021-08-17 DIAGNOSIS — R110 Nausea: Secondary | ICD-10-CM | POA: Insufficient documentation

## 2021-08-17 LAB — URINALYSIS, MICROSCOPIC (REFLEX): Bacteria, UA: NONE SEEN

## 2021-08-17 LAB — URINALYSIS, ROUTINE W REFLEX MICROSCOPIC
Bilirubin Urine: NEGATIVE
Glucose, UA: NEGATIVE mg/dL
Ketones, ur: NEGATIVE mg/dL
Nitrite: NEGATIVE
Protein, ur: NEGATIVE mg/dL
Specific Gravity, Urine: 1.01 (ref 1.005–1.030)
pH: 5.5 (ref 5.0–8.0)

## 2021-08-17 LAB — COMPREHENSIVE METABOLIC PANEL
ALT: 15 U/L (ref 0–44)
AST: 21 U/L (ref 15–41)
Albumin: 5.1 g/dL — ABNORMAL HIGH (ref 3.5–5.0)
Alkaline Phosphatase: 87 U/L (ref 38–126)
Anion gap: 10 (ref 5–15)
BUN: 18 mg/dL (ref 6–20)
CO2: 28 mmol/L (ref 22–32)
Calcium: 10.5 mg/dL — ABNORMAL HIGH (ref 8.9–10.3)
Chloride: 102 mmol/L (ref 98–111)
Creatinine, Ser: 0.81 mg/dL (ref 0.44–1.00)
GFR, Estimated: >60 mL/min (ref >60)
Glucose, Bld: 88 mg/dL (ref 70–99)
Potassium: 4.9 mmol/L (ref 3.5–5.1)
Sodium: 140 mmol/L (ref 135–145)
Total Bilirubin: 0.6 mg/dL (ref 0.3–1.2)
Total Protein: 8.3 g/dL — ABNORMAL HIGH (ref 6.5–8.1)

## 2021-08-17 LAB — CBC WITH DIFFERENTIAL/PLATELET
Abs Immature Granulocytes: 0.02 10*3/uL (ref 0.00–0.07)
Basophils Absolute: 0 10*3/uL (ref 0.0–0.1)
Basophils Relative: 0 %
Eosinophils Absolute: 0 10*3/uL (ref 0.0–0.5)
Eosinophils Relative: 1 %
HCT: 40.3 % (ref 36.0–46.0)
Hemoglobin: 12.5 g/dL (ref 12.0–15.0)
Immature Granulocytes: 0 %
Lymphocytes Relative: 28 %
Lymphs Abs: 2.1 10*3/uL (ref 0.7–4.0)
MCH: 22.6 pg — ABNORMAL LOW (ref 26.0–34.0)
MCHC: 31 g/dL (ref 30.0–36.0)
MCV: 72.7 fL — ABNORMAL LOW (ref 80.0–100.0)
Monocytes Absolute: 0.4 10*3/uL (ref 0.1–1.0)
Monocytes Relative: 5 %
Neutro Abs: 5 10*3/uL (ref 1.7–7.7)
Neutrophils Relative %: 66 %
Platelets: 240 10*3/uL (ref 150–400)
RBC: 5.54 MIL/uL — ABNORMAL HIGH (ref 3.87–5.11)
RDW: 13.5 % (ref 11.5–15.5)
WBC: 7.6 10*3/uL (ref 4.0–10.5)
nRBC: 0 % (ref 0.0–0.2)

## 2021-08-17 LAB — LIPASE, BLOOD: Lipase: 34 U/L (ref 11–51)

## 2021-08-17 MED ORDER — FAMOTIDINE IN NACL 20-0.9 MG/50ML-% IV SOLN
20.0000 mg | Freq: Once | INTRAVENOUS | Status: AC
  Administered 2021-08-17: 20 mg via INTRAVENOUS
  Filled 2021-08-17: qty 50

## 2021-08-17 MED ORDER — SODIUM CHLORIDE 0.9 % IV BOLUS
1000.0000 mL | Freq: Once | INTRAVENOUS | Status: AC
  Administered 2021-08-17: 1000 mL via INTRAVENOUS

## 2021-08-17 MED ORDER — FENTANYL CITRATE PF 50 MCG/ML IJ SOSY
50.0000 ug | PREFILLED_SYRINGE | Freq: Once | INTRAMUSCULAR | Status: AC
  Administered 2021-08-17: 50 ug via INTRAVENOUS
  Filled 2021-08-17: qty 1

## 2021-08-17 NOTE — ED Provider Notes (Signed)
Emergency Medicine Provider Triage Evaluation Note  [name] [name] , a 60 y.o. female  was evaluated in triage.  Pt complains of abdominal pain.  Has been present for many years, but worse today.  Seen recently for the same with reassuring work-up.  States she is taking omeprazole when she has abdominal pain without improvement.  Not taking anything else for her stomach. Has not seen GI before  Review of Systems  Positive: Abd pain Negative: fever  Physical Exam  BP (!) 135/95 (BP Location: Right Arm)   Pulse 74   Temp 98.6 F (37 C) (Oral)   Resp 14   Ht 4\' 11"  (1.499 m)   Wt 40.8 kg   SpO2 98%   BMI 18.18 kg/m  Gen:   Awake, no distress   Resp:  Normal effort  MSK:   Moves extremities without difficulty  Other:  Diffuse mild ttp of the abd  Medical Decision Making  Medically screening exam initiated at 3:29 PM.  Appropriate orders placed.  [name] [name] was informed that the remainder of the evaluation will be completed by another provider, this initial triage assessment does not replace that evaluation, and the importance of remaining in the ED until their evaluation is complete.  Labs. CT from 9/22 normal   10/22, PA-C 08/17/21 1531    08/19/21, MD 08/17/21 [phone]

## 2021-08-17 NOTE — ED Provider Notes (Signed)
[facility]-EMERGENCY DEPT Provider Note   CSN: [id number] Arrival date & time: 08/17/21  1507    History Chief Complaint  Patient presents with   Abdominal Pain    [name] [name] is a 60 y.o. female with a hx of tobacco use, prior SBO, and prior appendectomy, hysterectomy, c-section, laparotomy, colostomy & colostomy takedown who presents to the emergency department with her daughter for evaluation of worsening abdominal pain.  Patient's daughter provided history and acted as Nurse, learning disability for the patient with her consent, no current Engineer, manufacturing systems for this patient's language otherwise.  She relates that the patient has had problems with abdominal pain for 5 to 6 years since a surgical procedure, she has been having problems with worsening pain recently, was seen in the emergency department for same within the past week, however has since has gotten much worse.  Her pain is relatively constant, primarily in the central abdomen, it is most notable after eating, no alleviating factors.  She has had some nausea.  Her last bowel movement was today and was fairly normal.  She denies any fever, hematemesis, melena, hematochezia, or dysuria.  HPI     Past Medical History:  Diagnosis Date   Anemia 07/27/2016   Atypical chest pain 08/07/2020   Colostomy stricture (HCC) 07/02/2015   Constipation    Cough 08/07/2020   Diarrhea 11/13/2020   Acute watery diarrhea starting 12/14 associated with abdominal pain, headache, feeling "hot and cold".    Difficulty sleeping    Headache 07/18/2018   Hypokalemia 06/27/2020   Literacy level of illiterate 10/01/2016   Less than one year of formal education in [place].  Ms [name] [name] is taking classes for reading and writing in [place].    S/P colostomy [facility]) 03/02/2015   March 2016: colon perforation following screening colonoscopy    S/P colostomy [facility]) 03/02/2015   March 2016: colon perforation following screening colonoscopy    SBO (small bowel  obstruction) (HCC) 03/18/2021    Patient Active Problem List   Diagnosis Date Noted   Language barrier affecting health care 08/07/2020   Annual physical exam 06/27/2020   Literacy level of illiterate 10/01/2016   Reduced visual acuity 09/30/2016   Abdominal pain 01/15/2016    Past Surgical History:  Procedure Laterality Date   ABDOMINAL HYSTERECTOMY  1994   APPENDECTOMY  1993   CESAREAN SECTION  1992   COLONOSCOPY N/A 05/30/2015   Procedure: COLONOSCOPY;  Surgeon: Romie Levee, MD;  Location: [facility];  Service: Endoscopy;  Laterality: N/A;   COLONOSCOPY WITH PROPOFOL N/A 06/12/2015   Procedure: COLONOSCOPY WITH PROPOFOL;  Surgeon: Romie Levee, MD;  Location: [facility];  Service: Endoscopy;  Laterality: N/A;   COLOSTOMY  01/2015   COLOSTOMY TAKEDOWN N/A 07/02/2015   Procedure: LAPAROSCOPIC COLOSTOMY REVERSAL SPLENIC FLEXURE MOBILIZATION AND PARTIAL COLECTOMY;  Surgeon: Romie Levee, MD;  Location: [facility]S;  Service: General;  Laterality: N/A;   DILATION AND CURETTAGE OF UTERUS  1990   "miscarriage"   LAPAROTOMY N/A 02/20/2015   Procedure: PARTIAL COLECTOMY END  ILEOSTOMY AND HARTMAN'S PROCEDURE;  Surgeon: Romie Levee, MD;  Location: [facility];  Service: General;  Laterality: N/A;     OB History   No obstetric history on file.     Family History  Problem Relation Age of Onset   Colon cancer Neg Hx     Social History   Tobacco Use   Smoking status: Every Day    Packs/day: 0.50    Years: 37.00  Pack years: 18.50    Types: Cigarettes   Smokeless tobacco: Never  Vaping Use   Vaping Use: Never used  Substance Use Topics   Alcohol use: No    Alcohol/week: 0.0 standard drinks   Drug use: No    Home Medications Prior to Admission medications   Medication Sig Start Date End Date Taking? Authorizing Provider  acetaminophen (TYLENOL) 500 MG tablet Take 1,000 mg by mouth every 6 (six) hours as needed for headache or moderate pain.    [provider]   docusate sodium (COLACE) 100 MG capsule Take 1 capsule (100 mg total) by mouth 2 (two) times daily. 08/06/21   Latrelle Dodrill, MD  omeprazole (PRILOSEC) 20 MG capsule Take 1 capsule (20 mg total) by mouth daily. 08/14/21   Palumbo, April, MD  ondansetron (ZOFRAN ODT) 8 MG disintegrating tablet 8mg  ODT q8 hours prn nausea 08/14/21   Palumbo, April, MD  polyethylene glycol powder (GLYCOLAX/MIRALAX) 17 GM/SCOOP powder Take 17 g by mouth in the morning and at bedtime. For one week to relieve abdominal pain. Afterwards use once daily for maintenance of bowel movements. 04/28/21   06/28/21, MD  potassium chloride SA (KLOR-CON) 20 MEQ tablet Take 1 tablet (20 mEq total) by mouth 2 (two) times daily. 08/14/21   Palumbo, April, MD    Allergies    Patient has no known allergies.  Review of Systems   Review of Systems  Constitutional:  Negative for chills and fever.  Respiratory:  Negative for shortness of breath.   Cardiovascular:  Negative for chest pain.  Gastrointestinal:  Positive for abdominal pain and nausea. Negative for blood in stool and constipation.  Genitourinary:  Negative for dysuria.  Neurological:  Negative for syncope.  All other systems reviewed and are negative.  Physical Exam Updated Vital Signs BP (!) 119/92   Pulse 79   Temp 98.6 F (37 C) (Oral)   Resp 18   Ht 4\' 11"  (1.499 m)   Wt 32 kg   SpO2 99%   BMI 14.24 kg/m   Physical Exam Vitals and nursing note reviewed.  Constitutional:      General: She is not in acute distress.    Appearance: She is well-developed. She is not toxic-appearing.  HENT:     Head: Normocephalic and atraumatic.  Eyes:     General:        Right eye: No discharge.        Left eye: No discharge.     Conjunctiva/sclera: Conjunctivae normal.  Cardiovascular:     Rate and Rhythm: Normal rate and regular rhythm.  Pulmonary:     Effort: Pulmonary effort is normal. No respiratory distress.     Breath sounds: Normal breath sounds.  No wheezing, rhonchi or rales.  Abdominal:     General: A surgical scar is present. There is no distension.     Palpations: Abdomen is soft.     Tenderness: There is abdominal tenderness (generalized, more so to the periumbilical region).  Musculoskeletal:     Cervical back: Neck supple.  Skin:    General: Skin is warm and dry.     Findings: No rash.  Neurological:     Mental Status: She is alert.     Comments: Clear speech.   Psychiatric:        Behavior: Behavior normal.    ED Results / Procedures / Treatments   Labs (all labs ordered are listed, but only abnormal results are displayed) Labs Reviewed  CBC WITH DIFFERENTIAL/PLATELET - Abnormal; Notable for the following components:      Result Value   RBC 5.54 (*)    MCV 72.7 (*)    MCH 22.6 (*)    All other components within normal limits  COMPREHENSIVE METABOLIC PANEL - Abnormal; Notable for the following components:   Calcium 10.5 (*)    Total Protein 8.3 (*)    Albumin 5.1 (*)    All other components within normal limits  LIPASE, BLOOD    EKG None  Radiology CT Abdomen Pelvis W Contrast  Result Date: 08/18/2021 CLINICAL DATA:  Mid and lower abdominal pain EXAM: CT ABDOMEN AND PELVIS WITH CONTRAST TECHNIQUE: Multidetector CT imaging of the abdomen and pelvis was performed using the standard protocol following bolus administration of intravenous contrast. CONTRAST:  20mL OMNIPAQUE IOHEXOL 350 MG/ML SOLN COMPARISON:  08/14/2021 FINDINGS: Lower chest: No acute abnormality. Hepatobiliary: No focal liver abnormality is seen. No gallstones, gallbladder wall thickening, or biliary dilatation. Pancreas: Unremarkable Spleen: Dense calcification within the medial pole of the spleen is unchanged from prior examination likely the sequela of remote infection or trauma. The spleen is otherwise unremarkable. Adrenals/Urinary Tract: Adrenal glands are unremarkable. Kidneys are normal, without renal calculi, focal lesion, or  hydronephrosis. Bladder is unremarkable. Stomach/Bowel: Surgical changes of sigmoid colectomy are identified. Moderate stool seen throughout the colon without evidence of obstruction. The stomach, small bowel, and large bowel are otherwise unremarkable. Appendix normal. Dense calcification within the right lower quadrant mesentery is unchanged, again likely the sequela of remote infection or trauma. No free intraperitoneal gas or fluid. Vascular/Lymphatic: Moderate aortoiliac atherosclerotic calcification. No aortic aneurysm. No pathologic adenopathy within the abdomen and pelvis. Reproductive: Status post hysterectomy. No adnexal masses. Other: No abdominal wall hernia. Musculoskeletal: No acute bone abnormality. No lytic or blastic bone lesion. IMPRESSION: No acute intra-abdominal pathology identified. No definite radiographic explanation for the patient's reported symptoms. Dense calcifications within the medial pole of the spleen and right lower quadrant mesentery again identified, likely the sequela of remote trauma or inflammation. Aortic Atherosclerosis (ICD10-I70.0). Electronically Signed   By: Helyn Numbers M.D.   On: 08/18/2021 02:22    Procedures Procedures   Medications Ordered in ED Medications - No data to display  ED Course  I have reviewed the triage vital signs and the nursing notes.  Pertinent labs & imaging results that were available during my care of the patient were reviewed by me and considered in my medical decision making (see chart for details).    MDM Rules/Calculators/A&P                          Patient presents to the ED with complaints of abdominal pain. Patient nontoxic appearing, in no apparent distress, vitals without significant abnormality . On exam patient tender to the generalized abdomen..  Additional history obtained:  Additional history obtained from chart review & nursing note review.   Lab Tests:  I Ordered, reviewed, and interpreted labs, which  included:  CBC: No significant leukocytosis/anemia.  CMP: Mild hypercalcemia and elevated protein Lipase: WNL UA: moderate leukocytes, denies urinary sxs, no bacteria present.   Imaging Studies ordered:  I ordered imaging studies which included CT A/P, I independently reviewed, formal radiology impression shows:   Analgesics, anti-emetics, and fluids administered.   ED Course:  03:19: RE-EVAL: Patient is feeling much better, repeat abdominal exam remains without peritoneal signs, she is tolerating PO and feels ready to go home. On  repeat abdominal exam patient remains without peritoneal signs, low suspicion for cholecystitis, pancreatitis, diverticulitis, bowel obstruction/perforation, or other acute surgical process. Patient tolerating PO in the emergency department. Will discharge home with supportive measures. I discussed results, treatment plan, need for PCP/GI follow-up, and return precautions with the patient. Provided opportunity for questions, patient confirmed understanding and is in agreement with plan.    Portions of this note were generated with Scientist, clinical (histocompatibility and immunogenetics). Dictation errors may occur despite best attempts at proofreading.    Final Clinical Impression(s) / ED Diagnoses Final diagnoses:  Abdominal pain, unspecified abdominal location    Rx / DC Orders ED Discharge Orders          Ordered    sucralfate (CARAFATE) 1 GM/10ML suspension  3 times daily with meals & bedtime        08/18/21 0325             Cherly Anderson, PA-C 08/18/21 0404    Wynetta Fines, MD 08/20/21 [phone]

## 2021-08-17 NOTE — ED Triage Notes (Signed)
Patient c/o lower abdominal pain since having a colonoscopy 5-6 years ago, but worse now. Patient was seen at [facility] ED on 08/13/21. Patient's daughter reports poor po intake.

## 2021-08-18 ENCOUNTER — Encounter: Payer: Self-pay | Admitting: Gastroenterology

## 2021-08-18 ENCOUNTER — Emergency Department (HOSPITAL_COMMUNITY): Payer: 59

## 2021-08-18 DIAGNOSIS — R110 Nausea: Secondary | ICD-10-CM | POA: Diagnosis not present

## 2021-08-18 DIAGNOSIS — R109 Unspecified abdominal pain: Secondary | ICD-10-CM | POA: Diagnosis not present

## 2021-08-18 DIAGNOSIS — F17210 Nicotine dependence, cigarettes, uncomplicated: Secondary | ICD-10-CM | POA: Diagnosis not present

## 2021-08-18 MED ORDER — IOHEXOL 350 MG/ML SOLN
70.0000 mL | Freq: Once | INTRAVENOUS | Status: AC | PRN
  Administered 2021-08-18: 70 mL via INTRAVENOUS

## 2021-08-18 MED ORDER — SUCRALFATE 1 GM/10ML PO SUSP: 1.0000 g | Freq: Three times a day (TID) | ORAL | 0 refills | Status: DC

## 2021-08-18 MED ORDER — LIDOCAINE VISCOUS HCL 2 % MT SOLN
15.0000 mL | Freq: Once | OROMUCOSAL | Status: AC
  Administered 2021-08-18: 15 mL via ORAL
  Filled 2021-08-18: qty 15

## 2021-08-18 MED ORDER — ALUM & MAG HYDROXIDE-SIMETH 200-200-20 MG/5ML PO SUSP
30.0000 mL | Freq: Once | ORAL | Status: AC
  Administered 2021-08-18: 30 mL via ORAL
  Filled 2021-08-18: qty 30

## 2021-08-18 NOTE — Discharge Instructions (Addendum)
You were seen in the ER today for abdominal pain. Your labs showed that your calcium level and protein level were elevated - please have these rechecked by your doctor. Your CT did not show any new problems compared to prior imaging.   Please be sure to take the Protonix that was prescribed at your prior ER visit.  We are sending you home with Carafate to take prior to meals and prior to bedtime to help with stomach upset.  We would like you to follow-up with a GI specialist, please call [facility] to schedule an appointment   We have prescribed you new medication(s) today. Discuss the medications prescribed today with your pharmacist as they can have adverse effects and interactions with your other medicines including over the counter and prescribed medications. Seek medical evaluation if you start to experience new or abnormal symptoms after taking one of these medicines, seek care immediately if you start to experience difficulty breathing, feeling of your throat closing, facial swelling, or rash as these could be indications of a more serious allergic reaction  Follow attached diet guidelines.  Follow-up with your primary care provider soon as possible as well.  Return to the emergency department for new or worsening symptoms including but not limited to new or worsening pain, fever, inability to keep fluids down, blood in vomit or stool or any other concerns

## 2021-08-18 NOTE — ED Notes (Signed)
Pt care taken complaining of having a burning sensation in the center of abdomen

## 2021-08-20 ENCOUNTER — Other Ambulatory Visit: Payer: Self-pay

## 2021-08-20 ENCOUNTER — Ambulatory Visit (INDEPENDENT_AMBULATORY_CARE_PROVIDER_SITE_OTHER): Payer: 59 | Admitting: Family Medicine

## 2021-08-20 VITALS — BP 128/75 | HR 76 | Ht 59.0 in | Wt 72.0 lb

## 2021-08-20 DIAGNOSIS — R109 Unspecified abdominal pain: Secondary | ICD-10-CM

## 2021-08-20 NOTE — Progress Notes (Signed)
    SUBJECTIVE:   CHIEF COMPLAINT / HPI: abdominal pain  Recently seen in ED for similar symptoms.  Work up negative for acute abdomen.  Was prescribed Sulcrafate and seems to be helping.  Has GI appointment scheduled with [facility] in October. Has history of constipation but has moved bowels today   PERTINENT  PMH / PSH:  Colon perforation s/p coloscopy 2016  OBJECTIVE:   BP 128/75   Pulse 76   Ht 4\' 11"  (1.499 m)   Wt 72 lb (32.7 kg)   SpO2 100%   BMI 14.54 kg/m    General: Alert, no acute distress Cardio: Normal S1 and S2, RRR, no r/m/g Pulm: CTAB, normal work of breathing Abdomen: Bowel sounds normal. Abdomen soft and mild tenderness diffusely.  No peritoneal signs.  Extremities: No peripheral edema.   ASSESSMENT/PLAN:   Abdominal pain Recent workup negative for acute abdomen.  Sulcrafate helping.  Patient KCL tablet for history of hypokalemia.  Most recent K 4.9. -Stop KCL -Continue Sulcrafate -Follow up with GI as scheduled -Follow up with PCP as needed     , MD [facility]

## 2021-08-20 NOTE — Patient Instructions (Addendum)
Thank you for coming to see me today. It was a pleasure.   Please Call [facility] schedule an appointment [phone]   Stop taking Potassium Chloride Stop taking Docusate Stop taking Ondansetron Stop taking Tizanidine  Continue Tylenol as needed Continue Omeprazole Continue Miralax Continue Sulcrafate  Please follow-up with PCP as needed.  If you have any questions or concerns, please do not hesitate to call the office at [phone].  Best,   Dana Allan, MD

## 2021-08-24 ENCOUNTER — Encounter: Payer: Self-pay | Admitting: Family Medicine

## 2021-08-24 NOTE — Assessment & Plan Note (Signed)
Recent workup negative for acute abdomen.  Sulcrafate helping.  Patient [name] [name] tablet for history of hypokalemia.  Most recent K 4.9. -Stop [name] [name] -Continue Sulcrafate -Follow up with GI as scheduled -Follow up with PCP as needed

## 2021-08-28 ENCOUNTER — Ambulatory Visit (INDEPENDENT_AMBULATORY_CARE_PROVIDER_SITE_OTHER): Payer: 59 | Admitting: Gastroenterology

## 2021-08-28 ENCOUNTER — Encounter: Payer: Self-pay | Admitting: Gastroenterology

## 2021-08-28 VITALS — BP 120/80 | HR 82 | Ht 59.0 in | Wt 73.4 lb

## 2021-08-28 DIAGNOSIS — R1010 Upper abdominal pain, unspecified: Secondary | ICD-10-CM

## 2021-08-28 DIAGNOSIS — Z8719 Personal history of other diseases of the digestive system: Secondary | ICD-10-CM | POA: Diagnosis not present

## 2021-08-28 NOTE — Patient Instructions (Signed)
You have been scheduled for an appointment with Dr Romie Levee at [facility]. Your appointment is on 09/22/21 at 8:30 am. Make certain to bring a list of current medications, including any over the counter medications or vitamins. Also bring your co-pay if you have one as well as your insurance cards. [facility] is located at [address], [address]. Should you need to reschedule your appointment, please contact them at [phone]. ________________________________________________________  The [facility] providers would like to encourage you to use [facility] to communicate with providers for non-urgent requests or questions.  Due to long hold times on the telephone, sending your provider a message by [facility] may be a faster and more efficient way to get a response.  Please allow 48 business hours for a response.  Please remember that this is for non-urgent requests.  ________________________________________________________  Due to recent changes in healthcare laws, you may see the results of your imaging and laboratory studies on MyChart before your provider has had a chance to review them.  We understand that in some cases there may be results that are confusing or concerning to you. Not all laboratory results come back in the same time frame and the provider may be waiting for multiple results in order to interpret others.  Please give [place] 48 hours in order for your provider to thoroughly review all the results before contacting the office for clarification of your results.  ---------------------------------------------------------------------------------------------- B?n ? ???c s?p x?p m?t cu?c h?n v?i Bc s? Romie Levee t?i Trung tm Ph?u thu?t [place]. Cu?c h?n c?a b?n l vo ngy 22/11 lc 8:30 sng. ??m b?o mang theo danh sch cc lo?i thu?c hi?n t?i, bao g?m b?t k? lo?i thu?c khng k ??n ho?c vitamin no. C?ng mang theo ??ng thanh ton n?u b?n c c?ng nh? th?  b?o hi?m c?a b?n. Trung tm Ph?u thu?t [place] t?a l?c t?i [address]. N?u b?n c?n s?p x?p l?i cu?c h?n, vui lng lin h? v?i h? theo s? [phone]. _____________________________________________  Cc nh cung c?p [facility] mu?n khuy?n khch b?n s? d?ng MYCHART ?? lin l?c v?i cc nh cung c?p cho cc yu c?u ho?c cu h?i khng kh?n c?p. Do th?i gian gi? ?i?n tho?i lu, g?i tin nh?n cho nh cung c?p c?a b?n b?ng MYCHART c th? l cch nhanh h?n v hi?u qu? h?n ?? nh?n ???c ph?n h?i. Vui lng ??i 48 gi? lm vi?c ?? c ph?n h?i. Hy nh? r?ng ?i?u ny dnh cho cc yu c?u khng kh?n c?p. ________________________________________________________  Do nh?ng thay ??i g?n ?y trong lu?t ch?m [place] s?c kh?e, b?n c th? xem k?t qu? nghin c?u hnh ?nh v phng th nghi?m c?a mnh trn MyChart tr??c khi nh cung c?p c?a b?n c c? h?i xem xt chng. Chng ti hi?u r?ng trong m?t s? tr??ng h?p, c th? c k?t qu? gy nh?m l?n ho?c lin quan ??n b?n. Khng ph?i t?t c? cc k?t qu? phng th nghi?m ??u tr? v? trong cng m?t khung th?i gian v nh cung c?p c th? ?ang ??i nhi?u k?t qu? ?? gi?i thch nh?ng k?t qu? khc. Vui lng cho chng ti 48 gi? ?? nh cung c?p c?a b?n xem xt k? l??ng t?t c? cc k?t qu? tr??c khi lin h? v?i v?n phng ?? lm r k?t qu? c?a b?n.

## 2021-08-28 NOTE — Progress Notes (Signed)
Referring Provider: Fayette Pho, MD Primary Care Physician:  [name] Pho, MD   Reason for Consultation:  Abdominal pain   IMPRESSION:  Recent SBO due to adhesive disease, now with near chronic post-prandial abdominal pain. Description of symptoms sounds like altered anatomy/adhesions given symptom onset following colectomy. However, concurrent GI disease such as H pylori, PUD, esophagitis, gastritis, GOO must be considered. The patient and her daughter are very concerned about the possibility of recurrent SBO. Unfortunately, minimal options for prevention of recurrent SBO.   Moderate stool burden on CT with chronic constipation: Continue daily Miralax. Consider addition of Linzess given ongoing symptoms.   Recent hyercalcemia and elevated total protein and albumin: Recommend close follow-up with PCP. May need SPEP and UPEP. '  Colon cancer screening: Had a colonoscopy with Dr. Maisie [name] in 2016. She recommended surveillance in 10 years.    PLAN: - Continue Carafate  - Increase omeprazole to 40 mg daily - EGD with esophageal, gastric, and duodenal biopsies at her convenience - Continue to use Miralax daily - Follow-up with surgery with recurrent SBO  - Follow-up with Dr. Larita [name]: hypercalcemia and elevated albumin and total protein noted on ER labs   HPI: [name] [name] is a 60 y.o. female presents for evaluation of post-prandial burning and upper abdominal pain that occurs as soon as she starts eating. She was referred by Dr. Larita [name]. The history is obtained through the patient, review of her electronic health record, and through her daughter who lives with her and accompanies her to this appointment with the assistance of a [name] [name]. She loves her job at [employer] in [place].   [place] in 1991 after a miscarriage. But, did not have GI symptoms until 2016 when she near daily abdominal pain after her colectomy.   Colon  perforation happened during a screening colonoscopy in 2016. She underwent colectomy with colostomy and subsequent reversal with Dr. Maisie [name] in 2016. She had a normal colonoscopy with Dr. Maisie [name] 06/12/15.  Hospitalized in April 2022 for CT-diagnosed partial small bowel obstruction that resolved with small bowel obstruction protocol.   Post-prandial upper abdominal pain and burning initially developed in 2016 but have been worse over the last year. Pain occurs sometime after eating - unable to delineate a time frame. Symptoms controlled without eating. There is no associated nausea, heartburn, dysphagia, odynophagia, brash, or blood in the stool.  The pain has taken her to the ED multiple times, most recently 08/17/21. She had two CT scans last month that did not identify a cause for pain. At that time she had a normal CMP except for calcium 10.5, alb 5.1, total protein 8.3. Although her hemoglobin is normal, her MCV is 72.7. Platelets 240.   She is having a bowel movement twice daily on Miralax.  Without Miralax she will go at least 2-3 days between bowel movements.   There are no new medications. She does not use any NSAIDs.   There is no family history of colon cancer or polyps.   Recent abdominal imaging: - CT 03/18/21: small bowel obstruction in the lower abdomen, evidence for prior surgery - CT 08/14/21: No acute process; large calcification in the spleen and RLQ mesentery - CT 08/18/21: Moderate stool throughout the colon without obstruction, changes in the spleen and right lower quadrant - likely sequela of remote trauma or inflammation   Past Medical History:  Diagnosis Date   Anemia 07/27/2016   Atypical chest pain 08/07/2020   Colostomy  stricture (HCC) 07/02/2015   Constipation    Cough 08/07/2020   Diarrhea 11/13/2020   Acute watery diarrhea starting 12/14 associated with abdominal pain, headache, feeling "hot and cold".    Difficulty sleeping    Headache 07/18/2018   Hypokalemia  06/27/2020   Literacy level of illiterate 10/01/2016   Less than one year of formal education in [place].  Ms [name] [name] is taking classes for reading and writing in [place].    S/P colostomy [facility]) 03/02/2015   March 2016: colon perforation following screening colonoscopy    S/P colostomy [facility]) 03/02/2015   March 2016: colon perforation following screening colonoscopy    SBO (small bowel obstruction) (HCC) 03/18/2021    Past Surgical History:  Procedure Laterality Date   ABDOMINAL HYSTERECTOMY  1994   APPENDECTOMY  1993   CESAREAN SECTION  1992   COLONOSCOPY N/A 05/30/2015   Procedure: COLONOSCOPY;  Surgeon: Romie Levee, MD;  Location: [facility];  Service: Endoscopy;  Laterality: N/A;   COLONOSCOPY WITH PROPOFOL N/A 06/12/2015   Procedure: COLONOSCOPY WITH PROPOFOL;  Surgeon: Romie Levee, MD;  Location: [facility];  Service: Endoscopy;  Laterality: N/A;   COLOSTOMY  01/2015   COLOSTOMY TAKEDOWN N/A 07/02/2015   Procedure: LAPAROSCOPIC COLOSTOMY REVERSAL SPLENIC FLEXURE MOBILIZATION AND PARTIAL COLECTOMY;  Surgeon: Romie Levee, MD;  Location: [facility];  Service: General;  Laterality: N/A;   DILATION AND CURETTAGE OF UTERUS  1990   "miscarriage"   LAPAROTOMY N/A 02/20/2015   Procedure: PARTIAL COLECTOMY END  ILEOSTOMY AND HARTMAN'S PROCEDURE;  Surgeon: Romie Levee, MD;  Location: [facility];  Service: General;  Laterality: N/A;     Current Outpatient Medications  Medication Sig Dispense Refill   acetaminophen (TYLENOL) 500 MG tablet Take 1,000 mg by mouth every 6 (six) hours as needed for headache or moderate pain.     omeprazole (PRILOSEC) 20 MG capsule Take 1 capsule (20 mg total) by mouth daily. 30 capsule 0   polyethylene glycol powder (GLYCOLAX/MIRALAX) 17 GM/SCOOP powder Take 17 g by mouth in the morning and at bedtime. For one week to relieve abdominal pain. Afterwards use once daily for maintenance of bowel movements. 3350 g 1   sucralfate (CARAFATE) 1 GM/10ML suspension Take 10 mLs (1 g  total) by mouth 4 (four) times daily -  with meals and at bedtime. 420 mL 0   No current facility-administered medications for this visit.    Allergies as of 08/28/2021   (No Known Allergies)    Family History  Problem Relation Age of Onset   Colon cancer Neg Hx        Review of Systems: 12 system ROS is negative except as noted above.   Physical Exam: General:   Alert,  well-nourished, pleasant and cooperative in NAD Head:  Normocephalic and atraumatic. Eyes:  Sclera clear, no icterus.   Conjunctiva pink. Ears:  Normal auditory acuity. Nose:  No deformity, discharge,  or lesions. Mouth:  No deformity or lesions.   Neck:  Supple; no masses or thyromegaly. Lungs:  Clear throughout to auscultation.   No wheezes. Heart:  Regular rate and rhythm; no murmurs. Abdomen:  Soft, thin, scaphoid, multiple surgical scars, nontender, nondistended, normal bowel sounds, no rebound or guarding. No hepatosplenomegaly.   Rectal:  Deferred  Msk:  Symmetrical. No boney deformities LAD: No inguinal or umbilical LAD Extremities:  No clubbing or edema. Neurologic:  Alert and  oriented x4;  grossly nonfocal Skin:  Intact without significant lesions or rashes. Psych:  Alert and  cooperative. Normal mood and affect.   Teila Skalsky L. Orvan Falconer, MD, MPH 09/03/2021, 5:28 AM

## 2021-09-03 ENCOUNTER — Telehealth: Payer: Self-pay

## 2021-09-03 ENCOUNTER — Encounter: Payer: Self-pay | Admitting: Gastroenterology

## 2021-09-03 ENCOUNTER — Other Ambulatory Visit: Payer: Self-pay

## 2021-09-03 DIAGNOSIS — R1013 Epigastric pain: Secondary | ICD-10-CM

## 2021-09-03 NOTE — Telephone Encounter (Signed)
Per Dr. Orvan Falconer request, called pt to schedule for EGD on 09/09/21 @ 10am @ [facility], arrival time 9am. While on the phone, dtr advised she will be pt caregiver. Also asked to stop by our office to pick up prep instructions. Instructions have been printed and placed at front desk for pick up. Amb referral placed for auth purposes.

## 2021-09-03 NOTE — Telephone Encounter (Signed)
-----   Message from Deon Pilling, LPN sent at [phone] 11:20 AM EDT ----- Regarding: Schedule EGD Call pt to schedule EGD post-prandial abdominal pain

## 2021-09-09 ENCOUNTER — Encounter: Payer: Self-pay | Admitting: Gastroenterology

## 2021-09-09 ENCOUNTER — Ambulatory Visit (AMBULATORY_SURGERY_CENTER): Payer: 59 | Admitting: Gastroenterology

## 2021-09-09 VITALS — BP 142/81 | HR 56 | Temp 97.1°F | Resp 15 | Ht 59.0 in | Wt 73.0 lb

## 2021-09-09 DIAGNOSIS — K2950 Unspecified chronic gastritis without bleeding: Secondary | ICD-10-CM | POA: Diagnosis not present

## 2021-09-09 DIAGNOSIS — K2970 Gastritis, unspecified, without bleeding: Secondary | ICD-10-CM

## 2021-09-09 DIAGNOSIS — K2980 Duodenitis without bleeding: Secondary | ICD-10-CM | POA: Diagnosis not present

## 2021-09-09 DIAGNOSIS — R1013 Epigastric pain: Secondary | ICD-10-CM

## 2021-09-09 DIAGNOSIS — F32A Depression, unspecified: Secondary | ICD-10-CM | POA: Diagnosis not present

## 2021-09-09 MED ORDER — SODIUM CHLORIDE 0.9 % IV SOLN
500.0000 mL | Freq: Once | INTRAVENOUS | Status: DC
Start: 2021-09-09 — End: 2021-09-09

## 2021-09-09 NOTE — Op Note (Addendum)
[facility] Patient Name: [name] [name] Procedure Date: 09/09/2021 10:51 AM MRN: [id number] Endoscopist: Tressia Danas MD, MD Age: 60 Referring MD:  Date of Birth: Aug 14, 1961 Gender: Female Account #: [id number] Procedure:                Upper GI endoscopy Indications:              Abdominal pain, predominantly post-prandial Medicines:                Monitored Anesthesia Care Procedure:                Pre-Anesthesia Assessment:                           - Prior to the procedure, a History and Physical                            was performed, and patient medications and                            allergies were reviewed. The patient's tolerance of                            previous anesthesia was also reviewed. The risks                            and benefits of the procedure and the sedation                            options and risks were discussed with the patient.                            All questions were answered, and informed consent                            was obtained. Prior Anticoagulants: The patient has                            taken no previous anticoagulant or antiplatelet                            agents. ASA Grade Assessment: III - A patient with                            severe systemic disease. After reviewing the risks                            and benefits, the patient was deemed in                            satisfactory condition to undergo the procedure.                           After obtaining informed consent, the endoscope was  passed under direct vision. Throughout the                            procedure, the patient's blood pressure, pulse, and                            oxygen saturations were monitored continuously. The                            Endoscope was introduced through the mouth, and                            advanced to the third part of duodenum. The upper                            GI  endoscopy was accomplished without difficulty.                            The patient tolerated the procedure well. Scope In: Scope Out: Findings:                 The esophagus was normal. The z-line is located 35                            cm from the incisors.                           A single small nodular erosion with no bleeding and                            no stigmata of recent bleeding was found in the                            gastric body. There is mild erythema consistent                            with gastritis in the gastric body. Biopsies were                            taken from the erosion, antrum, body, and fundus                            with a cold forceps for histology. Estimated blood                            loss was minimal.                           Diffuse mildly erythematous mucosa without active                            bleeding and with no stigmata of bleeding was found  in the duodenal bulb. Biopsies were taken with a                            cold forceps for histology. Estimated blood loss                            was minimal.                           The cardia and gastric fundus were normal on                            retroflexion.                           The exam was otherwise without abnormality. Complications:            No immediate complications. Estimated blood loss:                            Minimal. Estimated Blood Loss:     Estimated blood loss was minimal. Impression:               - Normal esophagus.                           - Erosive gastropathy with no bleeding and no                            stigmata of recent bleeding. Biopsied.                           - Erythematous duodenopathy. Biopsied.                           - The examination was otherwise normal. Recommendation:           - Patient has a contact number available for                            emergencies. The signs and symptoms of  potential                            delayed complications were discussed with the                            patient. Return to normal activities tomorrow.                            Written discharge instructions were provided to the                            patient.                           - Resume previous diet.                           -  Continue present medications including omeprazole                            40 mg daily.                           - Avoid all NSAIDs.                           - Await pathology results.                           - Follow-up with surgery as previously planned to                            consider adhesive disease contributing to her                            abdominal pain Tressia Danas MD, MD 09/09/2021 11:17:25 AM This report has been signed electronically.

## 2021-09-09 NOTE — Progress Notes (Signed)
Called to room to assist during endoscopic procedure.  Patient ID and intended procedure confirmed with present staff. Received instructions for my participation in the procedure from the performing physician.  

## 2021-09-09 NOTE — Patient Instructions (Signed)
Take your medication as ordered.  Avoid NSAIDS: aspirin, aleve and ibuprofen.  YOU HAD AN ENDOSCOPIC PROCEDURE TODAY AT THE [facility]:   Refer to the procedure report that was given to you for any specific questions about what was found during the examination.  If the procedure report does not answer your questions, please call your gastroenterologist to clarify.  If you requested that your care partner not be given the details of your procedure findings, then the procedure report has been included in a sealed envelope for you to review at your convenience later.  YOU SHOULD EXPECT: Some feelings of bloating in the abdomen. Passage of more gas than usual.  Walking can help get rid of the air that was put into your GI tract during the procedure and reduce the bloating. If you had a lower endoscopy (such as a colonoscopy or flexible sigmoidoscopy) you may notice spotting of blood in your stool or on the toilet paper. If you underwent a bowel prep for your procedure, you may not have a normal bowel movement for a few days.  Please Note:  You might notice some irritation and congestion in your nose or some drainage.  This is from the oxygen used during your procedure.  There is no need for concern and it should clear up in a day or so.  SYMPTOMS TO REPORT IMMEDIATELY:  Following upper endoscopy (EGD)  Vomiting of blood or coffee ground material  New chest pain or pain under the shoulder blades  Painful or persistently difficult swallowing  New shortness of breath  Fever of 100F or higher  Black, tarry-looking stools  For urgent or emergent issues, a gastroenterologist can be reached at any hour by calling [phone]. Do not use MyChart messaging for urgent concerns.    DIET:  We do recommend a small meal at first, but then you may proceed to your regular diet.  Drink plenty of fluids but you should avoid alcoholic beverages for 24 hours.  ACTIVITY:  You should plan to take it  easy for the rest of today and you should NOT DRIVE or use heavy machinery until tomorrow (because of the sedation medicines used during the test).    FOLLOW UP: Our staff will call the number listed on your records 48-72 hours following your procedure to check on you and address any questions or concerns that you may have regarding the information given to you following your procedure. If we do not reach you, we will leave a message.  We will attempt to reach you two times.  During this call, we will ask if you have developed any symptoms of COVID 19. If you develop any symptoms (ie: fever, flu-like symptoms, shortness of breath, cough etc.) before then, please call [phone].  If you test positive for Covid 19 in the 2 weeks post procedure, please call and report this information to [place].    If any biopsies were taken you will be contacted by phone or by letter within the next 1-3 weeks.  Please call us at [phone] if you have not heard about the biopsies in 3 weeks.    SIGNATURES/CONFIDENTIALITY: You and/or your care partner have signed paperwork which will be entered into your electronic medical record.  These signatures attest to the fact that that the information above on your After Visit Summary has been reviewed and is understood.  Full responsibility of the confidentiality of this discharge information lies with you and/or your care-partner.

## 2021-09-09 NOTE — Progress Notes (Addendum)
Referring Provider: Fayette Pho, MD Primary Care Physician:  Fayette Pho, MD  Reason for Procedure:  Abdominal pain   IMPRESSION:  Post-prandial abdominal pain Appropriate candidate for monitored anesthesia care  PLAN: EGD in the [facility] today   HPI: [name] [name] is a 60 y.o. female presents for endoscopy to evaluate post-prandial abdominal pain. Please see my office note from 08/28/21 for full details. No change in history or physical exam since that time.     Past Medical History:  Diagnosis Date   Anemia 07/27/2016   Atypical chest pain 08/07/2020   Clotting disorder (HCC)    Colostomy stricture (HCC) 07/02/2015   Constipation    Cough 08/07/2020   Depression    Diarrhea 11/13/2020   Acute watery diarrhea starting 12/14 associated with abdominal pain, headache, feeling "hot and cold".    Difficulty sleeping    Headache 07/18/2018   Hypokalemia 06/27/2020   Literacy level of illiterate 10/01/2016   Less than one year of formal education in [place].  Ms Fiebig is taking classes for reading and writing in [place].    S/P colostomy [facility]) 03/02/2015   March 2016: colon perforation following screening colonoscopy    S/P colostomy [facility]) 03/02/2015   March 2016: colon perforation following screening colonoscopy    SBO (small bowel obstruction) (HCC) 03/18/2021    Past Surgical History:  Procedure Laterality Date   ABDOMINAL HYSTERECTOMY  1994   APPENDECTOMY  1993   CESAREAN SECTION  1992   COLONOSCOPY N/A 05/30/2015   Procedure: COLONOSCOPY;  Surgeon: Romie Levee, MD;  Location: [facility];  Service: Endoscopy;  Laterality: N/A;   COLONOSCOPY WITH PROPOFOL N/A 06/12/2015   Procedure: COLONOSCOPY WITH PROPOFOL;  Surgeon: Romie Levee, MD;  Location: [facility];  Service: Endoscopy;  Laterality: N/A;   COLOSTOMY  01/2015   COLOSTOMY TAKEDOWN N/A 07/02/2015   Procedure: LAPAROSCOPIC COLOSTOMY REVERSAL SPLENIC FLEXURE MOBILIZATION AND PARTIAL COLECTOMY;  Surgeon: Romie Levee, MD;  Location: [facility];  Service: General;  Laterality: N/A;   DILATION AND CURETTAGE OF UTERUS  1990   "miscarriage"   LAPAROTOMY N/A 02/20/2015   Procedure: PARTIAL COLECTOMY END  ILEOSTOMY AND HARTMAN'S PROCEDURE;  Surgeon: Romie Levee, MD;  Location: [facility];  Service: General;  Laterality: N/A;    Current Outpatient Medications  Medication Sig Dispense Refill   acetaminophen (TYLENOL) 500 MG tablet Take 1,000 mg by mouth every 6 (six) hours as needed for headache or moderate pain.     omeprazole (PRILOSEC) 20 MG capsule Take 1 capsule (20 mg total) by mouth daily. 30 capsule 0   polyethylene glycol powder (GLYCOLAX/MIRALAX) 17 GM/SCOOP powder Take 17 g by mouth in the morning and at bedtime. For one week to relieve abdominal pain. Afterwards use once daily for maintenance of bowel movements. 3350 g 1   sucralfate (CARAFATE) 1 GM/10ML suspension Take 10 mLs (1 g total) by mouth 4 (four) times daily -  with meals and at bedtime. (Patient not taking: Reported on 09/09/2021) 420 mL 0   Current Facility-Administered Medications  Medication Dose Route Frequency Provider Last Rate Last Admin   0.9 %  sodium chloride infusion  500 mL Intravenous Once Tressia Danas, MD        Allergies as of 09/09/2021   (No Known Allergies)    Family History  Problem Relation Age of Onset   Colon cancer Neg Hx    Rectal cancer Neg Hx    Stomach cancer Neg Hx  Physical Exam: General:   Alert,  well-nourished, pleasant and cooperative in NAD Head:  Normocephalic and atraumatic. Eyes:  Sclera clear, no icterus.   Conjunctiva pink. Mouth:  No deformity or lesions.   Neck:  Supple; no masses or thyromegaly. Lungs:  Clear throughout to auscultation.   No wheezes. Heart:  Regular rate and rhythm; no murmurs. Abdomen:  Soft, non-tender, nondistended, normal bowel sounds, no rebound or guarding.  Msk:  Symmetrical. No boney deformities LAD: No inguinal or umbilical LAD Extremities:  No  clubbing or edema. Neurologic:  Alert and  oriented x4;  grossly nonfocal Skin:  No obvious rash or bruise. Psych:  Alert and cooperative. Normal mood and affect.      Hayden Mabin L. Orvan Falconer, MD, MPH 09/09/2021, 10:30 AM

## 2021-09-09 NOTE — Progress Notes (Signed)
Pt in recovery with monitors in place, VSS. Report given to receiving RN. Bite guard was placed with pt awake to ensure comfort. No dental or soft tissue damage noted. RN will remove the guard when the pt is awake.  

## 2021-09-09 NOTE — Progress Notes (Signed)
Pt's states no medical or surgical changes since previsit or office visit. VS assessed by C.W 

## 2021-09-09 NOTE — Progress Notes (Signed)
The translator left the building.  The son-in-law is translating.

## 2021-09-11 ENCOUNTER — Other Ambulatory Visit: Payer: Self-pay

## 2021-09-11 MED ORDER — SUCRALFATE 1 GM/10ML PO SUSP: 1.0000 g | Freq: Three times a day (TID) | ORAL | 0 refills | Status: DC

## 2021-09-13 ENCOUNTER — Encounter: Payer: Self-pay | Admitting: Gastroenterology

## 2021-09-14 ENCOUNTER — Encounter: Payer: Self-pay | Admitting: Family Medicine

## 2021-09-14 DIAGNOSIS — K2950 Unspecified chronic gastritis without bleeding: Secondary | ICD-10-CM | POA: Insufficient documentation

## 2021-09-14 DIAGNOSIS — K2980 Duodenitis without bleeding: Secondary | ICD-10-CM | POA: Insufficient documentation

## 2021-09-17 ENCOUNTER — Telehealth: Payer: Self-pay | Admitting: Gastroenterology

## 2021-09-17 NOTE — Telephone Encounter (Signed)
Patients daughter Victorino Dike called for path results.

## 2021-09-17 NOTE — Telephone Encounter (Signed)
Returned pt call. Pt has been advised that the provider has not yet reviewed results nor provided recommendations based upon the results. Pt reassured a call will be placed providing her with the provider's response to the results, as well as the provider's recommendations once they have been reviewed. Verbalized acceptance and understanding.

## 2021-09-18 ENCOUNTER — Ambulatory Visit: Payer: 59 | Admitting: Gastroenterology

## 2021-09-22 ENCOUNTER — Ambulatory Visit: Payer: 59 | Admitting: Gastroenterology

## 2021-09-30 ENCOUNTER — Other Ambulatory Visit: Payer: Self-pay

## 2021-09-30 ENCOUNTER — Ambulatory Visit: Payer: 59 | Admitting: Family Medicine

## 2021-09-30 ENCOUNTER — Encounter: Payer: Self-pay | Admitting: Family Medicine

## 2021-09-30 VITALS — BP 143/98 | HR 75 | Ht 59.0 in | Wt <= 1120 oz

## 2021-09-30 DIAGNOSIS — K2950 Unspecified chronic gastritis without bleeding: Secondary | ICD-10-CM

## 2021-09-30 DIAGNOSIS — R030 Elevated blood-pressure reading, without diagnosis of hypertension: Secondary | ICD-10-CM

## 2021-09-30 MED ORDER — BLOOD PRESSURE MONITOR MISC
0 refills | Status: DC
  Filled 2021-10-22: qty 1, 1d supply, fill #0

## 2021-09-30 MED ORDER — SUCRALFATE 1 GM/10ML PO SUSP: 1.0000 g | Freq: Three times a day (TID) | ORAL | 1 refills | Status: DC

## 2021-09-30 MED ORDER — SUCRALFATE 1 GM/10ML PO SUSP
1.0000 g | Freq: Three times a day (TID) | ORAL | 1 refills | Status: DC
  Filled 2021-10-22: qty 420, 11d supply, fill #0

## 2021-09-30 MED ORDER — OMEPRAZOLE 20 MG PO CPDR
20.0000 mg | DELAYED_RELEASE_CAPSULE | Freq: Every day | ORAL | 1 refills | Status: DC
  Filled 2021-10-22: qty 30, 30d supply, fill #0

## 2021-09-30 NOTE — Progress Notes (Signed)
    SUBJECTIVE:   CHIEF COMPLAINT / HPI: med refill  Due to language barrier, an interpreter was present during the history-taking and subsequent discussion (and for part of the physical exam) with this patient.   Requesting medications refill for Sulcrafate and omeprazole Recently evaluated by GI and waiting EGD results.   Requesting longterm disability.  Patient will discuss this with primary PCP.  PERTINENT  PMH / PSH: Chronic gastritis  OBJECTIVE:   BP (!) 143/98   Pulse 75   Ht 4\' 11"  (1.499 m)   Wt 69 lb 12.8 oz (31.7 kg)   SpO2 100%   BMI 14.10 kg/m    General: Alert, no acute distress Cardio: Normal S1 and S2, RRR, no r/m/g Pulm: CTAB, normal work of breathing Abdomen: Bowel sounds normal. Abdomen soft and non-tender.    ASSESSMENT/PLAN:   Chronic gastritis Per GI note dose was 40 mg BID but patient reports taking 20 mg BID -Will refill Omeprazole 20 mg daily. Patient to follow up with GI fir increase in dose -Refill Sulcrafate 1000 mg QID -Follow up with PCP for long term disability discussion   Elevated BP without diagnosis of hypertension BP elevate today.  Not taking mediations -BP monitor ordered and patient to chek BP 2-3 times a weeks -Follow up with PCP in 2-3 weeks and bring in readings at that time -Strict return precautions provided     , MD [facility]

## 2021-09-30 NOTE — Patient Instructions (Addendum)
Thank you for coming to see me today. It was a pleasure.   Blood pressure is elevated today.  Please check your blood pressure at home 2-3 times a week for the next couple of weeks and write down the readings.  Then bring this to your PCP to review  Follow up with GI doctor for results of recent endoscopy Refills have been sent for Sulcrafate and Omeprazole  Please follow-up with PCP 2-3 weeks  If you have any questions or concerns, please do not hesitate to call the office at [phone].  Best,   Dana Allan, MD

## 2021-10-05 ENCOUNTER — Encounter: Payer: Self-pay | Admitting: Family Medicine

## 2021-10-05 DIAGNOSIS — I10 Essential (primary) hypertension: Secondary | ICD-10-CM | POA: Insufficient documentation

## 2021-10-05 DIAGNOSIS — R030 Elevated blood-pressure reading, without diagnosis of hypertension: Secondary | ICD-10-CM | POA: Insufficient documentation

## 2021-10-05 NOTE — Assessment & Plan Note (Signed)
>>  ASSESSMENT AND PLAN FOR ELEVATED BP WITHOUT DIAGNOSIS OF HYPERTENSION WRITTEN ON 10/05/2021  4:54 PM BY Dana Allan, MD  BP elevate today.  Not taking mediations -BP monitor ordered and patient to chek BP 2-3 times a weeks -Follow up with PCP in 2-3 weeks and bring in readings at that time -Strict return precautions provided

## 2021-10-05 NOTE — Assessment & Plan Note (Signed)
>>  ASSESSMENT AND PLAN FOR CHRONIC GASTRITIS WRITTEN ON 10/05/2021  4:52 PM BY Dana Allan, MD  Per GI note dose was 40 mg BID but patient reports taking 20 mg BID -Will refill Omeprazole 20 mg daily. Patient to follow up with GI fir increase in dose -Refill Sulcrafate 1000 mg QID -Follow up with PCP for long term disability discussion

## 2021-10-05 NOTE — Assessment & Plan Note (Signed)
Per GI note dose was 40 mg BID but patient reports taking 20 mg BID -Will refill Omeprazole 20 mg daily. Patient to follow up with GI fir increase in dose -Refill Sulcrafate 1000 mg QID -Follow up with PCP for long term disability discussion

## 2021-10-05 NOTE — Assessment & Plan Note (Signed)
BP elevate today.  Not taking mediations -BP monitor ordered and patient to chek BP 2-3 times a weeks -Follow up with PCP in 2-3 weeks and bring in readings at that time -Strict return precautions provided

## 2021-10-19 ENCOUNTER — Other Ambulatory Visit: Payer: Self-pay

## 2021-10-19 ENCOUNTER — Ambulatory Visit: Payer: 59 | Admitting: Family Medicine

## 2021-10-19 DIAGNOSIS — Z0289 Encounter for other administrative examinations: Secondary | ICD-10-CM

## 2021-10-19 NOTE — Patient Instructions (Addendum)
Thank you for coming to see me today. It was a pleasure.   Re open a claim by calling [employer] at [phone].  Ask then to send the correct forms to  your PCP at [phone]  You will need to create an account with [employer].  Go to [email or web address] to do this or call [phone].  Ask them to fax the FMLA forms to your PCP at [phone]  Your PCP, Dr Larita Fife, can complete your disability forms.    Please follow-up with PCP as needed  If you have any questions or concerns, please do not hesitate to call the office at [phone].  Best,   Dana Allan, MD

## 2021-10-19 NOTE — Progress Notes (Signed)
    SUBJECTIVE:   CHIEF COMPLAINT / HPI: FMLA forms to complete  Due to language barrier, an interpreter was present during the history-taking and subsequent discussion (and for part of the physical exam) with this patient.    Patient presents to clinic today with son in law and interpreter requesting to complete FMLA forms.  No FMLA forms with them and we do not provide forms at clinic.     PERTINENT  PMH / PSH:  GERD  OBJECTIVE:   There were no vitals taken for this visit.   BP 152/87 HR 82, O2 100%  General: Alert, no acute distress Cardio: Normal S1 and S2, RRR, no r/m/g Pulm: CTAB, normal work of breathing Abdomen: Bowel sounds normal. Abdomen soft and non-tender.    ASSESSMENT/PLAN:   Encounter for completion of form with patient Unable to complete FMLA forms as patient does not have proper forms to fill out.  She needs to create an account with [employer] or call them to have forms sent to her PCP.  Also there was a claim that was closed due to not having forms sent in on time.  Unknown if form had been received, this provider did not receive any forms on complete on behalf of patient.  Explained to patient and son in law steps to take and given directions to start process.  Have all forms faxed to PCP from appropriate places.  Have also reached out to financial assistance for help with payment of medication Sulcrafate as this is not covered under insurance  Follow up with PCP     Dana Allan, MD [facility]

## 2021-10-22 ENCOUNTER — Other Ambulatory Visit (HOSPITAL_COMMUNITY): Payer: Self-pay

## 2021-10-23 ENCOUNTER — Encounter: Payer: Self-pay | Admitting: Family Medicine

## 2021-10-23 DIAGNOSIS — Z0289 Encounter for other administrative examinations: Secondary | ICD-10-CM

## 2021-10-23 HISTORY — DX: Encounter for other administrative examinations: Z02.89

## 2021-10-23 NOTE — Assessment & Plan Note (Signed)
Unable to complete FMLA forms as patient does not have proper forms to fill out.  She needs to create an account with [employer] or call them to have forms sent to her PCP.  Also there was a claim that was closed due to not having forms sent in on time.  Unknown if form had been received, this provider did not receive any forms on complete on behalf of patient.  Explained to patient and son in law steps to take and given directions to start process.  Have all forms faxed to PCP from appropriate places.  Have also reached out to financial assistance for help with payment of medication Sulcrafate as this is not covered under insurance  Follow up with PCP

## 2021-10-29 ENCOUNTER — Ambulatory Visit: Payer: 59 | Admitting: Family Medicine

## 2021-11-11 ENCOUNTER — Other Ambulatory Visit: Payer: Self-pay | Admitting: Family Medicine

## 2021-11-11 ENCOUNTER — Ambulatory Visit: Payer: 59 | Admitting: Family Medicine

## 2021-11-11 ENCOUNTER — Encounter: Payer: Self-pay | Admitting: Family Medicine

## 2021-11-11 ENCOUNTER — Other Ambulatory Visit: Payer: Self-pay

## 2021-11-11 VITALS — BP 122/78 | HR 68 | Ht 59.0 in | Wt 72.2 lb

## 2021-11-11 DIAGNOSIS — Z23 Encounter for immunization: Secondary | ICD-10-CM | POA: Diagnosis not present

## 2021-11-11 DIAGNOSIS — R030 Elevated blood-pressure reading, without diagnosis of hypertension: Secondary | ICD-10-CM | POA: Diagnosis not present

## 2021-11-11 DIAGNOSIS — R109 Unspecified abdominal pain: Secondary | ICD-10-CM

## 2021-11-11 NOTE — Patient Instructions (Addendum)
It was wonderful to see you today. Thank you for allowing me to be a part of your care. Below is a short summary of what we discussed at your visit today:  Flu vaccine You received the influenza vaccine this year on 08/06/21. We provided you documentation of the records from vaccination. You should keep a copy for yourself, but also take copies to your employer, [employer], and [employer].   FMLA for intermittent leave I will fill out your FMLA forms to indicate that occasional leave from work for this abdominal pain is medically necessary. I will fax this directly to your employer and then mail you a hard copy once it is complete.    Abdominal pain Call the surgeon to make an appointment for follow up. You saw Dr. Romie Levee at [facility].  [phone]    Please bring all of your medications to every appointment!  If you have any questions or concerns, please do not hesitate to contact us via phone or MyChart message.   Fayette Pho, MD

## 2021-11-11 NOTE — Progress Notes (Signed)
° ° °  SUBJECTIVE:   CHIEF COMPLAINT / HPI:   Influenza documentation - patient was terminated from employment for not obtaining influenza vaccination before deadline - our records indicate she received this on 08/06/21 - will print our record of vaccination and NCBI for her to take to employer  FMLA for intermittent leave - requests FMLA form for intermittent leave related to abdominal pain - abdominal pain continues to be worked up, last seen by GI, next will be seen by general surgery  PERTINENT  PMH / PSH:  Patient Active Problem List   Diagnosis Date Noted   Encounter for completion of form with patient 10/23/2021   Elevated BP without diagnosis of hypertension 10/05/2021   Peptic duodenitis 09/14/2021   Chronic gastritis 09/14/2021   Influenza vaccine administered 08/06/2021   Language barrier affecting health care 08/07/2020   Annual physical exam 06/27/2020   Literacy level of illiterate 10/01/2016   Reduced visual acuity 09/30/2016   Abdominal pain 01/15/2016    OBJECTIVE:   BP 122/78    Pulse 68    Ht 4\' 11"  (1.499 m)    Wt 72 lb 3.2 oz (32.7 kg)    SpO2 99%    BMI 14.58 kg/m   PHQ-9:  Depression screen [facility] 2/9 11/11/2021 08/20/2021 08/06/2021  Decreased Interest 2 1 0  Down, Depressed, Hopeless 1 0 0  PHQ - 2 Score 3 1 0  Altered sleeping 2 0 0  Tired, decreased energy 1 3 0  Change in appetite 1 3 0  Feeling bad or failure about yourself  2 1 0  Trouble concentrating 1 1 0  Moving slowly or fidgety/restless 2 3 0  Suicidal thoughts 0 0 0  PHQ-9 Score 12 12 0  Difficult doing work/chores - Not difficult at all Not difficult at all    GAD-7: No flowsheet data found.   Physical Exam General: Awake, alert, oriented, no acute distress Respiratory: Unlabored respirations, speaking in full sentences, no respiratory distress Extremities: Moving all extremities spontaneously  ASSESSMENT/PLAN:   Influenza vaccine administered Patient requests documentation of  influenza vaccination, given 08/06/21. Provided copy of administration and also copy of NCBI.   Abdominal pain Stable. Patient continues to take stool softeners and acid reducers as prescribed. Will complete FMLA paperwork for intermittent leave PRN while abdominal pain work up continues.   Elevated BP without diagnosis of hypertension BP at goal today. No treatment indicated.      08/08/21, MD [facility]

## 2021-11-11 NOTE — Assessment & Plan Note (Signed)
Patient requests documentation of influenza vaccination, given 08/06/21. Provided copy of administration and also copy of NCBI.

## 2021-11-11 NOTE — Progress Notes (Signed)
Error. Patient showed up right at 15 minutes.  Fayette Pho, MD

## 2021-11-11 NOTE — Assessment & Plan Note (Addendum)
Stable. Patient continues to take stool softeners and acid reducers as prescribed. Will complete FMLA paperwork for intermittent leave PRN while abdominal pain work up continues.

## 2021-11-11 NOTE — Progress Notes (Signed)
No show letter.  °Sofija Antwi, MD ° °

## 2021-11-11 NOTE — Assessment & Plan Note (Signed)
BP at goal today. No treatment indicated.

## 2021-11-11 NOTE — Assessment & Plan Note (Signed)
>>  ASSESSMENT AND PLAN FOR ELEVATED BP WITHOUT DIAGNOSIS OF HYPERTENSION WRITTEN ON 11/11/2021  6:46 PM BY LYNN, CATHERINE, MD  BP at goal today. No treatment indicated.

## 2021-11-25 ENCOUNTER — Ambulatory Visit: Payer: 59 | Admitting: Gastroenterology

## 2021-12-24 ENCOUNTER — Encounter: Payer: Self-pay | Admitting: Family Medicine

## 2021-12-24 NOTE — Patient Instructions (Addendum)
It was wonderful to see you today. Thank you for allowing me to be a part of your care. Below is a short summary of what we discussed at your visit today:  Rib pain I believe this pain is musculoskeletal.  He probably strained your pectoralis major or minor while picking up that heavy item.   Take meloxicam (the anti-inflammatory medicine we talked about) once a day for a total of 2 weeks.  Apply heat or ice as necessary for pain relief.  Vaccines Today you received the bivalent COVID booster. You may experience some residual soreness at the injection site.  Gentle stretches and regular use of that arm will help speed up your recovery.  As the vaccines are giving your immune system a "practice run" against specific infections, you may feel a little under the weather for the next several days.  We recommend rest as needed and hydrating.    Please bring all of your medications to every appointment!  If you have any questions or concerns, please do not hesitate to contact us via phone or MyChart message.   Ezequiel Essex, MD

## 2021-12-24 NOTE — Progress Notes (Signed)
° ° °  SUBJECTIVE:   CHIEF COMPLAINT / HPI:   Health Maintenance - due for COVID booster, amenable to receiving today - colonoscopy UTD - mammogram ordered 08/06/21, reminded patient and she reports she would like to decline  Right sided chest pain - right sided, describes pain as dull - happened last week - was pulling up something heavy at home - constant pain but worse when coughing, it radiates around to her back near scapula - at rest, pain is right chest at mid-clavicular line, approximately 4th rib - no left sided chest pain, jaw radiation, diaphoresis, SOB, syncope, dizziness - still able to use right arm, using arm does not aggravate pain  FMLA documentation - provides letter from [employer] stating they would like some documentation for her claim - will forward to medical records to use proper channels - patient also requests a form of work excuse from [place] today listing the dates since September she has been here for clinic appointments  PERTINENT  PMH / PSH:  Patient Active Problem List   Diagnosis Date Noted   Chest wall pain 12/25/2021   Elevated BP without diagnosis of hypertension 10/05/2021   Peptic duodenitis 09/14/2021   Chronic gastritis 09/14/2021   Language barrier affecting health care 08/07/2020   Literacy level of illiterate 10/01/2016   Reduced visual acuity 09/30/2016   Healthcare maintenance 07/27/2016   Abdominal pain 01/15/2016     OBJECTIVE:   BP (!) 134/92    Pulse 75    Ht 4\' 11"  (1.499 m)    Wt 71 lb 12.8 oz (32.6 kg)    SpO2 100%    BMI 14.50 kg/m    PHQ-9:  Depression screen [facility] 2/9 11/11/2021 08/20/2021 08/06/2021  Decreased Interest 2 1 0  Down, Depressed, Hopeless 1 0 0  PHQ - 2 Score 3 1 0  Altered sleeping 2 0 0  Tired, decreased energy 1 3 0  Change in appetite 1 3 0  Feeling bad or failure about yourself  2 1 0  Trouble concentrating 1 1 0  Moving slowly or fidgety/restless 2 3 0  Suicidal thoughts 0 0 0   PHQ-9 Score 12 12 0  Difficult doing work/chores - Not difficult at all Not difficult at all     GAD-7: No flowsheet data found.   Physical Exam General: Awake, alert, oriented Cardiovascular: Regular rate and rhythm, S1 and S2 present, no murmurs auscultated Respiratory: Lung fields clear to auscultation bilaterally MSK: Point TTP right rib cage over fourth-fifth rib at midclavicular line, BUE strength 5/5 equal bilaterally  ASSESSMENT/PLAN:   Healthcare maintenance Received a booster today, tolerated well.  No adverse side effects.  Colonoscopy up-to-date.  Mammogram ordered last September, reminded patient of this but she reported that she would like to decline.  Chest wall pain Acute, 2-week duration.  Appears musculoskeletal in nature.  No red flag symptoms today.  Rx 2-week trial meloxicam, recommend heat and ice.  Return precautions covered, see AVS for more  Encounter for completion of form with patient Patient presents paperwork from the [employer] about her claim [id number].  This paperwork essentially request records, I discussed with her about sending this to medical records for official records release.    Ezequiel Essex, MD [place]

## 2021-12-25 ENCOUNTER — Other Ambulatory Visit: Payer: Self-pay

## 2021-12-25 ENCOUNTER — Encounter: Payer: Self-pay | Admitting: Family Medicine

## 2021-12-25 ENCOUNTER — Ambulatory Visit (INDEPENDENT_AMBULATORY_CARE_PROVIDER_SITE_OTHER): Payer: Self-pay

## 2021-12-25 ENCOUNTER — Ambulatory Visit (INDEPENDENT_AMBULATORY_CARE_PROVIDER_SITE_OTHER): Payer: Self-pay | Admitting: Family Medicine

## 2021-12-25 VITALS — BP 134/92 | HR 75 | Ht 59.0 in | Wt 71.8 lb

## 2021-12-25 DIAGNOSIS — Z0289 Encounter for other administrative examinations: Secondary | ICD-10-CM

## 2021-12-25 DIAGNOSIS — E8809 Other disorders of plasma-protein metabolism, not elsewhere classified: Secondary | ICD-10-CM

## 2021-12-25 DIAGNOSIS — R1084 Generalized abdominal pain: Secondary | ICD-10-CM

## 2021-12-25 DIAGNOSIS — R0781 Pleurodynia: Secondary | ICD-10-CM

## 2021-12-25 DIAGNOSIS — Z23 Encounter for immunization: Secondary | ICD-10-CM

## 2021-12-25 DIAGNOSIS — R718 Other abnormality of red blood cells: Secondary | ICD-10-CM

## 2021-12-25 DIAGNOSIS — D649 Anemia, unspecified: Secondary | ICD-10-CM

## 2021-12-25 DIAGNOSIS — E876 Hypokalemia: Secondary | ICD-10-CM

## 2021-12-25 DIAGNOSIS — R0789 Other chest pain: Secondary | ICD-10-CM | POA: Insufficient documentation

## 2021-12-25 DIAGNOSIS — Z0000 Encounter for general adult medical examination without abnormal findings: Secondary | ICD-10-CM

## 2021-12-25 DIAGNOSIS — H547 Unspecified visual loss: Secondary | ICD-10-CM

## 2021-12-25 MED ORDER — MELOXICAM 7.5 MG PO TABS: 7.5000 mg | ORAL_TABLET | Freq: Every day | ORAL | 0 refills | Status: AC

## 2021-12-25 NOTE — Assessment & Plan Note (Signed)
Patient presents paperwork from the [facility] company about her claim #[id number].  This paperwork essentially request records, I discussed with her about sending this to medical records for official records release.

## 2021-12-25 NOTE — Assessment & Plan Note (Signed)
Acute, 2-week duration.  Appears musculoskeletal in nature.  No red flag symptoms today.  Rx 2-week trial meloxicam, recommend heat and ice.  Return precautions covered, see AVS for more

## 2021-12-25 NOTE — Assessment & Plan Note (Signed)
Received a booster today, tolerated well.  No adverse side effects.  Colonoscopy up-to-date.  Mammogram ordered last September, reminded patient of this but she reported that she would like to decline.

## 2022-01-29 ENCOUNTER — Ambulatory Visit (INDEPENDENT_AMBULATORY_CARE_PROVIDER_SITE_OTHER): Payer: Self-pay | Admitting: Nurse Practitioner

## 2022-01-29 ENCOUNTER — Other Ambulatory Visit: Payer: Self-pay

## 2022-01-29 ENCOUNTER — Encounter: Payer: Self-pay | Admitting: Nurse Practitioner

## 2022-01-29 VITALS — BP 128/87 | HR 70 | Temp 98.0°F | Ht <= 58 in | Wt 70.2 lb

## 2022-01-29 DIAGNOSIS — Z0000 Encounter for general adult medical examination without abnormal findings: Secondary | ICD-10-CM

## 2022-01-29 DIAGNOSIS — R636 Underweight: Secondary | ICD-10-CM

## 2022-01-29 DIAGNOSIS — R1012 Left upper quadrant pain: Secondary | ICD-10-CM

## 2022-01-29 LAB — POCT URINALYSIS DIP (CLINITEK)
Bilirubin, UA: NEGATIVE
Glucose, UA: NEGATIVE mg/dL
Ketones, POC UA: NEGATIVE mg/dL
Nitrite, UA: NEGATIVE
POC PROTEIN,UA: NEGATIVE
Spec Grav, UA: 1.01 (ref 1.010–1.025)
Urobilinogen, UA: 0.2 E.U./dL
pH, UA: 7 (ref 5.0–8.0)

## 2022-01-29 LAB — POCT GLYCOSYLATED HEMOGLOBIN (HGB A1C)
HbA1c POC (<> result, manual entry): 5.2 % (ref 4.0–5.6)
HbA1c, POC (controlled diabetic range): 5.2 % (ref 0.0–7.0)
HbA1c, POC (prediabetic range): 5.2 % — AB (ref 5.7–6.4)
Hemoglobin A1C: 5.2 % (ref 4.0–5.6)

## 2022-01-29 MED ORDER — PANTOPRAZOLE SODIUM 40 MG PO TBEC: 40.0000 mg | DELAYED_RELEASE_TABLET | Freq: Every day | ORAL | 3 refills | Status: DC

## 2022-01-29 NOTE — Patient Instructions (Signed)
You were seen today in the [facility] to establish care and for abdominal pain. Labs were collected, results will be available via MyChart or, if abnormal, you will be contacted by clinic staff. Y Please follow up in 3 mths for reevaluation of weight  ?

## 2022-01-29 NOTE — Progress Notes (Signed)
[address] [place], [place]  [address] Phone:  [phone]   Fax:  [phone] Subjective:   Patient ID: [name] [name], female    DOB: Jan 29, 1961, 61 y.o.   MRN: [id number]  Chief Complaint  Patient presents with   Establish Care    Pt is here to establish care. Pt stated she had a ostomy bag on back in 2016 ever since then when she eats it burns   HPI [name] [name] 61 y.o. female  has a past medical history of Anemia (07/27/2016), Annual physical exam (06/27/2020), Atypical chest pain (08/07/2020), Clotting disorder ([facility]), Colostomy stricture ([place]) (07/02/2015), Constipation, Cough (08/07/2020), Depression, Diarrhea (11/13/2020), Difficulty sleeping, Encounter for completion of form with patient (10/23/2021), Headache (07/18/2018), Hypokalemia (06/27/2020), Influenza vaccine administered (08/06/2021), Literacy level of illiterate (10/01/2016), S/P colostomy ([place]) (03/02/2015), S/P colostomy ([place]) (03/02/2015), and SBO (small bowel obstruction) ([place]) (03/18/2021). To the [facility] to establish care and for abdominal pain. Patient last visit with PCP last month, states that she has seen her for several years, but wanted to change practices.   Currently concerned about abdominal pain in the left upper abdomen. States that she had a colostomy in 2016 in the [facility]. Has had abdominal pain since 2016, with completion of abdominal surgery. Pain has not worsened or improved since initiation. Prescribed medications ineffective in managing pain. Endorses burning pain with eating. Currently endorses pain, uncertain of intensity. Pain typically resolves a few hours after eating. Denies any known foods that trigger pain. Denies drinking alcohol. Does not eat much during the day due to pain. Denies any other concerns today. Denies any fever.   Denies any fatigue, chest pain, shortness of breath, HA or dizziness. Denies any blurred vision, numbness or tingling.   Past Medical History:   Diagnosis Date   Anemia 07/27/2016   Annual physical exam 06/27/2020   Atypical chest pain 08/07/2020   Clotting disorder ([place])    Colostomy stricture ([place]) 07/02/2015   Constipation    Cough 08/07/2020   Depression    Diarrhea 11/13/2020   Acute watery diarrhea starting 12/14 associated with abdominal pain, headache, feeling "hot and cold".    Difficulty sleeping    Encounter for completion of form with patient 10/23/2021   Headache 07/18/2018   Hypokalemia 06/27/2020   Influenza vaccine administered 08/06/2021   Literacy level of illiterate 10/01/2016   Less than one year of formal education in [place].  [name] [name] is taking classes for reading and writing in [place].    S/P colostomy [facility]) 03/02/2015   March 2016: colon perforation following screening colonoscopy    S/P colostomy [facility]) 03/02/2015   March 2016: colon perforation following screening colonoscopy    SBO (small bowel obstruction) ([place]) 03/18/2021    Past Surgical History:  Procedure Laterality Date   [place]   COLONOSCOPY N/A 05/30/2015   Procedure: COLONOSCOPY;  Surgeon: Leighton Ruff, MD;  Location: [facility];  Service: Endoscopy;  Laterality: N/A;   COLONOSCOPY WITH PROPOFOL N/A 06/12/2015   Procedure: COLONOSCOPY WITH PROPOFOL;  Surgeon: Leighton Ruff, MD;  Location: [facility];  Service: Endoscopy;  Laterality: N/A;   COLOSTOMY  01/2015   COLOSTOMY TAKEDOWN N/A 07/02/2015   Procedure: LAPAROSCOPIC COLOSTOMY REVERSAL SPLENIC FLEXURE MOBILIZATION AND PARTIAL COLECTOMY;  Surgeon: Leighton Ruff, MD;  Location: [facility];  Service: General;  Laterality: N/A;   [place]   "  miscarriage"   LAPAROTOMY N/A 02/20/2015   Procedure: PARTIAL COLECTOMY END  ILEOSTOMY AND HARTMAN'S PROCEDURE;  Surgeon: Leighton Ruff, MD;  Location: [facility];  Service: General;  Laterality: N/A;    Family History  Problem Relation Age of Onset   Colon  cancer Neg Hx    Rectal cancer Neg Hx    Stomach cancer Neg Hx     Social History   Socioeconomic History   Marital status: Married    Spouse name: Not on file   Number of children: 2   Years of education: 0   Highest education level: Not on file  Occupational History   Occupation: ENVIRONMENTAL SERVICES    Employer: [employer]  Tobacco Use   Smoking status: Every Day    Packs/day: 0.50    Years: 37.00    Pack years: 18.50    Types: Cigarettes   Smokeless tobacco: Never  Vaping Use   Vaping Use: Never used  Substance and Sexual Activity   Alcohol use: No    Alcohol/week: 0.0 standard drinks   Drug use: No   Sexual activity: Not Currently  Other Topics Concern   Not on file  Social History Narrative   Lives with husband, daughter & her daughter's boyfriend & her grandchildren.   Illiterate in any language.   Less than one year of formal education in [place].    Work: [employer]   From: [place] but speaks English okay    Social Determinants of Radio broadcast assistant Strain: Not on [employer] Insecurity: Not on file  Transportation Needs: Not on file  Physical Activity: Not on file  Stress: Not on file  Social Connections: Not on file  Intimate Partner Violence: Not on file    Outpatient Medications Prior to Visit  Medication Sig Dispense Refill   acetaminophen (TYLENOL) 500 MG tablet Take 1,000 mg by mouth every 6 (six) hours as needed for headache or moderate pain.     Blood Pressure Monitor MISC Use to check blood pressure 2 to 3 times per week 1 each 0   polyethylene glycol powder (GLYCOLAX/MIRALAX) 17 GM/SCOOP powder Take 17 g by mouth in the morning and at bedtime. For one week to relieve abdominal pain. Afterwards use once daily for maintenance of bowel movements. 3350 g 1   omeprazole (PRILOSEC) 20 MG capsule Take 1 capsule (20 mg total) by mouth daily. 30 capsule 1   sucralfate (CARAFATE) 1 GM/10ML suspension Take 10 mLs (1 g total) by  mouth 4 (four) times daily -  with meals and at bedtime. Shake liquid. (Patient not taking: Reported on 01/29/2022) 420 mL 1   No facility-administered medications prior to visit.    No Known Allergies  Review of Systems  Constitutional:  Negative for chills, fever and malaise/fatigue.  HENT: Negative.    Eyes: Negative.   Respiratory:  Negative for cough and shortness of breath.   Cardiovascular:  Negative for chest pain, palpitations and leg swelling.  Gastrointestinal:  Positive for abdominal pain. Negative for blood in stool, constipation, diarrhea, heartburn, melena, nausea and vomiting.  Genitourinary: Negative.   Musculoskeletal: Negative.   Skin: Negative.   Neurological: Negative.   Psychiatric/Behavioral:  Negative for depression. The patient is not nervous/anxious.   All other systems reviewed and are negative.     Objective:    Physical Exam Constitutional:      General: She is not in acute distress.    Appearance: Normal appearance.  HENT:  Head: Normocephalic.     Right Ear: Tympanic membrane, ear canal and external ear normal. There is no impacted cerumen.     Left Ear: Tympanic membrane, ear canal and external ear normal. There is no impacted cerumen.     Nose: Nose normal. No congestion or rhinorrhea.     Mouth/Throat:     Mouth: Mucous membranes are moist.     Pharynx: Oropharynx is clear. No oropharyngeal exudate or posterior oropharyngeal erythema.  Eyes:     General: No scleral icterus.       Right eye: No discharge.        Left eye: No discharge.     Extraocular Movements: Extraocular movements intact.     Conjunctiva/sclera: Conjunctivae normal.     Pupils: Pupils are equal, round, and reactive to light.  Neck:     Vascular: No carotid bruit.  Cardiovascular:     Rate and Rhythm: Normal rate and regular rhythm.     Pulses: Normal pulses.     Heart sounds: Normal heart sounds.     Comments: No obvious peripheral edema Pulmonary:     Effort:  Pulmonary effort is normal.     Breath sounds: Normal breath sounds.  Abdominal:     General: Abdomen is flat. Bowel sounds are normal. There is no distension.     Palpations: Abdomen is soft. There is no mass.     Tenderness: There is no abdominal tenderness. There is no right CVA tenderness, left CVA tenderness, guarding or rebound.     Hernia: No hernia is present.  Musculoskeletal:        General: No swelling, tenderness, deformity or signs of injury. Normal range of motion.     Cervical back: Normal range of motion and neck supple. No rigidity or tenderness.     Right lower leg: No edema.     Left lower leg: No edema.  Lymphadenopathy:     Cervical: No cervical adenopathy.  Skin:    General: Skin is warm and dry.     Capillary Refill: Capillary refill takes less than 2 seconds.  Neurological:     General: No focal deficit present.     Mental Status: She is alert and oriented to person, place, and time.  Psychiatric:        Mood and Affect: Mood normal.        Behavior: Behavior normal.        Thought Content: Thought content normal.        Judgment: Judgment normal.    BP 128/87 (BP Location: Left Arm, Patient Position: Sitting, Cuff Size: Small)    Pulse 70    Temp 98 F (36.7 C)    Ht 4' 6"  (1.372 m)    Wt 70 lb 3.2 oz (31.8 kg)    SpO2 100%    BMI 16.93 kg/m  Wt Readings from Last 3 Encounters:  01/29/22 70 lb 3.2 oz (31.8 kg)  12/25/21 71 lb 12.8 oz (32.6 kg)  11/11/21 72 lb 3.2 oz (32.7 kg)    Immunization History  Administered Date(s) Administered   Influenza,inj,Quad PF,6+ Mos 07/27/2016, 07/18/2018, 11/07/2019, 11/13/2020, 08/06/2021   Influenza-Unspecified 07/23/2014, 08/22/2017   PFIZER(Purple Top)SARS-COV-2 Vaccination 06/27/2020, 07/18/2020, 03/13/2021   Pfizer Covid-19 Vaccine Bivalent Booster 41yr & up 12/25/2021   Pneumococcal Polysaccharide-23 11/07/2014   Td 11/22/1996   Tdap 07/27/2016    Diabetic Foot Exam - Simple   No data filed     Lab  Results  Component  Value Date   TSH 0.418 01/01/2014   Lab Results  Component Value Date   WBC 7.6 08/17/2021   HGB 12.5 08/17/2021   HCT 40.3 08/17/2021   MCV 72.7 (L) 08/17/2021   PLT 240 08/17/2021   Lab Results  Component Value Date   NA 140 08/17/2021   K 4.9 08/17/2021   CO2 28 08/17/2021   GLUCOSE 88 08/17/2021   BUN 18 08/17/2021   CREATININE 0.81 08/17/2021   BILITOT 0.6 08/17/2021   ALKPHOS 87 08/17/2021   AST 21 08/17/2021   ALT 15 08/17/2021   PROT 8.3 (H) 08/17/2021   ALBUMIN 5.1 (H) 08/17/2021   CALCIUM 10.5 (H) 08/17/2021   ANIONGAP 10 08/17/2021   EGFR 96 04/22/2021   Lab Results  Component Value Date   CHOL 148 06/06/2019   CHOL 161 06/24/2017   Lab Results  Component Value Date   HDL 50 06/06/2019   HDL 47 06/24/2017   Lab Results  Component Value Date   LDLCALC 74 06/06/2019   LDLCALC 94 06/24/2017   Lab Results  Component Value Date   TRIG 119 06/06/2019   TRIG 100 06/24/2017   Lab Results  Component Value Date   CHOLHDL 3.0 06/06/2019   CHOLHDL 3.4 06/24/2017   Lab Results  Component Value Date   HGBA1C 5.2 01/29/2022   HGBA1C 5.2 01/29/2022   HGBA1C 5.2 (A) 01/29/2022   HGBA1C 5.2 01/29/2022       Assessment & Plan:   Problem List Items Addressed This Visit       Other   Abdominal pain   Relevant Orders   Ambulatory referral to Gastroenterology Discontinued Prilosec and started Protonix Chart review indicates completion of labs in the previous 6 mths with benign results CT abdomen completed in the last 6 mths with overall benign results   Healthcare maintenance - Primary   Relevant Orders   POCT URINALYSIS DIP (CLINITEK) (Completed)   POCT glycosylated hemoglobin (Hb A1C) (Completed): 5.2   Lipid panel Encouraged continued medication compliance    Other Visit Diagnoses     Underweight     Discussed diet at length      Follow up in 3 mths for reevaluation of weight and abdominal pain, sooner as needed      I have discontinued [name] [name]'s omeprazole. I am also having her start on pantoprazole. Additionally, I am having her maintain her acetaminophen, polyethylene glycol powder, Blood Pressure Monitor, and sucralfate.  Meds ordered this encounter  Medications   pantoprazole (PROTONIX) 40 MG tablet    Sig: Take 1 tablet (40 mg total) by mouth daily.    Dispense:  30 tablet    Refill:  3     Teena Dunk, NP

## 2022-01-30 LAB — LIPID PANEL
Chol/HDL Ratio: 3.2 ratio (ref 0.0–4.4)
Cholesterol, Total: 170 mg/dL (ref 100–199)
HDL: 53 mg/dL (ref >39)
LDL Chol Calc (NIH): 97 mg/dL (ref 0–99)
Triglycerides: 110 mg/dL (ref 0–149)
VLDL Cholesterol Cal: 20 mg/dL (ref 5–40)

## 2022-02-05 ENCOUNTER — Ambulatory Visit: Payer: Self-pay | Attending: Family Medicine

## 2022-02-05 ENCOUNTER — Other Ambulatory Visit: Payer: Self-pay

## 2022-03-19 ENCOUNTER — Encounter: Payer: Self-pay | Admitting: Nurse Practitioner

## 2022-03-19 ENCOUNTER — Ambulatory Visit (INDEPENDENT_AMBULATORY_CARE_PROVIDER_SITE_OTHER): Payer: Self-pay | Admitting: Nurse Practitioner

## 2022-03-19 VITALS — BP 141/75 | HR 70 | Temp 98.4°F | Ht <= 58 in | Wt 72.8 lb

## 2022-03-19 DIAGNOSIS — R030 Elevated blood-pressure reading, without diagnosis of hypertension: Secondary | ICD-10-CM

## 2022-03-19 DIAGNOSIS — R413 Other amnesia: Secondary | ICD-10-CM

## 2022-03-19 DIAGNOSIS — G4489 Other headache syndrome: Secondary | ICD-10-CM

## 2022-03-19 DIAGNOSIS — R42 Dizziness and giddiness: Secondary | ICD-10-CM

## 2022-03-19 DIAGNOSIS — Z789 Other specified health status: Secondary | ICD-10-CM

## 2022-03-19 NOTE — Progress Notes (Signed)
? ?[facility] ?[address] ?[place], [place]  [address] ?Phone:  [phone]   Fax:  [phone] ?Subjective:  ? Patient ID: [name] [name], female    DOB: [birth year], 61 y.o.   MRN: [id number] ? ?Chief Complaint  ?Patient presents with  ? Dizziness  ?  Pt stated she has dizziness it been 2 weeks   ? ? ?[name] [name] 61 y.o. female  has a past medical history of Anemia (07/27/2016), Annual physical exam (06/27/2020), Atypical chest pain (08/07/2020), Clotting disorder (HCC), Colostomy stricture (HCC) (07/02/2015), Constipation, Cough (08/07/2020), Depression, Diarrhea (11/13/2020), Difficulty sleeping, Encounter for completion of form with patient (10/23/2021), Headache (07/18/2018), Hypokalemia (06/27/2020), Influenza vaccine administered (08/06/2021), Literacy level of illiterate (10/01/2016), S/P colostomy (HCC) (03/02/2015), S/P colostomy (HCC) (03/02/2015), and SBO (small bowel obstruction) (HCC) (03/18/2021). To the [facility] for dizziness and memory loss. ? ?Patient states that she has had worsening, dizziness with HA and memory loss for the past two weeks. Denies any recent trauma or injury. Denies any HA during today's visit. States that when she is in the middle of doing chores, she feels like she is unable to focus and has to change positions in order to prevent herself from falling.  ? ?Has had worsening short term memory loss and long term memory loss x 4 mths. When questioned about elevated B/P, states that she has not taken prescribed medications today. Daughter at bedside states that she has had memory loss, but not significant. Family endorses no loss of mobility or function, states, "we wanted to have her memory checked today. We are applying for disability." Family indicates that patient has had some HA's, which typically resolves with OTC medication. Denies any other concerns today. ? ?Denies any fatigue, chest pain or shortness of breath. Denies any blurred vision, numbness or  tingling. ? ?Past Medical History:  ?Diagnosis Date  ? Anemia 07/27/2016  ? Annual physical exam 06/27/2020  ? Atypical chest pain 08/07/2020  ? Clotting disorder (HCC)   ? Colostomy stricture (HCC) 07/02/2015  ? Constipation   ? Cough 08/07/2020  ? Depression   ? Diarrhea 11/13/2020  ? Acute watery diarrhea starting 12/14 associated with abdominal pain, headache, feeling "hot and cold".   ? Difficulty sleeping   ? Encounter for completion of form with patient 10/23/2021  ? Headache 07/18/2018  ? Hypokalemia 06/27/2020  ? Influenza vaccine administered 08/06/2021  ? Literacy level of illiterate 10/01/2016  ? Less than one year of formal education in [place].  Ms Avera is taking classes for reading and writing in [place].   ? S/P colostomy (HCC) 03/02/2015  ? March 2016: colon perforation following screening colonoscopy   ? S/P colostomy (HCC) 03/02/2015  ? March 2016: colon perforation following screening colonoscopy   ? SBO (small bowel obstruction) (HCC) 03/18/2021  ? ? ?Past Surgical History:  ?Procedure Laterality Date  ? ABDOMINAL HYSTERECTOMY  1994  ? APPENDECTOMY  1993  ? CESAREAN SECTION  1992  ? COLONOSCOPY N/A 05/30/2015  ? Procedure: COLONOSCOPY;  Surgeon: Romie Levee, MD;  [facility];  Service: Endoscopy;  Laterality: N/A;  ? COLONOSCOPY WITH PROPOFOL N/A 06/12/2015  ? Procedure: COLONOSCOPY WITH PROPOFOL;  Surgeon: Romie Levee, MD;  [facility];  Service: Endoscopy;  Laterality: N/A;  ? COLOSTOMY  01/2015  ? COLOSTOMY TAKEDOWN N/A 07/02/2015  ? Procedure: LAPAROSCOPIC COLOSTOMY REVERSAL SPLENIC FLEXURE MOBILIZATION AND PARTIAL COLECTOMY;  Surgeon: Romie Levee, MD;  Location: [facility];  Service: General;  Laterality: N/A;  ?  DILATION AND CURETTAGE OF UTERUS  1990  ? "miscarriage"  ? LAPAROTOMY N/A 02/20/2015  ? Procedure: PARTIAL COLECTOMY END  ILEOSTOMY AND HARTMAN'S PROCEDURE;  Surgeon: Romie Levee, MD;  [facility]: [facility];  Service: General;  Laterality: N/A;  ? ? ?Family History   ?Problem Relation Age of Onset  ? Colon cancer Neg Hx   ? Rectal cancer Neg Hx   ? Stomach cancer Neg Hx   ? ? ?Social History  ? ?Socioeconomic History  ? Marital status: Married  ?  Spouse name: Not on file  ? Number of children: 2  ? Years of education: 0  ? Highest education level: Not on file  ?Occupational History  ? Occupation: ENVIRONMENTAL SERVICES  ?  Employer: [employer]  ?Tobacco Use  ? Smoking status: Every Day  ?  Packs/day: 0.50  ?  Years: 37.00  ?  Pack years: 18.50  ?  Types: Cigarettes  ? Smokeless tobacco: Never  ?Vaping Use  ? Vaping Use: Never used  ?Substance and Sexual Activity  ? Alcohol use: No  ?  Alcohol/week: 0.0 standard drinks  ? Drug use: No  ? Sexual activity: Not Currently  ?Other Topics Concern  ? Not on file  ?Social History Narrative  ? Lives with husband, daughter & her daughter's boyfriend & her grandchildren.  ? Illiterate in any language.  ? Less than one year of formal education in [place].   ? Work: [employer]  ? From: [place] but speaks English okay   ? ?Social Determinants of Health  ? ?Financial Resource Strain: Not on file  ?Food Insecurity: Not on file  ?Transportation Needs: Not on file  ?Physical Activity: Not on file  ?Stress: Not on file  ?Social Connections: Not on file  ?Intimate Partner Violence: Not on file  ? ? ?Outpatient Medications Prior to Visit  ?Medication Sig Dispense Refill  ? acetaminophen (TYLENOL) 500 MG tablet Take 1,000 mg by mouth every 6 (six) hours as needed for headache or moderate pain.    ? Blood Pressure Monitor MISC Use to check blood pressure 2 to 3 times per week 1 each 0  ? pantoprazole (PROTONIX) 40 MG tablet Take 1 tablet (40 mg total) by mouth daily. 30 tablet 3  ? polyethylene glycol powder (GLYCOLAX/MIRALAX) 17 GM/SCOOP powder Take 17 g by mouth in the morning and at bedtime. For one week to relieve abdominal pain. Afterwards use once daily for maintenance of bowel movements. 3350 g 1  ? sucralfate (CARAFATE) 1  GM/10ML suspension Take 10 mLs (1 g total) by mouth 4 (four) times daily -  with meals and at bedtime. Shake liquid. (Patient not taking: Reported on 01/29/2022) 420 mL 1  ? ?No facility-administered medications prior to visit.  ? ? ?No Known Allergies ? ?Review of Systems  ?Constitutional:  Negative for chills, fever and malaise/fatigue.  ?Respiratory:  Negative for cough and shortness of breath.   ?Cardiovascular:  Negative for chest pain, palpitations and leg swelling.  ?Gastrointestinal:  Negative for abdominal pain, blood in stool, constipation, diarrhea, nausea and vomiting.  ?Musculoskeletal: Negative.   ?Skin: Negative.   ?Neurological:  Positive for dizziness and headaches. Negative for tingling, tremors, sensory change, speech change, focal weakness, seizures, loss of consciousness and weakness.  ?Psychiatric/Behavioral:  Positive for memory loss. Negative for depression, hallucinations, substance abuse and suicidal ideas. The patient is not nervous/anxious and does not have insomnia.   ?All other systems reviewed and are negative. ? ?   ?  Objective:  ?  ?Physical Exam ?Vitals reviewed.  ?Constitutional:   ?   General: She is not in acute distress. ?   Appearance: Normal appearance.  ?HENT:  ?   Head: Normocephalic.  ?   Right Ear: Tympanic membrane, ear canal and external ear normal. There is no impacted cerumen.  ?   Left Ear: Tympanic membrane, ear canal and external ear normal. There is no impacted cerumen.  ?   Nose: Nose normal. No congestion or rhinorrhea.  ?   Mouth/Throat:  ?   Mouth: Mucous membranes are moist.  ?   Pharynx: Oropharynx is clear. No oropharyngeal exudate or posterior oropharyngeal erythema.  ?Cardiovascular:  ?   Rate and Rhythm: Normal rate and regular rhythm.  ?   Pulses: Normal pulses.  ?   Heart sounds: Normal heart sounds.  ?   Comments: No obvious peripheral edema ?Pulmonary:  ?   Effort: Pulmonary effort is normal.  ?   Breath sounds: Normal breath sounds.  ?Skin: ?    General: Skin is warm and dry.  ?   Capillary Refill: Capillary refill takes less than 2 seconds.  ?Neurological:  ?   Mental Status: She is alert.  ?Psychiatric:     ?   Mood and Affect: Mood normal.     ?   Behavior: Behavior norm

## 2022-03-22 ENCOUNTER — Emergency Department (HOSPITAL_BASED_OUTPATIENT_CLINIC_OR_DEPARTMENT_OTHER)
Admission: EM | Admit: 2022-03-22 | Discharge: 2022-03-22 | Discharge status: Against Medical Advice | Payer: Self-pay | Attending: Emergency Medicine | Admitting: Emergency Medicine

## 2022-03-22 ENCOUNTER — Other Ambulatory Visit: Payer: Self-pay

## 2022-03-22 ENCOUNTER — Encounter (HOSPITAL_BASED_OUTPATIENT_CLINIC_OR_DEPARTMENT_OTHER): Payer: Self-pay

## 2022-03-22 DIAGNOSIS — R413 Other amnesia: Secondary | ICD-10-CM | POA: Insufficient documentation

## 2022-03-22 DIAGNOSIS — Z5321 Procedure and treatment not carried out due to patient leaving prior to being seen by health care provider: Secondary | ICD-10-CM | POA: Insufficient documentation

## 2022-03-22 NOTE — ED Triage Notes (Addendum)
Pt presents with a gradual onset of memory loss for the last 5 months. Pt is requesting to be approved for disability at this time. Denies other illness such as CP, ShOB, GI, or urinary symptoms. Pt speaks [place]. Son is present to translate due to lack of medical interpretor. Pt A/Ox4 during triage.  ?

## 2022-03-25 ENCOUNTER — Other Ambulatory Visit: Payer: Self-pay

## 2022-03-25 ENCOUNTER — Emergency Department (HOSPITAL_BASED_OUTPATIENT_CLINIC_OR_DEPARTMENT_OTHER): Payer: Self-pay | Admitting: Radiology

## 2022-03-25 ENCOUNTER — Emergency Department (HOSPITAL_BASED_OUTPATIENT_CLINIC_OR_DEPARTMENT_OTHER): Payer: Self-pay

## 2022-03-25 ENCOUNTER — Emergency Department (HOSPITAL_BASED_OUTPATIENT_CLINIC_OR_DEPARTMENT_OTHER)
Admission: EM | Admit: 2022-03-25 | Discharge: 2022-03-25 | Disposition: A | Discharge status: Home / Self Care | Payer: Self-pay | Attending: Emergency Medicine | Admitting: Emergency Medicine

## 2022-03-25 ENCOUNTER — Encounter (HOSPITAL_BASED_OUTPATIENT_CLINIC_OR_DEPARTMENT_OTHER): Payer: Self-pay | Admitting: Emergency Medicine

## 2022-03-25 DIAGNOSIS — R930 Abnormal findings on diagnostic imaging of skull and head, not elsewhere classified: Secondary | ICD-10-CM

## 2022-03-25 DIAGNOSIS — Z79899 Other long term (current) drug therapy: Secondary | ICD-10-CM | POA: Insufficient documentation

## 2022-03-25 DIAGNOSIS — R9402 Abnormal brain scan: Secondary | ICD-10-CM | POA: Insufficient documentation

## 2022-03-25 DIAGNOSIS — E876 Hypokalemia: Secondary | ICD-10-CM | POA: Insufficient documentation

## 2022-03-25 DIAGNOSIS — R413 Other amnesia: Secondary | ICD-10-CM | POA: Insufficient documentation

## 2022-03-25 DIAGNOSIS — Z7982 Long term (current) use of aspirin: Secondary | ICD-10-CM | POA: Insufficient documentation

## 2022-03-25 DIAGNOSIS — D649 Anemia, unspecified: Secondary | ICD-10-CM | POA: Insufficient documentation

## 2022-03-25 LAB — COMPREHENSIVE METABOLIC PANEL
ALT: 8 U/L (ref 0–44)
AST: 14 U/L — ABNORMAL LOW (ref 15–41)
Albumin: 4.1 g/dL (ref 3.5–5.0)
Alkaline Phosphatase: 91 U/L (ref 38–126)
Anion gap: 7 (ref 5–15)
BUN: 10 mg/dL (ref 6–20)
CO2: 32 mmol/L (ref 22–32)
Calcium: 9 mg/dL (ref 8.9–10.3)
Chloride: 102 mmol/L (ref 98–111)
Creatinine, Ser: 0.53 mg/dL (ref 0.44–1.00)
GFR, Estimated: >60 mL/min (ref >60)
Glucose, Bld: 84 mg/dL (ref 70–99)
Potassium: 2.9 mmol/L — ABNORMAL LOW (ref 3.5–5.1)
Sodium: 141 mmol/L (ref 135–145)
Total Bilirubin: 0.5 mg/dL (ref 0.3–1.2)
Total Protein: 6.8 g/dL (ref 6.5–8.1)

## 2022-03-25 LAB — URINALYSIS, ROUTINE W REFLEX MICROSCOPIC
Bilirubin Urine: NEGATIVE
Glucose, UA: NEGATIVE mg/dL
Hgb urine dipstick: NEGATIVE
Ketones, ur: NEGATIVE mg/dL
Leukocytes,Ua: NEGATIVE
Nitrite: NEGATIVE
Protein, ur: NEGATIVE mg/dL
Specific Gravity, Urine: <1.005 — ABNORMAL LOW (ref 1.005–1.030)
pH: 7 (ref 5.0–8.0)

## 2022-03-25 LAB — CBC WITH DIFFERENTIAL/PLATELET
Abs Immature Granulocytes: 0.01 10*3/uL (ref 0.00–0.07)
Basophils Absolute: 0 10*3/uL (ref 0.0–0.1)
Basophils Relative: 0 %
Eosinophils Absolute: 0.1 10*3/uL (ref 0.0–0.5)
Eosinophils Relative: 2 %
HCT: 31.2 % — ABNORMAL LOW (ref 36.0–46.0)
Hemoglobin: 9.7 g/dL — ABNORMAL LOW (ref 12.0–15.0)
Immature Granulocytes: 0 %
Lymphocytes Relative: 37 %
Lymphs Abs: 1.9 10*3/uL (ref 0.7–4.0)
MCH: 22 pg — ABNORMAL LOW (ref 26.0–34.0)
MCHC: 31.1 g/dL (ref 30.0–36.0)
MCV: 70.9 fL — ABNORMAL LOW (ref 80.0–100.0)
Monocytes Absolute: 0.3 10*3/uL (ref 0.1–1.0)
Monocytes Relative: 6 %
Neutro Abs: 2.8 10*3/uL (ref 1.7–7.7)
Neutrophils Relative %: 55 %
Platelets: 245 10*3/uL (ref 150–400)
RBC: 4.4 MIL/uL (ref 3.87–5.11)
RDW: 14.7 % (ref 11.5–15.5)
WBC: 5.1 10*3/uL (ref 4.0–10.5)
nRBC: 0 % (ref 0.0–0.2)

## 2022-03-25 LAB — CBG MONITORING, ED: Glucose-Capillary: 109 mg/dL — ABNORMAL HIGH (ref 70–99)

## 2022-03-25 MED ORDER — POTASSIUM CHLORIDE CRYS ER 20 MEQ PO TBCR: 40.0000 meq | EXTENDED_RELEASE_TABLET | Freq: Once | ORAL | Status: DC

## 2022-03-25 MED ORDER — FERROUS SULFATE 325 (65 FE) MG PO TABS: 325.0000 mg | ORAL_TABLET | Freq: Every day | ORAL | 0 refills | Status: DC

## 2022-03-25 MED ORDER — POTASSIUM CHLORIDE ER 20 MEQ PO TBCR: 10.0000 meq | EXTENDED_RELEASE_TABLET | Freq: Every day | ORAL | 0 refills | Status: DC

## 2022-03-25 MED ORDER — ASPIRIN 81 MG PO TBEC: 81.0000 mg | DELAYED_RELEASE_TABLET | Freq: Every day | ORAL | 0 refills | Status: DC

## 2022-03-25 MED ORDER — ASPIRIN EC 325 MG PO TBEC: 325.0000 mg | DELAYED_RELEASE_TABLET | Freq: Every day | ORAL | 0 refills | Status: DC

## 2022-03-25 NOTE — ED Provider Notes (Signed)
?[facility]PT ?Provider Note ? ? ?CSN: [id number] ?Arrival date & time: 03/25/22  0944 ? ?  ? ?History ? ?Chief Complaint  ?Patient presents with  ? Memory Loss  ? ? ?[name] [name] is a 61 y.o. female. ? ?Pt is a 61 yo female with hx of colon perf following colonoscopy requiring partial colectomy.  She has also has a hx of sbo.  Pt denies taking any medications now.  Pt speaks a [place] dialect of [place] and her son-in-law interprets.  Pt wants to get a CT of her head and to be checked for causes of memory loss.  Her son-in-law said she has short term memory loss periodically.  She denies any headaches or trouble moving her legs or arms.  She denies any vision changes.   ? ? ?  ? ?Home Medications ?Prior to Admission medications   ?Medication Sig Start Date End Date Taking? Authorizing Provider  ?ferrous sulfate 325 (65 FE) MG tablet Take 1 tablet (325 mg total) by mouth daily. 03/25/22  Yes Jacalyn Lefevre, MD  ?potassium chloride 20 MEQ TBCR Take 10 mEq by mouth daily. 03/25/22  Yes Jacalyn Lefevre, MD  ?acetaminophen (TYLENOL) 500 MG tablet Take 1,000 mg by mouth every 6 (six) hours as needed for headache or moderate pain.    [provider]  ?aspirin EC 81 MG EC tablet Take 1 tablet (81 mg total) by mouth daily. 03/25/22   Jacalyn Lefevre, MD  ?Blood Pressure Monitor MISC Use to check blood pressure 2 to 3 times per week 09/30/21   Dana Allan, MD  ?pantoprazole (PROTONIX) 40 MG tablet Take 1 tablet (40 mg total) by mouth daily. 01/29/22   Orion Crook I, NP  ?polyethylene glycol powder (GLYCOLAX/MIRALAX) 17 GM/SCOOP powder Take 17 g by mouth in the morning and at bedtime. For one week to relieve abdominal pain. Afterwards use once daily for maintenance of bowel movements. 04/28/21   Fayette Pho, MD  ?sucralfate (CARAFATE) 1 GM/10ML suspension Take 10 mLs (1 g total) by mouth 4 (four) times daily -  with meals and at bedtime. Shake liquid. ?Patient not taking: Reported on 01/29/2022  09/30/21   Dana Allan, MD  ?   ? ?Allergies    ?Patient has no known allergies.   ? ?Review of Systems   ?Review of Systems  ?Neurological:   ?     Memory loss  ?All other systems reviewed and are negative. ? ?Physical Exam ?Updated Vital Signs ?BP (!) 161/88   Pulse (!) 51   Temp 98 ?F (36.7 ?C) (Temporal)   Resp (!) 29   SpO2 100%  ?Physical Exam ?Vitals and nursing note reviewed.  ?Constitutional:   ?   Appearance: Normal appearance.  ?HENT:  ?   Head: Normocephalic and atraumatic.  ?   Right Ear: External ear normal.  ?   Left Ear: External ear normal.  ?   Nose: Nose normal.  ?   Mouth/Throat:  ?   Mouth: Mucous membranes are moist.  ?   Pharynx: Oropharynx is clear.  ?Eyes:  ?   Extraocular Movements: Extraocular movements intact.  ?   Conjunctiva/sclera: Conjunctivae normal.  ?   Pupils: Pupils are equal, round, and reactive to light.  ?Cardiovascular:  ?   Rate and Rhythm: Normal rate and regular rhythm.  ?   Pulses: Normal pulses.  ?   Heart sounds: Normal heart sounds.  ?Pulmonary:  ?   Effort: Pulmonary effort is normal.  ?  Breath sounds: Normal breath sounds.  ?Abdominal:  ?   General: Abdomen is flat. Bowel sounds are normal.  ?   Palpations: Abdomen is soft.  ?Musculoskeletal:     ?   General: Normal range of motion.  ?   Cervical back: Normal range of motion and neck supple.  ?Skin: ?   General: Skin is warm.  ?   Capillary Refill: Capillary refill takes less than 2 seconds.  ?Neurological:  ?   General: No focal deficit present.  ?   Mental Status: She is alert and oriented to person, place, and time.  ?Psychiatric:     ?   Mood and Affect: Mood normal.     ?   Behavior: Behavior normal.  ? ? ?ED Results / Procedures / Treatments   ?Labs ?(all labs ordered are listed, but only abnormal results are displayed) ?Labs Reviewed  ?CBC WITH DIFFERENTIAL/PLATELET - Abnormal; Notable for the following components:  ?    Result Value  ? Hemoglobin 9.7 (*)   ? HCT 31.2 (*)   ? MCV 70.9 (*)   ? MCH 22.0  (*)   ? All other components within normal limits  ?COMPREHENSIVE METABOLIC PANEL - Abnormal; Notable for the following components:  ? Potassium 2.9 (*)   ? AST 14 (*)   ? All other components within normal limits  ?URINALYSIS, ROUTINE W REFLEX MICROSCOPIC - Abnormal; Notable for the following components:  ? Color, Urine COLORLESS (*)   ? Specific Gravity, Urine <1.005 (*)   ? All other components within normal limits  ?CBG MONITORING, ED - Abnormal; Notable for the following components:  ? Glucose-Capillary 109 (*)   ? All other components within normal limits  ? ? ?EKG ?EKG Interpretation ? ?Date/Time:  Thursday Mar 25 2022 11:24:43 EDT ?Ventricular Rate:  58 ?PR Interval:  144 ?QRS Duration: 93 ?QT Interval:  449 ?QTC Calculation: 441 ?R Axis:   73 ?Text Interpretation: Sinus rhythm Probable anteroseptal infarct, old No significant change since last tracing Confirmed by Jacalyn LefevreHaviland, Sherby Moncayo [phone]([id number]) on 03/25/2022 12:22:37 PM ? ?Radiology ?DG Chest 2 View ? ?Result Date: 03/25/2022 ?CLINICAL DATA:  Altered mental status. EXAM: CHEST - 2 VIEW COMPARISON:  Chest x-ray March 18, 2021 FINDINGS: No consolidation. No visible pleural effusions or pneumothorax. Biapical pleuroparenchymal scarring. Cardiomediastinal silhouette is within normal limits and similar to prior. IMPRESSION: No evidence of acute cardiopulmonary disease. Electronically Signed   By: Feliberto HartsFrederick S Jones M.D.   On: 03/25/2022 11:43  ? ?CT Head Wo Contrast ? ?Result Date: 03/25/2022 ?CLINICAL DATA:  Provided history: Mental status change, unknown cause. Memory loss. EXAM: CT HEAD WITHOUT CONTRAST TECHNIQUE: Contiguous axial images were obtained from the base of the skull through the vertex without intravenous contrast. RADIATION DOSE REDUCTION: This exam was performed according to the departmental dose-optimization program which includes automated exposure control, adjustment of the mA and/or kV according to patient size and/or use of iterative reconstruction  technique. COMPARISON:  None Available. FINDINGS: Brain: No age-advanced or lobar predominant parenchymal atrophy. Chronic appearing small-vessel infarcts within the bilateral corona radiata/basal ganglia. There is no acute intracranial hemorrhage. No demarcated cortical infarct. No extra-axial fluid collection. No evidence of an intracranial mass. No midline shift. Vascular: No hyperdense vessel. Atherosclerotic calcifications. Skull: Normal. Negative for fracture or focal lesion. Sinuses/Orbits: No mass or acute finding within the imaged orbits. Trace mucosal thickening within the bilateral ethmoid sinuses at the imaged levels. IMPRESSION: No evidence of acute intracranial abnormality. Chronic appearing small-vessel infarcts  within the bilateral corona radiata/basal ganglia. No age-advanced or lobar predominant parenchymal atrophy. Electronically Signed   By: Jackey Loge D.O.   On: 03/25/2022 11:54   ? ?Procedures ?Procedures  ? ? ?Medications Ordered in ED ?Medications  ?potassium chloride SA (KLOR-CON M) CR tablet 40 mEq (has no administration in time range)  ? ? ?ED Course/ Medical Decision Making/ A&P ?  ?                        ?Medical Decision Making ?Amount and/or Complexity of Data Reviewed ?Labs: ordered. ?Radiology: ordered. ? ?Risk ?OTC drugs. ?Prescription drug management. ? ? ?This patient presents to the ED for concern of memory loss, this involves an extensive number of treatment options, and is a complaint that carries with it a high risk of complications and morbidity.  The differential diagnosis includes infection, cva, brain tumor, electrolyte abn, early onset dementia ? ? ?Co morbidities that complicate the patient evaluation ? ?Illiteracy and language barrier ? ? ?Additional history obtained: ? ?Additional history obtained from epic chart review ?External records from outside source obtained and reviewed including son-in-law ? ? ?Lab Tests: ? ?I Ordered, and personally interpreted labs.  The  pertinent results include:  cbc with mild anemia (hgb 9.7); cmp nl with the exception of low K at 2.9; ua neg ? ? ?Imaging Studies ordered: ? ?I ordered imaging studies including ct head and CXR ?I independently

## 2022-03-25 NOTE — Discharge Instructions (Addendum)
Your CT scan of your head showed some chronic appearing old strokes.  You need to start taking a low dose ASA daily.  If this upsets your stomach, then stop taking it. You need to follow up with your doctor and with neurology. ? ?Your potassium is low today, so I want you to take a potassium pill for the next 2 weeks.  You will need to get your potassium rechecked by your doctor. ? ?You are a little anemic today.  You need to start taking iron.  This can cause constipation, so make sure you are eating extra fiber and drinking a lot of water. ?

## 2022-03-25 NOTE — ED Notes (Signed)
Patient transported to CT 

## 2022-03-25 NOTE — ED Triage Notes (Signed)
Pt arrives to ED with c/o memory loss. This started about a year ago per son-in-law. He reports this is mostly short term memory loss. Pt and son-in-law would like to speak about disability.  ?

## 2022-03-25 NOTE — ED Notes (Signed)
RT note: Blood Glucose/IV established, no complications. ?

## 2022-03-25 NOTE — ED Notes (Signed)
Dc instructions reviewed with patient. Patient voiced understanding. Dc with belongings. Pts son at bedside as welll. ?

## 2022-04-09 ENCOUNTER — Other Ambulatory Visit (HOSPITAL_COMMUNITY): Payer: Self-pay

## 2022-04-09 MED ORDER — PANTOPRAZOLE SODIUM 40 MG PO TBEC
40.0000 mg | DELAYED_RELEASE_TABLET | Freq: Every day | ORAL | 3 refills | Status: DC
  Filled 2022-04-09: qty 30, 30d supply, fill #0

## 2022-04-09 NOTE — Congregational Nurse Program (Signed)
Home visit per request [name] [name]  case manager at [facility].  He stated patient has questions about her medications and that she should be able to communicate with me in [place].  Met with patient and her son-in-law.  Reviewed medications with her and determined that she was not taking potassium correctly.  She has been taking a whole tablet instead of 1/2.  Explained directions to her and son-in-law and he will help make sure she cuts them in half.  She did not have pantoprazole because it was too expensive at [facility]. Phone call to [facility].  Cost of pantoprazole there is $5. Daughter will pick it up for her.  Jake Michaelis RN, Congregational Nurse [phone]

## 2022-04-14 ENCOUNTER — Other Ambulatory Visit (HOSPITAL_COMMUNITY): Payer: Self-pay

## 2022-05-04 ENCOUNTER — Encounter: Payer: Self-pay | Admitting: Neurology

## 2022-05-04 ENCOUNTER — Ambulatory Visit (INDEPENDENT_AMBULATORY_CARE_PROVIDER_SITE_OTHER): Payer: Self-pay | Admitting: Neurology

## 2022-05-04 ENCOUNTER — Telehealth: Payer: Self-pay | Admitting: Neurology

## 2022-05-04 VITALS — BP 134/79 | HR 73 | Ht <= 58 in | Wt 72.5 lb

## 2022-05-04 DIAGNOSIS — F03A0 Unspecified dementia, mild, without behavioral disturbance, psychotic disturbance, mood disturbance, and anxiety: Secondary | ICD-10-CM

## 2022-05-04 MED ORDER — DONEPEZIL HCL 5 MG PO TABS: 5.0000 mg | ORAL_TABLET | Freq: Every day | ORAL | 11 refills | Status: DC

## 2022-05-04 NOTE — Progress Notes (Signed)
[facility]  PATIENT: [name] [name] DOB: 02/22/1961  REQUESTING CLINICIAN: Isla Pence, MD HISTORY FROM: Patient with Interpreter Dash  REASON FOR VISIT: Memory decline    HISTORICAL  CHIEF COMPLAINT:  Chief Complaint  Patient presents with   New Patient (Initial Visit)    Rm 15. Accompanied by interpreter. NP/internal ED referral for memory loss, old infarcts found on CT.    HISTORY OF PRESENT ILLNESS:  This is a 61 year old woman past medical history of small bowel obstruction who is presenting with memory decline.  History obtained via interpreter-.  Patient reports memory problem that has been going on for a few years but worse in the past year.  She reports being forgetful about things that she wants to do, and this is getting worse.  She forgot her appointment, needs constant reminder.  She reported her kids always have to remind her a  lot of things or else she will forget.  She forgot her appointment today until her daughter reminded her.  She is not sure about her family history of dementia because she does not know her family.  Patient   TBI:   No past history of TBI Stroke:   no past history of stroke Seizures:   no past history of seizures Sleep:   no history of sleep apnea.   Mood: Reports low mood/depression in the past   Functional status: independent in some ADLs Patient lives with husband and kids  Cooking: yes  Cleaning: yes  Shopping: Cannot go by herself  Bathing: patient  Toileting: patient Driving: Yes  Bills: Kid   Ever left the stove on by accident?: yes  Forget how to use items around the house?: Trouble with using the remote, navigating TV Getting lost going to familiar places?: Yes  Forgetting loved ones names?: Yes Word finding difficulty? Yes  Sleep: Difficult with since surgery for small bowel    OTHER MEDICAL CONDITIONS: Small Bowel Obstruction    REVIEW OF SYSTEMS: Full 14 system review of systems performed and  negative with exception of: as noted in the HPI   ALLERGIES: No Known Allergies  HOME MEDICATIONS: Outpatient Medications Prior to Visit  Medication Sig Dispense Refill   acetaminophen (TYLENOL) 500 MG tablet Take 1,000 mg by mouth every 6 (six) hours as needed for headache or moderate pain.     aspirin EC 81 MG EC tablet Take 1 tablet (81 mg total) by mouth daily. 30 tablet 0   ferrous sulfate 325 (65 FE) MG tablet Take 1 tablet (325 mg total) by mouth daily. 30 tablet 0   pantoprazole (PROTONIX) 40 MG tablet Take 1 tablet (40 mg total) by mouth daily. 30 tablet 3   Blood Pressure Monitor MISC Use to check blood pressure 2 to 3 times per week (Patient not taking: Reported on 05/04/2022) 1 each 0   polyethylene glycol powder (GLYCOLAX/MIRALAX) 17 GM/SCOOP powder Take 17 g by mouth in the morning and at bedtime. For one week to relieve abdominal pain. Afterwards use once daily for maintenance of bowel movements. (Patient not taking: Reported on 05/04/2022) 3350 g 1   potassium chloride 20 MEQ TBCR Take 10 mEq by mouth daily. (Patient not taking: Reported on 05/04/2022) 14 tablet 0   sucralfate (CARAFATE) 1 GM/10ML suspension Take 10 mLs (1 g total) by mouth 4 (four) times daily -  with meals and at bedtime. Shake liquid. (Patient not taking: Reported on 01/29/2022) 420 mL 1   pantoprazole (PROTONIX) 40 MG tablet  Take 1 tablet (40 mg total) by mouth daily. (Patient not taking: Reported on 05/04/2022) 30 tablet 3   No facility-administered medications prior to visit.    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    Difficulty sleeping    Encounter for  completion of form with patient 10/23/2021   Headache 07/18/2018   Hypokalemia 06/27/2020   Influenza vaccine administered 08/06/2021   Literacy level of illiterate 10/01/2016   Less than one year of formal education in [place].  Ms Calabro is taking classes for reading and writing in [place].    S/P colostomy [facility]) 03/02/2015   March 2016: colon perforation following screening colonoscopy    S/P colostomy [facility]) 03/02/2015   March 2016: colon perforation following screening colonoscopy    SBO (small bowel obstruction) ([place]) 03/18/2021    PAST SURGICAL HISTORY: Past Surgical History:  Procedure Laterality Date   [place]   COLONOSCOPY N/A 05/30/2015   Procedure: COLONOSCOPY;  Surgeon: Leighton Ruff, MD;  Location: [facility] ENDOSCOPY;  Service: Endoscopy;  Laterality: N/A;   COLONOSCOPY WITH PROPOFOL N/A 06/12/2015   Procedure: COLONOSCOPY WITH PROPOFOL;  Surgeon: Leighton Ruff, MD;  Location: [facility] ENDOSCOPY;  Service: Endoscopy;  Laterality: N/A;   COLOSTOMY  01/2015   COLOSTOMY TAKEDOWN N/A 07/02/2015   Procedure: LAPAROSCOPIC COLOSTOMY REVERSAL SPLENIC FLEXURE MOBILIZATION AND PARTIAL COLECTOMY;  Surgeon: Leighton Ruff, MD;  Location: [facility] ORS;  Service: General;  Laterality: N/A;   DILATION AND CURETTAGE OF UTERUS  1990   "miscarriage"   LAPAROTOMY N/A 02/20/2015   Procedure: PARTIAL COLECTOMY END  ILEOSTOMY AND HARTMAN'S PROCEDURE;  Surgeon: Leighton Ruff, MD;  Location: [facility] ORS;  Service: General;  Laterality: N/A;    FAMILY HISTORY: Family History  Problem Relation Age of Onset   Colon cancer Neg Hx    Rectal cancer Neg Hx    Stomach cancer Neg Hx     SOCIAL HISTORY: Social History   Socioeconomic History   Marital status: Married    Spouse name: Not on file   Number of children: 2   Years of education: 0   Highest education level: Not on file  Occupational History   Occupation: ENVIRONMENTAL SERVICES    Employer: [employer]  Tobacco Use   Smoking status: Every Day    Packs/day: 0.50    Years: 37.00    Total pack years: 18.50    Types: Cigarettes   Smokeless tobacco: Never  Vaping Use   Vaping Use: Never used  Substance and Sexual Activity   Alcohol use: No    Alcohol/week: 0.0 standard drinks of alcohol   Drug use: No   Sexual activity: Not Currently  Other Topics Concern   Not on file  Social History Narrative   Lives with husband, daughter & her daughter's boyfriend & her grandchildren.   Illiterate in any language.   Less than one year of formal education in [place].    Work: [employer]   From: [place] but speaks English okay    Social Determinants of Radio broadcast assistant  Strain: Not on file  Food Insecurity: Not on file  Transportation Needs: Not on file  Physical Activity: Not on file  Stress: Not on file  Social Connections: Not on file  Intimate Partner Violence: Not on file    PHYSICAL EXAM  GENERAL EXAM/CONSTITUTIONAL: Vitals:  Vitals:   05/04/22 1001  BP: 134/79  Pulse: 73  Weight: 72 lb 8 oz (32.9 kg)  Height: 4\' 6"  (1.372 m)   Body mass index is 17.48 kg/m. Wt Readings from Last 3 Encounters:  05/04/22 72 lb 8 oz (32.9 kg)  03/19/22 72 lb 12.8 oz (33 kg)  01/29/22 70 lb 3.2 oz (31.8 kg)   Patient is in no distress; well developed, nourished and groomed; neck is supple, thin woman   EYES: Pupils round and reactive to light, Visual fields full to confrontation, Extraocular movements intacts,   MUSCULOSKELETAL: Gait, strength, tone, movements noted in Neurologic exam below  NEUROLOGIC: MENTAL STATUS:     05/04/2022   10:02 AM  MMSE - Mini Mental State Exam  Orientation to time 1  Orientation to Place 3  Registration 3  Attention/ Calculation 0  Recall 0  Language- name 2 objects 2  Language- repeat 0  Language- follow 3 step command 3  Language- read & follow direction 0  Language-read & follow direction-comments pt is  illiterate in any language  Write a sentence 0  Write a sentence-comments pt is illiterate in any language  Copy design 0  Total score 12    CRANIAL NERVE:  2nd, 3rd, 4th, 6th - pupils equal and reactive to light, visual fields full to confrontation, extraocular muscles intact, no nystagmus 5th - facial sensation symmetric 7th - facial strength symmetric 8th - hearing intact 9th - palate elevates symmetrically, uvula midline 11th - shoulder shrug symmetric 12th - tongue protrusion midline  MOTOR:  normal bulk and tone, full strength in the BUE, BLE  SENSORY:  normal and symmetric to light touch, vibration  COORDINATION:  finger-nose-finger, fine finger movements normal  REFLEXES:  deep tendon reflexes present and symmetric  GAIT/STATION:  normal   DIAGNOSTIC DATA (LABS, IMAGING, TESTING) - I reviewed patient records, labs, notes, testing and imaging myself where available.  Lab Results  Component Value Date   WBC 5.1 03/25/2022   HGB 9.7 (L) 03/25/2022   HCT 31.2 (L) 03/25/2022   MCV 70.9 (L) 03/25/2022   PLT 245 03/25/2022      Component Value Date/Time   NA 141 03/25/2022 1137   NA 138 04/22/2021 1539   K 2.9 (L) 03/25/2022 1137   CL 102 03/25/2022 1137   CO2 32 03/25/2022 1137   GLUCOSE 84 03/25/2022 1137   BUN 10 03/25/2022 1137   BUN 6 04/22/2021 1539   CREATININE 0.53 03/25/2022 1137   CREATININE 0.57 12/10/2013 1103   CALCIUM 9.0 03/25/2022 1137   PROT 6.8 03/25/2022 1137   PROT 6.8 11/13/2020 1656   ALBUMIN 4.1 03/25/2022 1137   ALBUMIN 4.4 11/13/2020 1656   AST 14 (L) 03/25/2022 1137   ALT 8 03/25/2022 1137   ALKPHOS 91 03/25/2022 1137   BILITOT 0.5 03/25/2022 1137   BILITOT 0.3 11/13/2020 1656   GFRNONAA >60 03/25/2022 1137   GFRAA 114 11/13/2020 1656   Lab Results  Component Value Date   CHOL 170 01/29/2022   HDL 53 01/29/2022   LDLCALC 97 01/29/2022   TRIG 110 01/29/2022   CHOLHDL 3.2 01/29/2022   Lab Results  Component Value  Date  HGBA1C 5.2 01/29/2022   HGBA1C 5.2 01/29/2022   HGBA1C 5.2 (A) 01/29/2022   HGBA1C 5.2 01/29/2022   Lab Results  Component Value Date   [id number] 12/10/2013   Lab Results  Component Value Date   TSH 0.418 01/01/2014     ASSESSMENT AND PLAN  61 y.o. year old female with history of small bowel obstruction who is presenting with memory decline for the past few years but worse in the past year.  Memory problem described as being forgetful, needed to be reminded a lot or if she will forget, for instance she forgot her appointment today.  Patient lives at home with husband and children, she had forget items on the stove while cooking, she still drives but only goes to familiar places.  On exam today she scored a 12 out of 30 on the Mini-Mental status exam but this may be compounded by language barrier and education level.  But still, it is a low score  and consistent with memory decline.  Patient likely has mild dementia without behavioral disturbance, I will start her on Aricept nightly.  I will also obtain a B12 TSH and RPR.  I will contact her to go over the results otherwise I will see her in 6 months for follow-up.  He is comfortable with plans.   1. Mild dementia without behavioral disturbance, psychotic disturbance, mood disturbance, or anxiety, unspecified dementia type [facility])      Patient Instructions  Dementia labs today: B12, TSH and RPR  MRI Brain without contrast  Start Aricept 5 mg nightly, if you experience severe diarrhea, please stop the medication and call the office  Return in 6 months   Orders Placed This Encounter  Procedures   MR BRAIN WO CONTRAST   TSH   Vitamin B12   RPR    Meds ordered this encounter  Medications   donepezil (ARICEPT) 5 MG tablet    Sig: Take 1 tablet (5 mg total) by mouth at bedtime.    Dispense:  30 tablet    Refill:  11    Return in about 6 months (around 11/03/2022).  I have spent a total of 65 minutes dedicated to  this patient today, preparing to see patient, performing a medically appropriate examination and evaluation, ordering tests and/or medications and procedures, and counseling and educating the patient/family/caregiver; independently interpreting result and communicating results to the family/patient/caregiver; and documenting clinical information in the electronic medical record.   Alric Ran, MD 05/04/2022, 4:48 PM  [facility] [address], [place], [place] [address] [phone]

## 2022-05-04 NOTE — Patient Instructions (Signed)
Dementia labs today: B12, TSH and RPR  MRI Brain without contrast  Start Aricept 5 mg nightly, if you experience severe diarrhea, please stop the medication and call the office  Return in 6 months

## 2022-05-04 NOTE — Telephone Encounter (Signed)
self-pay sent to GI  

## 2022-05-05 LAB — RPR: RPR Ser Ql: NONREACTIVE

## 2022-05-05 LAB — VITAMIN B12: Vitamin B-12: 492 pg/mL (ref 232–1245)

## 2022-05-05 LAB — TSH: TSH: 0.911 u[IU]/mL (ref 0.450–4.500)

## 2022-05-05 NOTE — Progress Notes (Signed)
Please call and advise the patient that the recent labs we checked were within normal limits. No further action is required on these tests at this time. Please remind patient to keep any upcoming appointments or tests and to call us with any interim questions, concerns, problems or updates. Thanks, she will need [place] translation   Alric Ran, MD

## 2022-05-06 ENCOUNTER — Telehealth: Payer: Self-pay | Admitting: *Deleted

## 2022-05-06 NOTE — Telephone Encounter (Signed)
-----   Message from Windell Norfolk, MD sent at 05/05/2022  8:02 AM EDT ----- Please call and advise the patient that the recent labs we checked were within normal limits. No further action is required on these tests at this time. Please remind patient to keep any upcoming appointments or tests and to call us with any interim questions, concerns, problems or updates. Thanks, she will need [place] translation   Windell Norfolk, MD

## 2022-05-06 NOTE — Telephone Encounter (Signed)
Tried calling interpreter line at [phone] 06/14 and today. They did not have [place] interpreter available to use, had no timeframe of when one would be available.  Tried daughter, Victorino Dike at [phone]. Number not in service. Called daughter, Morrie Sheldon who speaks english. Relayed lab results per Dr. Karie Georges note. She verbalized understanding and appreciation.

## 2022-05-07 ENCOUNTER — Ambulatory Visit: Payer: Self-pay | Admitting: Nurse Practitioner

## 2022-08-19 NOTE — Congregational Nurse Program (Signed)
Home visit with interpreter Jeris Penta.  Patient c/o chronic left lower quadrant abdominal pain worsening over several months. Patient has been checking BP with readings fluctuating from normal to elevated.  Advised her to keep written record but if not to take BP machine with her to next MD appointment.  Patient has not been taking medications.  CN supplied patient with pill box and filled it for 1 week.  She seemed to understand how to take medications.  Also advised her to take her medication bottles with her to next appointment.  Patient requested a change of providers from [place] back to [place] due to proximity to her house.  Phone call to [facility] who stated that she would have to be seen as a new patient and they are not currently taking new patients.  Phone call to [facility] Internal Medicine clinic.  They can see patient on 09/01/2022 @ 2:15 pm.  Gave patient written reminder for appointment.  Jake Michaelis RN, Congregational Nurse [phone]

## 2022-09-01 ENCOUNTER — Encounter: Payer: Self-pay | Admitting: Family Medicine

## 2022-09-01 ENCOUNTER — Other Ambulatory Visit: Payer: Self-pay

## 2022-09-01 ENCOUNTER — Other Ambulatory Visit (HOSPITAL_COMMUNITY): Payer: Self-pay

## 2022-09-01 ENCOUNTER — Ambulatory Visit: Payer: Self-pay | Admitting: Family Medicine

## 2022-09-01 VITALS — BP 154/75 | HR 71 | Temp 98.1°F | Ht <= 58 in | Wt 75.0 lb

## 2022-09-01 DIAGNOSIS — Z0000 Encounter for general adult medical examination without abnormal findings: Secondary | ICD-10-CM

## 2022-09-01 DIAGNOSIS — R1032 Left lower quadrant pain: Secondary | ICD-10-CM

## 2022-09-01 DIAGNOSIS — I10 Essential (primary) hypertension: Secondary | ICD-10-CM

## 2022-09-01 DIAGNOSIS — F17210 Nicotine dependence, cigarettes, uncomplicated: Secondary | ICD-10-CM

## 2022-09-01 MED ORDER — AMLODIPINE BESYLATE 5 MG PO TABS: 5.0000 mg | ORAL_TABLET | Freq: Every day | ORAL | 0 refills | Status: DC

## 2022-09-01 MED ORDER — AMLODIPINE BESYLATE 5 MG PO TABS
5.0000 mg | ORAL_TABLET | Freq: Every day | ORAL | 1 refills | Status: DC
  Filled 2022-09-01: qty 30, 30d supply, fill #0

## 2022-09-01 NOTE — Assessment & Plan Note (Signed)
Patient was given the information for mammogram and Pap smear.  Patient will call to schedule appointment at her earliest convenience.

## 2022-09-01 NOTE — Progress Notes (Signed)
CC: Establish PCP and evaluation for BP, abdominal pain and to establish primary care physician.  HPI: [name] [name] is a 61 y.o. female with past medical history listed below presenting to clinic for hypertension evaluation and abdominal pain.  Patient states her blood pressure is running high for few months.  Patient is keeping a blood pressure log.  Her systolic blood pressure is consistently higher than 683 and diastolic blood pressure readings are between 98-105.  Patient states that she is on blood pressure medication however she did not bring the bottle with her.  Her blood pressure in the clinic 154/75.  Patient also complaining of abdominal pain which is going on for several years however pain is worse since March 2023.  Pain is localized to left lower quadrant.  Pain is constant, pressure to dull in nature, nonradiating.  No associated nausea, vomiting, fever, chills, diarrhea, constipation, blood in the stool or dark-colored stool, dysuria.  Patient had screening colonoscopy with colonic perforation in 2016.  Patient had colectomy and secondary perforation.  Patient had multiple abdominal surgeries including hysterectomy, C-section, appendectomy and history of small bowel obstruction.  Patient do not have health coverage.  Patient was given the paperwork to apply for orange card to help with the visits and the medications.  Patient presented to the clinic with an interpreter.  For details of today's visit and the status of his chronic medical issues please refer to the assessment and plan.   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    Difficulty sleeping     Encounter for completion of form with patient 10/23/2021   Headache 07/18/2018   Hypokalemia 06/27/2020   Influenza vaccine administered 08/06/2021   Literacy level of illiterate 10/01/2016   Less than one year of formal education in [place].  Ms Lacek is taking classes for reading and writing in [place].    S/P colostomy [facility]) 03/02/2015   March 2016: colon perforation following screening colonoscopy    S/P colostomy [facility]) 03/02/2015   March 2016: colon perforation following screening colonoscopy    SBO (small bowel obstruction) ([place]) 03/18/2021   Review of Systems: Review of Systems  Constitutional: Negative.   Gastrointestinal:  Positive for abdominal pain. Negative for blood in stool, constipation, diarrhea, nausea and vomiting.  Genitourinary:  Negative for dysuria.     Physical Exam:Physical Exam Vitals and nursing note reviewed.  Constitutional:      Appearance: She is well-developed.  Cardiovascular:     Rate and Rhythm: Normal rate and regular rhythm.  Pulmonary:     Effort: Pulmonary effort is normal.     Breath sounds: Normal breath sounds.  Abdominal:     General: A surgical scar is present. Bowel sounds are normal. There is no distension or abdominal bruit. There are no signs of injury.     Palpations: Abdomen is soft. There is no mass.     Tenderness: There is abdominal tenderness in the left lower quadrant. There is guarding. There is no rebound.       Comments: Abdomen soft, tender to palpation to left lower quadrant.  Patient also has pain around the surgical scars.  Skin:    General: Skin is warm.  Neurological:     Mental Status: She is alert and oriented to person, place, and time.      Vitals:   09/01/22 1438  BP: (!) 154/75  Pulse: 71  Temp: 98.1 F (36.7 C)  TempSrc: Oral  SpO2: 100%  Weight: 75 lb (34 kg)  Height: 4\' 7"  (1.397 m)     Assessment & Plan:   See Encounters Tab for problem based charting.  Patient seen with Dr.  Saverio Danker

## 2022-09-01 NOTE — Patient Instructions (Signed)
Prescription for blood pressure medication was sent to the pharmacy Order for the blood work was placed.  Please collect the paperwork for orange card at the front desk.  Once your orange  gets approved return to the clinic for the blood work. Also you can call the stomach doctor for an appointment once you get your orange card approved. You can take Tylenol 500 mg 2 tablets 3 times a day as needed for abdominal pain.

## 2022-09-01 NOTE — Assessment & Plan Note (Signed)
Patient reports abdominal pain for several years however it is worse since March 2023.  Pain localized to left lower quadrant and over the scar tissues from previous surgeries.  No indication of acute abdomen.  Patient had endoscopy with pathology in October 2022 with no findings for dysplasia, malignancy or H. pylori.  After reviewing the medical and surgical history and performing physical exam I anticipate the abdominal pain is highly likely from scar tissue from multiple abdominal surgeries.  Patient were seen gastroenterologist with [facility].  Patient will call to schedule an appointment once she gets approved for orange card.

## 2022-09-01 NOTE — Assessment & Plan Note (Signed)
Patient has blood pressure log for couple months which reflected hypertension.  Her blood pressure in the clinic was 154/75.  Patient claimed that she is on blood pressure medication.  I called the pharmacy to confirm the medication.  I found out from the pharmacy that patient was never on a blood pressure medication.  However after reviewing the blood pressure log and her elevated blood pressure in the clinic plan is to start patient on amlodipine. Patient was encouraged to continue monitor blood pressure. Plan: Start amlodipine 5 mg 1 tablet daily -BNP as future order.  Patient is applying for orange card.  Once her card gets approved she will return to the clinic for blood draw.

## 2022-09-02 ENCOUNTER — Other Ambulatory Visit (HOSPITAL_COMMUNITY): Payer: Self-pay

## 2022-09-02 ENCOUNTER — Ambulatory Visit: Payer: Self-pay | Admitting: Internal Medicine

## 2022-09-02 NOTE — Addendum Note (Signed)
Addended by: Charise Killian on: 09/02/2022 10:23 AM   Modules accepted: Level of Service

## 2022-09-02 NOTE — Progress Notes (Signed)
Internal Medicine Clinic Attending  I saw and evaluated the patient.  I personally confirmed the key portions of the history and exam documented by Dr. Jeanett Schlein and I reviewed pertinent patient test results.  The assessment, diagnosis, and plan were formulated together and I agree with the documentation in the resident's note. Previously evaluated by GI for abdominal pain, plan for next steps was to be evaluated by general surgery however it does not appear this was able to be completed. I agree that pain seems to be localized around surgical scars and [name] [name] is quite tender to light palpation over these areas. Recommended conservative pain management with acetaminophen as she has not tried anything yet and would like her to re-establish with GI and f/u with general surgery once she has insurance coverage. She has declined lab work today due to cost, we will plan for BMP at f/u visit once she has obtained coverage.

## 2022-09-10 ENCOUNTER — Other Ambulatory Visit (HOSPITAL_COMMUNITY): Payer: Self-pay

## 2022-09-16 ENCOUNTER — Other Ambulatory Visit: Payer: Self-pay

## 2022-10-01 ENCOUNTER — Other Ambulatory Visit: Payer: Self-pay

## 2022-10-01 ENCOUNTER — Encounter: Payer: Self-pay | Admitting: Student

## 2022-10-01 ENCOUNTER — Other Ambulatory Visit (HOSPITAL_COMMUNITY): Payer: Self-pay

## 2022-10-01 ENCOUNTER — Ambulatory Visit (INDEPENDENT_AMBULATORY_CARE_PROVIDER_SITE_OTHER): Payer: Self-pay | Admitting: Student

## 2022-10-01 VITALS — BP 137/74 | HR 66 | Temp 98.5°F | Ht <= 58 in | Wt 74.5 lb

## 2022-10-01 DIAGNOSIS — R1012 Left upper quadrant pain: Secondary | ICD-10-CM

## 2022-10-01 DIAGNOSIS — R1030 Lower abdominal pain, unspecified: Secondary | ICD-10-CM

## 2022-10-01 DIAGNOSIS — K5909 Other constipation: Secondary | ICD-10-CM

## 2022-10-01 DIAGNOSIS — I10 Essential (primary) hypertension: Secondary | ICD-10-CM

## 2022-10-01 MED ORDER — POLYETHYLENE GLYCOL 3350 17 GM/SCOOP PO POWD
17.0000 g | Freq: Two times a day (BID) | ORAL | 1 refills | Status: DC
  Filled 2022-10-01: qty 3350, 99d supply, fill #0

## 2022-10-01 MED ORDER — PANTOPRAZOLE SODIUM 40 MG PO TBEC
40.0000 mg | DELAYED_RELEASE_TABLET | Freq: Every day | ORAL | 3 refills | Status: DC
  Filled 2022-10-01: qty 30, 30d supply, fill #0

## 2022-10-01 NOTE — Patient Instructions (Addendum)
Thank you, Ms.Braxton Low for allowing [place] to provide your care today.  -Please restart Amlodipine 5 mg daily. -Measure your blood pressure at home a few times per week. Record the blood pressure on the paper we gave you.  -If top number is less than 100, hold the Amlodipine and call our clinic.   -Refill for Protonix and Miralax for your constipation. Please take Miralax 1 packet a day and can go up to 2 packets per day. Protonix is 40 mg daily.   -Orange card application given today.  -Blood work today to check electrolytes and kidneys.    I have ordered the following labs for you: -BMP   I have ordered the following medication/changed the following medications:   Stop the following medications: Medications Discontinued During This Encounter  Medication Reason   potassium chloride 20 MEQ TBCR    sucralfate (CARAFATE) 1 GM/10ML suspension    polyethylene glycol powder (GLYCOLAX/MIRALAX) 17 GM/SCOOP powder Reorder   pantoprazole (PROTONIX) 40 MG tablet Reorder     Start the following medications: Meds ordered this encounter  Medications   pantoprazole (PROTONIX) 40 MG tablet    Sig: Take 1 tablet (40 mg total) by mouth daily.    Dispense:  30 tablet    Refill:  3    IM program   polyethylene glycol powder (GLYCOLAX/MIRALAX) 17 GM/SCOOP powder    Sig: Take 17 g by mouth in the morning and at bedtime.    Dispense:  3350 g    Refill:  1    IM program     Follow up:  2-4 weeks    Remember: Please bring blood pressure log to your next visit.   Should you have any questions or concerns please call the internal medicine clinic at [phone].    Rana Snare, D.O. [facility]

## 2022-10-01 NOTE — Assessment & Plan Note (Signed)
Patient with chronic abdominal pain for years. Left sided pain that worsens with eating and constipation. Unsure when last BM was but can go days without one. Takes Miralax only when feeling constipated then stops. Not taking anything else. History of multiple abdominal surgeries. 2022 CT abd/pelvis did not show any acute process. 2022 endoscopy showed gastritis and duodenitis. Exam today showed left sided abdominal tenderness with minimal guarding. No peritoneal signs. Discussed restarting her PPI and following a daily bowel regimen for chronic gastritis and constipation which may help with her abdominal pain.   Plan -refilled Protonix 40 mg daily -refilled Miralax and advised patient can titrate up to 2 packets a day -provided orange card application again, referral to GI once approved

## 2022-10-01 NOTE — Progress Notes (Signed)
CC: HTN follow up  HPI:  [name] [name] is a 61 y.o. female living with a history stated below and presents today for HTN follow up. Please see problem based assessment and plan for additional details.  Past Medical History:  Diagnosis Date   Anemia 07/27/2016   Annual physical exam 06/27/2020   Atypical chest pain 08/07/2020   Clotting disorder ([facility])    Colostomy stricture ([facility]) 07/02/2015   Constipation    Cough 08/07/2020   Depression    Diarrhea 11/13/2020   Acute watery diarrhea starting 12/14 associated with abdominal pain, headache, feeling "hot and cold".    Difficulty sleeping    Encounter for completion of form with patient 10/23/2021   Headache 07/18/2018   Hypokalemia 06/27/2020   Influenza vaccine administered 08/06/2021   Literacy level of illiterate 10/01/2016   Less than one year of formal education in [place].  Ms Mewborn is taking classes for reading and writing in [place].    S/P colostomy [facility]) 03/02/2015   March 2016: colon perforation following screening colonoscopy    S/P colostomy [facility]) 03/02/2015   March 2016: colon perforation following screening colonoscopy    SBO (small bowel obstruction) ([facility]) 03/18/2021    Current Outpatient Medications on File Prior to Visit  Medication Sig Dispense Refill   acetaminophen (TYLENOL) 500 MG tablet Take 1,000 mg by mouth every 6 (six) hours as needed for headache or moderate pain.     amLODipine (NORVASC) 5 MG tablet Take 1 tablet (5 mg total) by mouth daily. 90 tablet 1   aspirin EC 81 MG EC tablet Take 1 tablet (81 mg total) by mouth daily. 30 tablet 0   Blood Pressure Monitor MISC Use to check blood pressure 2 to 3 times per week (Patient not taking: Reported on 05/04/2022) 1 each 0   donepezil (ARICEPT) 5 MG tablet Take 1 tablet (5 mg total) by mouth at bedtime. 30 tablet 11   ferrous sulfate 325 (65 FE) MG tablet Take 1 tablet (325 mg total) by mouth daily. 30 tablet 0   No current facility-administered medications on  file prior to visit.   Review of Systems: ROS negative except for what is noted on the assessment and plan.  Vitals:   10/01/22 1007 10/01/22 1050  BP: (!) 163/76 137/74  Pulse: 70 66  Temp: 98.5 F (36.9 C)   TempSrc: Oral   SpO2: 99%   Weight: 74 lb 8 oz (33.8 kg)   Height: 4\' 7"  (1.397 m)    Physical Exam: Constitutional: patient is thin appearing, sitting in chair, in no acute distress HENT: normocephalic atraumatic Neck: supple Cardiovascular: regular rate and rhythm, no m/r/g Pulmonary/Chest: normal work of breathing on room air, lungs clear to auscultation bilaterally Abdominal: soft, non-distended, diffuse left-sided tenderness, minimal guarding, healed surgical scars noted MSK: normal bulk and tone Neurological: alert & oriented x 3 Skin: warm and dry Psych: normal mood and behavior  Assessment & Plan:   HTN (hypertension)    10/01/2022   10:50 AM 10/01/2022   10:07 AM 09/01/2022    2:38 PM  Vitals  Systolic 137 163 11/01/2022  Diastolic 74 76 75  Pulse 66 70 71    Repeat BP check showed improvement. She has not been taking her amlodipine 5 mg for past week. She reports dizziness at home past few weeks but has since resolved. Has BP cuff and checks at times but does not remember past readings. Will have her complete BP log and have  her bring sheet back at next visit.   Plan -restart amlodipine 5 mg daily -provided home BP log to complete for next visit -advised patient if readings of SBP <100 to hold amlodipine and contact clinic -BMP today  Abdominal pain Patient with chronic abdominal pain for years. Left sided pain that worsens with eating and constipation. Unsure when last BM was but can go days without one. Takes Miralax only when feeling constipated then stops. Not taking anything else. History of multiple abdominal surgeries. 2022 CT abd/pelvis did not show any acute process. 2022 endoscopy showed gastritis and duodenitis. Exam today showed left sided  abdominal tenderness with minimal guarding. No peritoneal signs. Discussed restarting her PPI and following a daily bowel regimen for chronic gastritis and constipation which may help with her abdominal pain.   Plan -refilled Protonix 40 mg daily -refilled Miralax and advised patient can titrate up to 2 packets a day -provided orange card application again, referral to GI once approved    Patient seen with Dr. Layne Benton, D.O. [facility], PGY-1 Phone: [phone] Date 10/01/2022 Time 4:09 PM

## 2022-10-01 NOTE — Assessment & Plan Note (Addendum)
    10/01/2022   10:50 AM 10/01/2022   10:07 AM 09/01/2022    2:38 PM  Vitals  Systolic 137 163 744  Diastolic 74 76 75  Pulse 66 70 71    Repeat BP check showed improvement. She has not been taking her amlodipine 5 mg for past week. She reports dizziness at home past few weeks but has since resolved. Has BP cuff and checks at times but does not remember past readings. Will have her complete BP log and have her bring sheet back at next visit.   Plan -restart amlodipine 5 mg daily -provided home BP log to complete for next visit -advised patient if readings of SBP <100 to hold amlodipine and contact clinic -BMP today

## 2022-10-04 ENCOUNTER — Other Ambulatory Visit (HOSPITAL_COMMUNITY): Payer: Self-pay

## 2022-10-04 LAB — BMP8+ANION GAP
Anion Gap: 14 mmol/L (ref 10.0–18.0)
BUN/Creatinine Ratio: 10 — ABNORMAL LOW (ref 12–28)
BUN: 6 mg/dL — ABNORMAL LOW (ref 8–27)
CO2: 29 mmol/L (ref 20–29)
Calcium: 9.4 mg/dL (ref 8.7–10.3)
Chloride: 97 mmol/L (ref 96–106)
Creatinine, Ser: 0.58 mg/dL (ref 0.57–1.00)
Glucose: 87 mg/dL (ref 70–99)
Potassium: 3.4 mmol/L — ABNORMAL LOW (ref 3.5–5.2)
Sodium: 140 mmol/L (ref 134–144)
eGFR: 104 mL/min/{1.73_m2} (ref >59)

## 2022-10-11 NOTE — Progress Notes (Signed)
Internal Medicine Clinic Attending  I saw and evaluated the patient.  I personally confirmed the key portions of the history and exam documented by Dr. Zheng and I reviewed pertinent patient test results.  The assessment, diagnosis, and plan were formulated together and I agree with the documentation in the resident's note.  

## 2022-11-03 ENCOUNTER — Ambulatory Visit: Payer: Self-pay | Admitting: Neurology

## 2023-02-15 ENCOUNTER — Ambulatory Visit: Payer: Self-pay | Admitting: Neurology

## 2023-02-16 ENCOUNTER — Encounter: Payer: Self-pay | Admitting: Neurology

## 2023-02-16 ENCOUNTER — Ambulatory Visit (INDEPENDENT_AMBULATORY_CARE_PROVIDER_SITE_OTHER): Payer: Self-pay | Admitting: Neurology

## 2023-02-16 VITALS — BP 149/89 | HR 71 | Ht <= 58 in | Wt 75.5 lb

## 2023-02-16 DIAGNOSIS — I10 Essential (primary) hypertension: Secondary | ICD-10-CM

## 2023-02-16 DIAGNOSIS — F03A0 Unspecified dementia, mild, without behavioral disturbance, psychotic disturbance, mood disturbance, and anxiety: Secondary | ICD-10-CM

## 2023-02-16 MED ORDER — AMLODIPINE BESYLATE 5 MG PO TABS: 5.0000 mg | ORAL_TABLET | Freq: Every day | ORAL | 1 refills | Status: DC

## 2023-02-16 MED ORDER — DONEPEZIL HCL 5 MG PO TABS: 5.0000 mg | ORAL_TABLET | Freq: Every day | ORAL | 11 refills | Status: DC

## 2023-02-16 NOTE — Progress Notes (Signed)
[facility]  PATIENT: [name] [name] DOB: August 13, 1961  REQUESTING CLINICIAN: Teena Dunk, NP HISTORY FROM: Patient with Interpreter Dash  REASON FOR VISIT: Memory decline    HISTORICAL  CHIEF COMPLAINT:  Chief Complaint  Patient presents with   Follow-up    Rm 29, son in law and interpreter present, she lost her medcaid and could not afford she is paying out of pocket and trying to reapply for assistance. Can we give her charity care assistance? Dementia son in law stated still about the same    INTERVAL HISTORY 02/16/2023: Patient presents today for follow-up, she is accompanied by her son in law and interpreter.  Last visit was in June and at that time we diagnosed her with mild dementia and started her on Aricept.  Unfortunately she lost her insurance and could not get the Aricept.  Grandson reports that she is stable, she is still forgetful, needs reminders but otherwise she is stable, denies any agitation, she is not wandering out.  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   [place]:  This is a 62 year old woman past medical history of small bowel obstruction who is presenting with memory decline.  History obtained via interpreter-.  Patient reports memory problem that has been going on for a few years but worse in the past year.  She reports being forgetful about things that she wants to do, and this is getting worse.  She forgot her appointment, needs constant reminder.  She reported her kids always have to remind her a  lot of things or else she will forget.  She forgot her appointment today until her daughter reminded her.  She is not sure about her family history of dementia because she does not know her family.  Patient   TBI:   No past history of TBI Stroke:   no past history of stroke Seizures:   no past history of seizures Sleep:   no history of sleep apnea.   Mood: Reports  low mood/depression in the past   Functional status: independent in some ADLs Patient lives with husband and kids  Cooking: yes  Cleaning: yes  Shopping: Cannot go by herself  Bathing: patient  Toileting: patient Driving: Yes  Bills: Kid   Ever left the stove on by accident?: yes  Forget how to use items around the house?: Trouble with using the remote, navigating TV Getting lost going to familiar places?: Yes  Forgetting loved ones names?: Yes Word finding difficulty? Yes  Sleep: Difficult with since surgery for small bowel    OTHER MEDICAL CONDITIONS: Small Bowel Obstruction    REVIEW OF SYSTEMS: Full 14 system review of systems performed and negative with exception of: as noted in the HPI   ALLERGIES: No Known Allergies  HOME MEDICATIONS: Outpatient Medications Prior to Visit  Medication Sig Dispense Refill   acetaminophen (TYLENOL) 500 MG tablet Take 1,000 mg by mouth every 6 (six) hours as needed for headache or moderate pain.     amLODipine (NORVASC) 5 MG tablet Take 1 tablet (5 mg total) by mouth daily. 90 tablet 1   aspirin EC 81 MG EC tablet Take 1 tablet (81 mg total) by mouth daily. 30 tablet 0   Blood Pressure Monitor MISC Use to check blood pressure 2 to 3 times per week (Patient not taking: Reported on 05/04/2022) 1 each 0   donepezil (ARICEPT) 5 MG  tablet Take 1 tablet (5 mg total) by mouth at bedtime. 30 tablet 11   ferrous sulfate 325 (65 FE) MG tablet Take 1 tablet (325 mg total) by mouth daily. 30 tablet 0   pantoprazole (PROTONIX) 40 MG tablet Take 1 tablet (40 mg total) by mouth daily. 30 tablet 3   polyethylene glycol powder (GLYCOLAX/MIRALAX) 17 GM/SCOOP powder Take 17 g by mouth in the morning and at bedtime. 3350 g 1   No facility-administered medications prior to visit.    PAST MEDICAL HISTORY: Past Medical History:  Diagnosis Date   Anemia 07/27/2016   Annual physical exam 06/27/2020   Atypical chest pain 08/07/2020   Clotting disorder ([place])     Colostomy stricture ([place]) 07/02/2015   Constipation    Cough 08/07/2020   Depression    Diarrhea 11/13/2020   Acute watery diarrhea starting 12/14 associated with abdominal pain, headache, feeling "hot and cold".    Difficulty sleeping    Encounter for completion of form with patient 10/23/2021   Headache 07/18/2018   Hypokalemia 06/27/2020   Influenza vaccine administered 08/06/2021   Literacy level of illiterate 10/01/2016   Less than one year of formal education in [place].  Ms Marceau is taking classes for reading and writing in [place].    S/P colostomy [facility]) 03/02/2015   March 2016: colon perforation following screening colonoscopy    S/P colostomy [facility]) 03/02/2015   March 2016: colon perforation following screening colonoscopy    SBO (small bowel obstruction) ([place]) 03/18/2021    PAST SURGICAL HISTORY: Past Surgical History:  Procedure Laterality Date   [place]   COLONOSCOPY N/A 05/30/2015   Procedure: COLONOSCOPY;  Surgeon: Leighton Ruff, MD;  Location: [facility];  Service: Endoscopy;  Laterality: N/A;   COLONOSCOPY WITH PROPOFOL N/A 06/12/2015   Procedure: COLONOSCOPY WITH PROPOFOL;  Surgeon: Leighton Ruff, MD;  Location: [facility];  Service: Endoscopy;  Laterality: N/A;   COLOSTOMY  01/2015   COLOSTOMY TAKEDOWN N/A 07/02/2015   Procedure: LAPAROSCOPIC COLOSTOMY REVERSAL SPLENIC FLEXURE MOBILIZATION AND PARTIAL COLECTOMY;  Surgeon: Leighton Ruff, MD;  Location: [facility];  Service: General;  Laterality: N/A;   DILATION AND CURETTAGE OF UTERUS  1990   "miscarriage"   LAPAROTOMY N/A 02/20/2015   Procedure: PARTIAL COLECTOMY END  ILEOSTOMY AND HARTMAN'S PROCEDURE;  Surgeon: Leighton Ruff, MD;  Location: [facility];  Service: General;  Laterality: N/A;    FAMILY HISTORY: Family History  Problem Relation Age of Onset   Colon cancer Neg Hx    Rectal cancer Neg Hx    Stomach cancer Neg Hx     SOCIAL  HISTORY: Social History   Socioeconomic History   Marital status: Married    Spouse name: Not on file   Number of children: 2   Years of education: 0   Highest education level: Not on file  Occupational History   Occupation: ENVIRONMENTAL SERVICES    Employer: [employer]  Tobacco Use   Smoking status: Every Day    Packs/day: 0.50    Years: 37.00    Additional pack years: 0.00    Total pack years: 18.50    Types: Cigarettes   Smokeless tobacco: Never  Vaping Use   Vaping Use: Never used  Substance and Sexual Activity   Alcohol use: No    Alcohol/week: 0.0 standard drinks of alcohol   Drug use: No   Sexual activity: Not Currently  Other Topics  Concern   Not on file  Social History Narrative   Lives with husband, daughter & her daughter's boyfriend & her grandchildren.   Illiterate in any language.   Less than one year of formal education in [place].    Work: [employer]   From: [place] but speaks English okay    Social Determinants of Radio broadcast assistant Strain: Not on file  Food Insecurity: No Food Insecurity (10/01/2022)   Hunger Vital Sign    Worried About Running Out of Food in the Last Year: Never true    [place] in the Last Year: Never true  Transportation Needs: No Transportation Needs (10/01/2022)   PRAPARE - Hydrologist (Medical): No    Lack of Transportation (Non-Medical): No  Physical Activity: Not on file  Stress: Not on file  Social Connections: Moderately Integrated (10/01/2022)   Social Connection and Isolation Panel [NHANES]    Frequency of Communication with Friends and Family: More than three times a week    Frequency of Social Gatherings with Friends and Family: More than three times a week    Attends Religious Services: More than 4 times per year    Active Member of [employer] or Organizations: No    Attends Archivist Meetings: Never    Marital Status: Married  Human resources officer  Violence: Not At Risk (10/01/2022)   Humiliation, Afraid, Rape, and Kick questionnaire    Fear of Current or Ex-Partner: No    Emotionally Abused: No    Physically Abused: No    Sexually Abused: No    PHYSICAL EXAM  GENERAL EXAM/CONSTITUTIONAL: Vitals:  Vitals:   02/16/23 1117  BP: (!) 149/89  Pulse: 71  Weight: 75 lb 8 oz (34.2 kg)  Height: 4\' 4"  (1.321 m)   Body mass index is 19.63 kg/m. Wt Readings from Last 3 Encounters:  02/16/23 75 lb 8 oz (34.2 kg)  10/01/22 74 lb 8 oz (33.8 kg)  09/01/22 75 lb (34 kg)   Patient is in no distress; well developed, nourished and groomed; neck is supple, thin woman   EYES: Visual fields full to confrontation, Extraocular movements intacts,   MUSCULOSKELETAL: Gait, strength, tone, movements noted in Neurologic exam below  NEUROLOGIC: MENTAL STATUS:     05/04/2022   10:02 AM  MMSE - Mini Mental State Exam  Orientation to time 1  Orientation to Place 3  Registration 3  Attention/ Calculation 0  Recall 0  Language- name 2 objects 2  Language- repeat 0  Language- follow 3 step command 3  Language- read & follow direction 0  Language-read & follow direction-comments pt is illiterate in any language  Write a sentence 0  Write a sentence-comments pt is illiterate in any language  Copy design 0  Total score 12    CRANIAL NERVE:  2nd, 3rd, 4th, 6th -visual fields full to confrontation, extraocular muscles intact, no nystagmus 5th - facial sensation symmetric 7th - facial strength symmetric 8th - hearing intact 9th - palate elevates symmetrically, uvula midline 11th - shoulder shrug symmetric 12th - tongue protrusion midline  MOTOR:  normal bulk and tone, full strength in the BUE, BLE  SENSORY:  normal and symmetric to light touch, vibration  COORDINATION:  finger-nose-finger, fine finger movements normal  GAIT/STATION:  normal   DIAGNOSTIC DATA (LABS, IMAGING, TESTING) - I reviewed patient records, labs, notes,  testing and imaging myself where available.  Lab Results  Component Value  Date   WBC 5.1 03/25/2022   HGB 9.7 (L) 03/25/2022   HCT 31.2 (L) 03/25/2022   MCV 70.9 (L) 03/25/2022   PLT 245 03/25/2022      Component Value Date/Time   NA 140 10/01/2022 1114   K 3.4 (L) 10/01/2022 1114   CL 97 10/01/2022 1114   CO2 29 10/01/2022 1114   GLUCOSE 87 10/01/2022 1114   GLUCOSE 84 03/25/2022 1137   BUN 6 (L) 10/01/2022 1114   CREATININE 0.58 10/01/2022 1114   CREATININE 0.57 12/10/2013 1103   CALCIUM 9.4 10/01/2022 1114   PROT 6.8 03/25/2022 1137   PROT 6.8 11/13/2020 1656   ALBUMIN 4.1 03/25/2022 1137   ALBUMIN 4.4 11/13/2020 1656   AST 14 (L) 03/25/2022 1137   ALT 8 03/25/2022 1137   ALKPHOS 91 03/25/2022 1137   BILITOT 0.5 03/25/2022 1137   BILITOT 0.3 11/13/2020 1656   GFRNONAA >60 03/25/2022 1137   GFRAA 114 11/13/2020 1656   Lab Results  Component Value Date   CHOL 170 01/29/2022   HDL 53 01/29/2022   LDLCALC 97 01/29/2022   TRIG 110 01/29/2022   CHOLHDL 3.2 01/29/2022   Lab Results  Component Value Date   HGBA1C 5.2 01/29/2022   HGBA1C 5.2 01/29/2022   HGBA1C 5.2 (A) 01/29/2022   HGBA1C 5.2 01/29/2022   Lab Results  Component Value Date   [id number] 05/04/2022   Lab Results  Component Value Date   TSH 0.911 05/04/2022    ASSESSMENT AND PLAN  62 y.o. year old female with history of small bowel obstruction and mild dementia who is presenting for follow up.  At last visit, we started patient on Aricept but unfortunately she last lost her insurance and could not get the medication.  Family reports that she is stable.  Plan for today will be to get the ATN profile to look for Alzheimer disease biomarker and I also provided patient and family resources on GoodRx and how to get the medication \\cheaper .  I also gave them the application for the [facility] health financial assistance.  I will see them in 1 year for follow-up or sooner if worse.   1. Mild dementia  without behavioral disturbance, psychotic disturbance, mood disturbance, or anxiety, unspecified dementia type ([place])   2. Hypertension, unspecified type      Patient Instructions  Restart Aricept 5 mg nightly, discussed side effect including diarrhea, dizziness, vivid dreams Restart amlodipine 5 mg for hypertension ATN profile to look for Alzheimer disease biomarker Follow-up in 1 year or sooner if worse.  Orders Placed This Encounter  Procedures   ATN PROFILE    Meds ordered this encounter  Medications   amLODipine (NORVASC) 5 MG tablet    Sig: Take 1 tablet (5 mg total) by mouth daily.    Dispense:  90 tablet    Refill:  1    [facility] patient   donepezil (ARICEPT) 5 MG tablet    Sig: Take 1 tablet (5 mg total) by mouth at bedtime.    Dispense:  30 tablet    Refill:  11    Return in about 1 year (around 02/16/2024).   Alric Ran, MD 02/16/2023, 12:48 PM  [facility] [address], [place], [place] [address] [phone]

## 2023-02-16 NOTE — Patient Instructions (Signed)
Restart Aricept 5 mg nightly, discussed side effect including diarrhea, dizziness, vivid dreams Restart amlodipine 5 mg for hypertension ATN profile to look for Alzheimer disease biomarker Follow-up in 1 year or sooner if worse.

## 2023-02-18 ENCOUNTER — Telehealth: Payer: Self-pay | Admitting: Neurology

## 2023-02-18 NOTE — Telephone Encounter (Signed)
Pt's daughter is asking for a call from RN re: what pharmacy will pt's medications be called into.  She is asking that they be called into  [facility]

## 2023-02-19 LAB — ATN PROFILE
A -- Beta-amyloid 42/40 Ratio: 0.13 (ref >0.102)
Beta-amyloid 40: 167.2 pg/mL
Beta-amyloid 42: 21.78 pg/mL
N -- NfL, Plasma: 3.43 pg/mL (ref 0.00–4.61)
T -- p-tau181: 0.57 pg/mL (ref 0.00–0.97)

## 2023-02-21 ENCOUNTER — Other Ambulatory Visit: Payer: Self-pay

## 2023-02-21 ENCOUNTER — Other Ambulatory Visit: Payer: Self-pay | Admitting: *Deleted

## 2023-02-21 ENCOUNTER — Other Ambulatory Visit (HOSPITAL_COMMUNITY): Payer: Self-pay

## 2023-02-21 DIAGNOSIS — I10 Essential (primary) hypertension: Secondary | ICD-10-CM

## 2023-02-21 MED ORDER — DONEPEZIL HCL 5 MG PO TABS
5.0000 mg | ORAL_TABLET | Freq: Every day | ORAL | 11 refills | Status: DC
  Filled 2023-02-21: qty 30, 30d supply, fill #0

## 2023-02-21 MED ORDER — AMLODIPINE BESYLATE 5 MG PO TABS
5.0000 mg | ORAL_TABLET | Freq: Every day | ORAL | 1 refills | Status: DC
  Filled 2023-02-21: qty 90, 90d supply, fill #0

## 2023-02-21 MED ORDER — DONEPEZIL HCL 5 MG PO TABS
5.0000 mg | ORAL_TABLET | Freq: Every day | ORAL | 11 refills | Status: DC
Start: 2023-02-21 — End: 2023-02-21

## 2023-02-21 NOTE — Telephone Encounter (Signed)
Called pt and spoke to daughter and she stated that she would like it called in to the [facility] so that she can pick it up after work.

## 2023-02-22 ENCOUNTER — Other Ambulatory Visit (HOSPITAL_COMMUNITY): Payer: Self-pay

## 2023-03-29 ENCOUNTER — Ambulatory Visit: Payer: Self-pay | Admitting: Student

## 2023-03-29 ENCOUNTER — Other Ambulatory Visit (HOSPITAL_COMMUNITY): Payer: Self-pay

## 2023-03-29 ENCOUNTER — Encounter: Payer: Self-pay | Admitting: Student

## 2023-03-29 VITALS — BP 138/89 | HR 75 | Temp 98.2°F | Ht <= 58 in | Wt 71.8 lb

## 2023-03-29 DIAGNOSIS — K2950 Unspecified chronic gastritis without bleeding: Secondary | ICD-10-CM

## 2023-03-29 DIAGNOSIS — R1012 Left upper quadrant pain: Secondary | ICD-10-CM

## 2023-03-29 DIAGNOSIS — I10 Essential (primary) hypertension: Secondary | ICD-10-CM

## 2023-03-29 MED ORDER — PANTOPRAZOLE SODIUM 40 MG PO TBEC
40.0000 mg | DELAYED_RELEASE_TABLET | Freq: Every day | ORAL | 2 refills | Status: DC
  Filled 2023-03-29: qty 30, 30d supply, fill #0

## 2023-03-29 NOTE — Patient Instructions (Addendum)
Thank you, [name] for allowing [place] to provide your care today. Today we discussed blood pressure.  Blood Pressure -continue Amlodipine 5 mg one time a day  -I will work on the letter for your citizenship.  -Please reach out about your orange card application.     I have ordered the following medication/changed the following medications:   Stop the following medications: There are no discontinued medications.   Start the following medications: Meds ordered this encounter  Medications   pantoprazole (PROTONIX) 40 MG tablet    Sig: Take 1 tablet (40 mg total) by mouth daily.    Dispense:  30 tablet    Refill:  2    IM program     Follow up: 4-6 months    Should you have any questions or concerns please call the internal medicine clinic at [phone].    Rana Snare, D.O. [facility]

## 2023-03-29 NOTE — Assessment & Plan Note (Signed)
BP: 138/89 BP well-controlled today on amlodipine 5 mg daily. Tolerating well and reports adherence and no acute concerns at this time.    Plan -continue amlodipine 5 mg daily

## 2023-03-29 NOTE — Progress Notes (Signed)
CC: HTN f/u, letter for citizenship  HPI:  [name] [name] is a 62 y.o. female living with a history stated below and presents today for follow up for HTN and letter for citizenship. Please see problem based assessment and plan for additional details.  In person interpreter used during this encounter. Son was present during visit.   Past Medical History:  Diagnosis Date   Anemia 07/27/2016   Annual physical exam 06/27/2020   Atypical chest pain 08/07/2020   Clotting disorder ([facility])    Colostomy stricture ([facility]) 07/02/2015   Constipation    Cough 08/07/2020   Depression    Diarrhea 11/13/2020   Acute watery diarrhea starting 12/14 associated with abdominal pain, headache, feeling "hot and cold".    Difficulty sleeping    Encounter for completion of form with patient 10/23/2021   Headache 07/18/2018   Hypokalemia 06/27/2020   Influenza vaccine administered 08/06/2021   Literacy level of illiterate 10/01/2016   Less than one year of formal education in [place].  [name] is taking classes for reading and writing in [place].    S/P colostomy [facility]) 03/02/2015   March 2016: colon perforation following screening colonoscopy    S/P colostomy [facility]) 03/02/2015   March 2016: colon perforation following screening colonoscopy    SBO (small bowel obstruction) ([facility]) 03/18/2021    Current Outpatient Medications on File Prior to Visit  Medication Sig Dispense Refill   acetaminophen (TYLENOL) 500 MG tablet Take 1,000 mg by mouth every 6 (six) hours as needed for headache or moderate pain.     amLODipine (NORVASC) 5 MG tablet Take 1 tablet (5 mg total) by mouth daily. 90 tablet 1   donepezil (ARICEPT) 5 MG tablet Take 1 tablet (5 mg total) by mouth at bedtime. 30 tablet 11   No current facility-administered medications on file prior to visit.   Review of Systems: ROS negative except for what is noted on the assessment and plan.  Vitals:   03/29/23 1003  BP: 138/89  Pulse: 75  Temp: 98.2 F (36.8  C)  TempSrc: Oral  SpO2: 99%  Weight: 71 lb 12.8 oz (32.6 kg)  Height: 4' (1.219 m)   Physical Exam: Constitutional: well-appearing female sitting in chair comfortably, in no acute distress HENT: normocephalic atraumatic Neck: supple Cardiovascular: regular rate and rhythm Pulmonary/Chest: normal work of breathing on room air Abdominal: soft, non-tender, non-distended, no guarding  MSK: normal bulk and tone Neurological: awake and alert Skin: warm and dry Psych: pleasant mood  Assessment & Plan:   HTN (hypertension) BP: 138/89 BP well-controlled today on amlodipine 5 mg daily. Tolerating well and reports adherence and no acute concerns at this time.    Plan -continue amlodipine 5 mg daily  Abdominal pain No acute concerns today. Abdominal exam was soft, non-tender and non-distended. Hx of constipation which is managed with Miralax and having regular BM. Awaiting approval for orange card/financial assistance. Son is aware and will check on status after today's visit. Plan is for patient to re-establish with GI.   Plan -refill Protonix 40 mg daily -continue Miralax for constipation  -await decision on orange card/financial assistance  Patient also requested letter from PCP in anticipation for her citizenship. She has hx of dementia which she is following with [facility]. She has decline in her memory over past few years and being forgetful. She and her son is concerned given her dementia and patient's ability to answer/recall questions asked during the citizenship exam.    Patient  discussed with Dr. Halina Andreas, D.O. [facility] Internal [facility], PGY-1 Phone: [phone] Date 03/29/2023 Time 1:06 PM

## 2023-03-29 NOTE — Assessment & Plan Note (Signed)
No acute concerns today. Abdominal exam was soft, non-tender and non-distended. Hx of constipation which is managed with Miralax and having regular BM. Awaiting approval for orange card/financial assistance. Son is aware and will check on status after today's visit. Plan is for patient to re-establish with GI.   Plan -refill Protonix 40 mg daily -continue Miralax for constipation  -await decision on orange card/financial assistance

## 2023-04-05 NOTE — Progress Notes (Signed)
Internal Medicine Clinic Attending  Case discussed with Dr. Zheng  At the time of the visit.  We reviewed the resident's history and exam and pertinent patient test results.  I agree with the assessment, diagnosis, and plan of care documented in the resident's note.  

## 2023-05-27 ENCOUNTER — Encounter (HOSPITAL_COMMUNITY): Payer: Self-pay | Admitting: Emergency Medicine

## 2023-05-27 ENCOUNTER — Ambulatory Visit (HOSPITAL_COMMUNITY)
Admission: EM | Admit: 2023-05-27 | Discharge: 2023-05-27 | Disposition: A | Discharge status: Home / Self Care | Payer: Medicaid Other | Source: Home / Self Care | Attending: Family Medicine | Admitting: Family Medicine

## 2023-05-27 ENCOUNTER — Ambulatory Visit (INDEPENDENT_AMBULATORY_CARE_PROVIDER_SITE_OTHER): Payer: Medicaid Other

## 2023-05-27 DIAGNOSIS — R051 Acute cough: Secondary | ICD-10-CM

## 2023-05-27 DIAGNOSIS — Z1152 Encounter for screening for COVID-19: Secondary | ICD-10-CM | POA: Diagnosis not present

## 2023-05-27 DIAGNOSIS — R0781 Pleurodynia: Secondary | ICD-10-CM | POA: Diagnosis not present

## 2023-05-27 DIAGNOSIS — J069 Acute upper respiratory infection, unspecified: Secondary | ICD-10-CM | POA: Diagnosis present

## 2023-05-27 LAB — SARS CORONAVIRUS 2 (TAT 6-24 HRS): SARS Coronavirus 2: NEGATIVE

## 2023-05-27 MED ORDER — BENZONATATE 100 MG PO CAPS: 100.0000 mg | ORAL_CAPSULE | Freq: Three times a day (TID) | ORAL | 0 refills | Status: DC | PRN

## 2023-05-27 NOTE — ED Triage Notes (Signed)
Pt c/o right sided chest pains since yesterday. Pain when moving and coughing. Cough is dry. Hasn't taken any medications for pain.

## 2023-05-27 NOTE — Discharge Instructions (Addendum)
The EKG did not show any thing serious.  The chest x-ray did not show any fluid or pneumonia.  Take benzonatate 100 mg, 1 tab every 8 hours as needed for cough.  You can take Tylenol 325 mg--2 tablets every 6 hours as needed for pain   You have been swabbed for COVID, and the test will result in the next 24 hours. Our staff will call you if positive. If the COVID test is positive, you should quarantine until you are fever free for 24 hours and you are starting to feel better, and then take added precautions for the next 5 days, such as physical distancing/wearing a mask and good hand hygiene/washing.

## 2023-05-27 NOTE — ED Provider Notes (Signed)
[facility]    CSN: [id number] Arrival date & time: 05/27/23  1331      History   Chief Complaint Chief Complaint  Patient presents with   Chest Pain   Cough    HPI [name] [name] is a 62 y.o. female.    Chest Pain Associated symptoms: cough   Cough Associated symptoms: chest pain    Here for cough and right-sided chest pain.  Symptoms began since yesterday.  No fever or chills.  No congestion or rhinorrhea.  No vomiting or diarrhea.  Movements are coughing makes it hurt.    Past Medical History:  Diagnosis Date   Anemia 07/27/2016   Annual physical exam 06/27/2020   Atypical chest pain 08/07/2020   Clotting disorder (HCC)    Colostomy stricture (HCC) 07/02/2015   Constipation    Cough 08/07/2020   Depression    Diarrhea 11/13/2020   Acute watery diarrhea starting 12/14 associated with abdominal pain, headache, feeling "hot and cold".    Difficulty sleeping    Encounter for completion of form with patient 10/23/2021   Headache 07/18/2018   Hypokalemia 06/27/2020   Influenza vaccine administered 08/06/2021   Literacy level of illiterate 10/01/2016   Less than one year of formal education in [place].  Ms Paller is taking classes for reading and writing in [place].    S/P colostomy [facility]) 03/02/2015   March 2016: colon perforation following screening colonoscopy    S/P colostomy [facility]) 03/02/2015   March 2016: colon perforation following screening colonoscopy    SBO (small bowel obstruction) (HCC) 03/18/2021    Patient Active Problem List   Diagnosis Date Noted   HTN (hypertension) 10/05/2021   Peptic duodenitis 09/14/2021   Chronic gastritis 09/14/2021   Language barrier affecting health care 08/07/2020   Literacy level of illiterate 10/01/2016   Reduced visual acuity 09/30/2016   Healthcare maintenance 07/27/2016   Abdominal pain 01/15/2016    Past Surgical History:  Procedure Laterality Date   ABDOMINAL HYSTERECTOMY  1994   APPENDECTOMY  1993    CESAREAN SECTION  1992   COLONOSCOPY N/A 05/30/2015   Procedure: COLONOSCOPY;  Surgeon: Romie Levee, MD;  Location: [facility];  Service: Endoscopy;  Laterality: N/A;   COLONOSCOPY WITH PROPOFOL N/A 06/12/2015   Procedure: COLONOSCOPY WITH PROPOFOL;  Surgeon: Romie Levee, MD;  Location: [facility];  Service: Endoscopy;  Laterality: N/A;   COLOSTOMY  01/2015   COLOSTOMY TAKEDOWN N/A 07/02/2015   Procedure: LAPAROSCOPIC COLOSTOMY REVERSAL SPLENIC FLEXURE MOBILIZATION AND PARTIAL COLECTOMY;  Surgeon: Romie Levee, MD;  Location: [facility];  Service: General;  Laterality: N/A;   DILATION AND CURETTAGE OF UTERUS  1990   "miscarriage"   LAPAROTOMY N/A 02/20/2015   Procedure: PARTIAL COLECTOMY END  ILEOSTOMY AND HARTMAN'S PROCEDURE;  Surgeon: Romie Levee, MD;  Location: [facility];  Service: General;  Laterality: N/A;    OB History   No obstetric history on file.      Home Medications    Prior to Admission medications   Medication Sig Start Date End Date Taking? Authorizing Provider  benzonatate (TESSALON) 100 MG capsule Take 1 capsule (100 mg total) by mouth 3 (three) times daily as needed for cough. 05/27/23  Yes Zenia Resides, MD  acetaminophen (TYLENOL) 500 MG tablet Take 1,000 mg by mouth every 6 (six) hours as needed for headache or moderate pain.    [provider]  amLODipine (NORVASC) 5 MG tablet Take 1 tablet (5 mg total) by mouth daily. 02/21/23  08/20/23  Windell Norfolk, MD  donepezil (ARICEPT) 5 MG tablet Take 1 tablet (5 mg total) by mouth at bedtime. 02/21/23   Windell Norfolk, MD  pantoprazole (PROTONIX) 40 MG tablet Take 1 tablet (40 mg total) by mouth daily. 03/29/23   Rana Snare, DO    Family History Family History  Problem Relation Age of Onset   Colon cancer Neg Hx    Rectal cancer Neg Hx    Stomach cancer Neg Hx     Social History Social History   Tobacco Use   Smoking status: Every Day    Packs/day: 0.50    Years: 37.00    Additional pack years: 0.00     Total pack years: 18.50    Types: Cigarettes   Smokeless tobacco: Never  Vaping Use   Vaping Use: Never used  Substance Use Topics   Alcohol use: No    Alcohol/week: 0.0 standard drinks of alcohol   Drug use: No     Allergies   Patient has no known allergies.   Review of Systems Review of Systems  Respiratory:  Positive for cough.   Cardiovascular:  Positive for chest pain.     Physical Exam Triage Vital Signs ED Triage Vitals  Enc Vitals Group     BP 05/27/23 1413 (!) 149/79     Pulse Rate 05/27/23 1413 72     Resp 05/27/23 1413 17     Temp 05/27/23 1413 98.4 F (36.9 C)     Temp Source 05/27/23 1413 Oral     SpO2 05/27/23 1413 97 %     Weight --      Height --      Head Circumference --      Peak Flow --      Pain Score 05/27/23 1411 9     Pain Loc --      Pain Edu? --      Excl. in GC? --    No data found.  Updated Vital Signs BP (!) 149/79 (BP Location: Right Arm)   Pulse 72   Temp 98.4 F (36.9 C) (Oral)   Resp 17   SpO2 97%   Visual Acuity Right Eye Distance:   Left Eye Distance:   Bilateral Distance:    Right Eye Near:   Left Eye Near:    Bilateral Near:     Physical Exam Vitals reviewed.  Constitutional:      General: She is not in acute distress.    Appearance: She is not ill-appearing, toxic-appearing or diaphoretic.  HENT:     Nose: Nose normal.     Mouth/Throat:     Mouth: Mucous membranes are moist.     Pharynx: No oropharyngeal exudate or posterior oropharyngeal erythema.  Eyes:     Extraocular Movements: Extraocular movements intact.     Conjunctiva/sclera: Conjunctivae normal.     Pupils: Pupils are equal, round, and reactive to light.  Cardiovascular:     Rate and Rhythm: Normal rate and regular rhythm.     Heart sounds: No murmur heard. Pulmonary:     Effort: Pulmonary effort is normal. No respiratory distress.     Breath sounds: Normal breath sounds. No stridor. No wheezing, rhonchi or rales.  Chest:     Chest  wall: Tenderness (Right anterior chest in the mid axillary line) present.  Musculoskeletal:     Cervical back: Neck supple.  Lymphadenopathy:     Cervical: No cervical adenopathy.  Skin:    Coloration: Skin  is not jaundiced or pale.  Neurological:     General: No focal deficit present.     Mental Status: She is alert and oriented to person, place, and time.  Psychiatric:        Behavior: Behavior normal.      UC Treatments / Results  Labs (all labs ordered are listed, but only abnormal results are displayed) Labs Reviewed  SARS CORONAVIRUS 2 (TAT 6-24 HRS)    EKG   Radiology DG Chest 2 View  Result Date: 05/27/2023 CLINICAL DATA:  Cough EXAM: CHEST - 2 VIEW COMPARISON:  Chest x-ray dated Mar 25, 2022 FINDINGS: Leftward patient rotation somewhat limits evaluation. Cardiac and mediastinal contours are within normal limits. Unchanged biapical pleural-parenchymal scarring. Nodular opacity of the lower left lung, consistent with a nipple shadow. No pleural effusion or pneumothorax. IMPRESSION: No active cardiopulmonary disease. Electronically Signed   By: Allegra Lai M.D.   On: 05/27/2023 15:14    Procedures Procedures (including critical care time)  Medications Ordered in UC Medications - No data to display  Initial Impression / Assessment and Plan / UC Course  I have reviewed the triage vital signs and the nursing notes.  Pertinent labs & imaging results that were available during my care of the patient were reviewed by me and considered in my medical decision making (see chart for details).        EKG is done and shows normal sinus rhythm without any ST changes.   chest x-ray is done and does not show any fluid or infiltrate.  Tessalon Perles are sent in for the cough Tylenol is recommended for the pain   COVID swab was done and if positive she is a candidate for Paxlovid.  Her last EGFR was 104 in November 2023. Final Clinical Impressions(s) / UC Diagnoses    Final diagnoses:  Viral upper respiratory tract infection  Acute cough  Pleuritic chest pain     Discharge Instructions      The EKG did not show any thing serious.  The chest x-ray did not show any fluid or pneumonia.  Take benzonatate 100 mg, 1 tab every 8 hours as needed for cough.  You can take Tylenol 325 mg--2 tablets every 6 hours as needed for pain   You have been swabbed for COVID, and the test will result in the next 24 hours. Our staff will call you if positive. If the COVID test is positive, you should quarantine until you are fever free for 24 hours and you are starting to feel better, and then take added precautions for the next 5 days, such as physical distancing/wearing a mask and good hand hygiene/washing.      ED Prescriptions     Medication Sig Dispense Auth. Provider   benzonatate (TESSALON) 100 MG capsule Take 1 capsule (100 mg total) by mouth 3 (three) times daily as needed for cough. 21 capsule Zenia Resides, MD      PDMP not reviewed this encounter.   Zenia Resides, MD 05/27/23 [id number]

## 2023-08-23 IMAGING — CT CT HEAD W/O CM
4 series · 16 of 47 positions shown, 18 images · non-contrast
Comparison: None Available.

CLINICAL DATA: Provided history: Mental status change, unknown
cause. Memory loss.



[Series 2: head wo · axial · 0.41mm/px · z∈[+1017,+1117]mm · 7 of 28 slices shown, 9 images]
[im 4/28  brain]
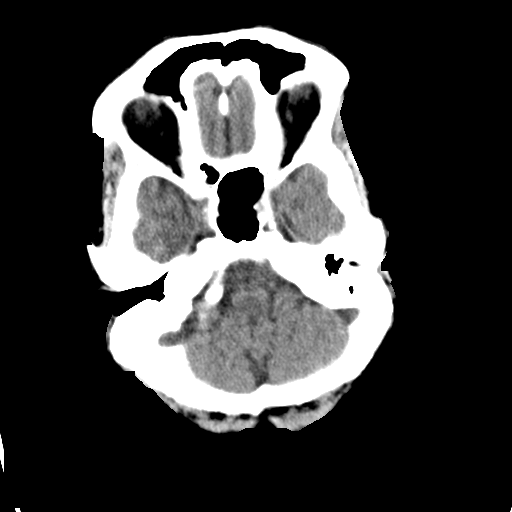
[im 4/28  bone]
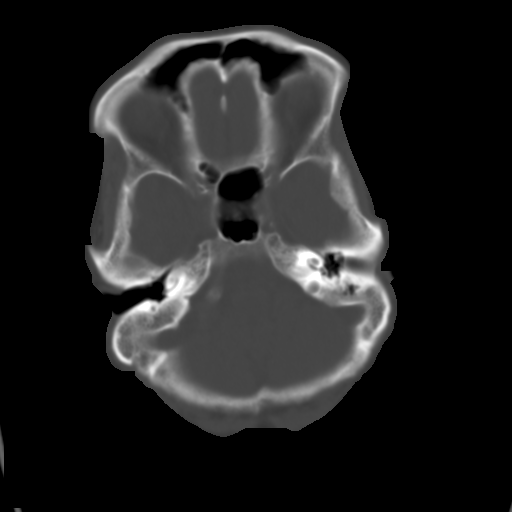
[im 7/28  brain]
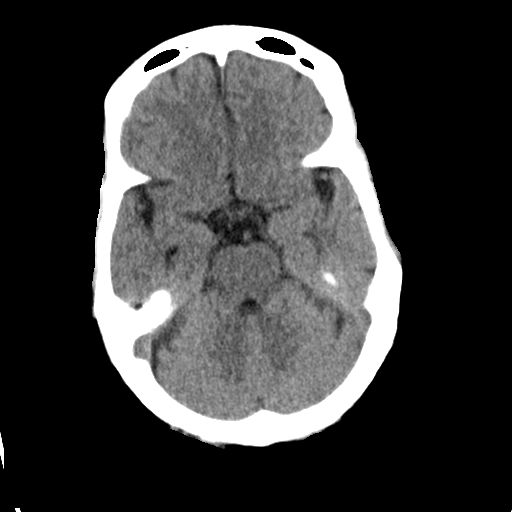
[im 11/28  brain]
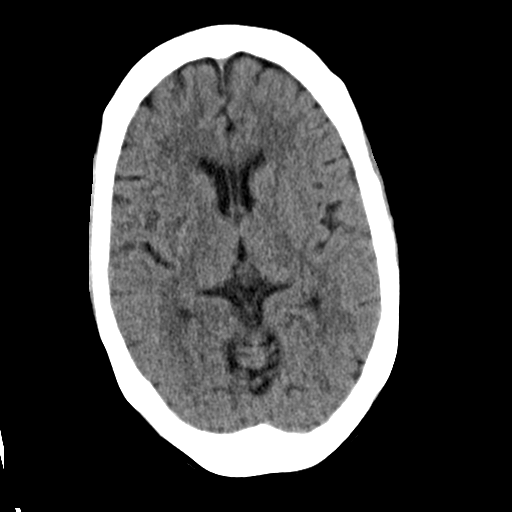
[im 14/28  brain]
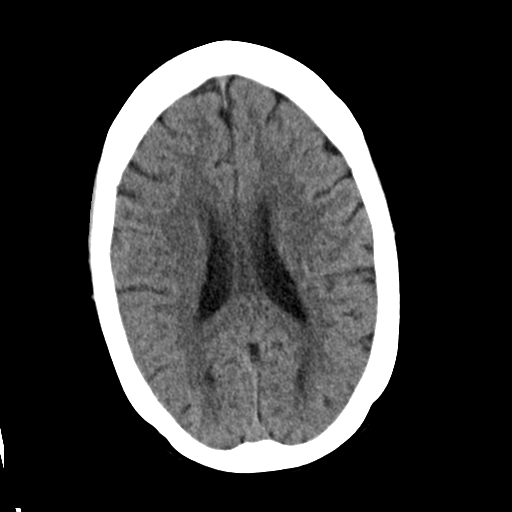
[im 17/28  brain]
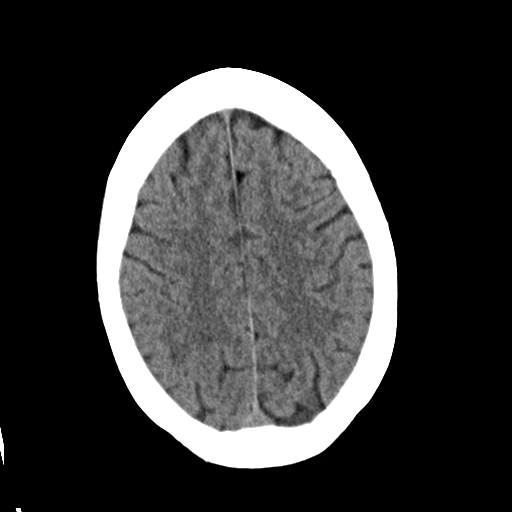
[im 17/28  bone]
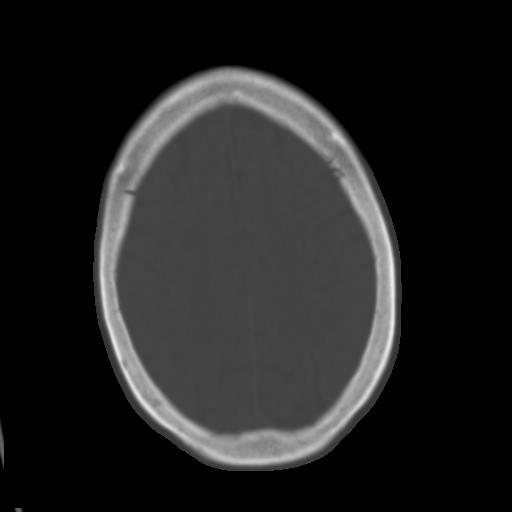
[im 21/28  brain]
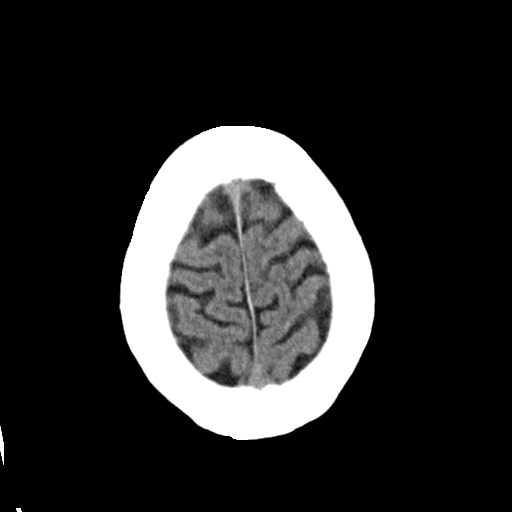
[im 24/28  brain]
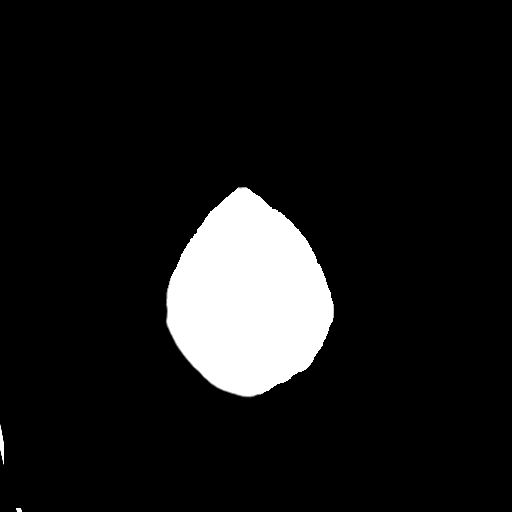

[Series 3: head bone · axial · 0.41mm/px · z∈[+1014,+1042]mm · 3 of 69 slices shown]
[im 7/69  bone]
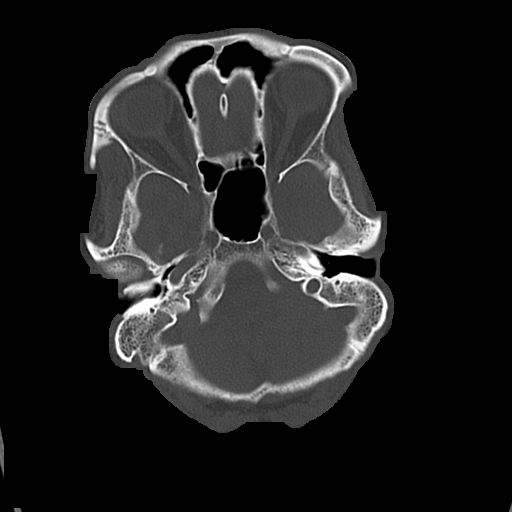
[im 14/69  bone]
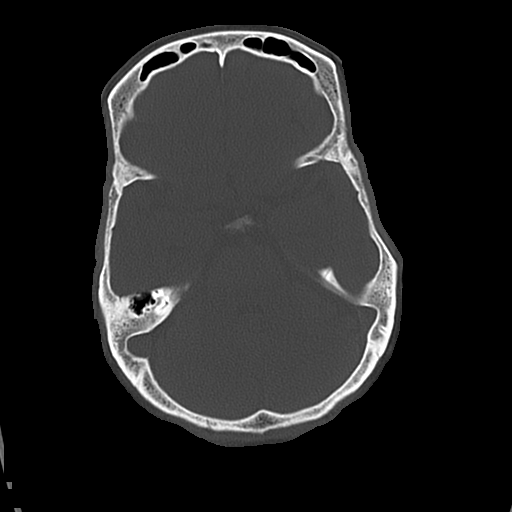
[im 21/69  bone]
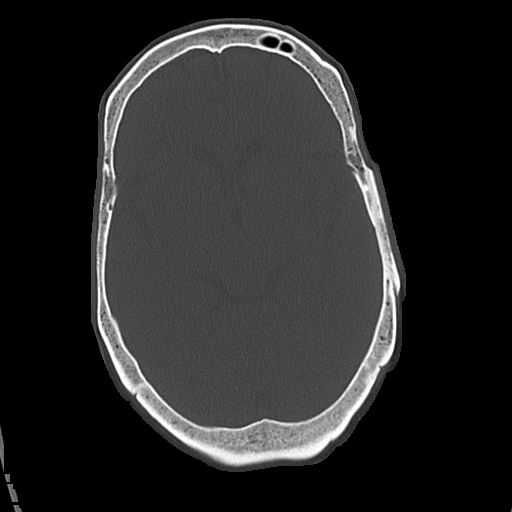

[Series 4: coronal soft · coronal · 0.29mm/px · 3 of 66 slices shown]
[im 22/66  brain]
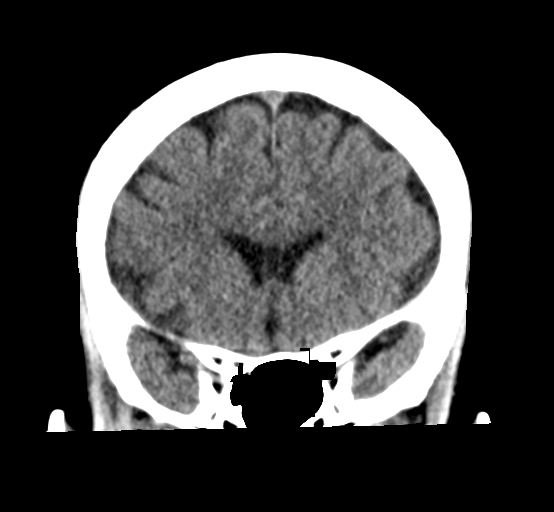
[im 29/66  brain]
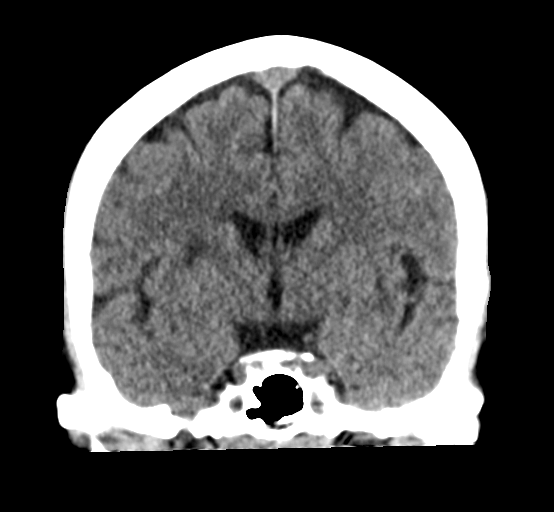
[im 37/66  brain]
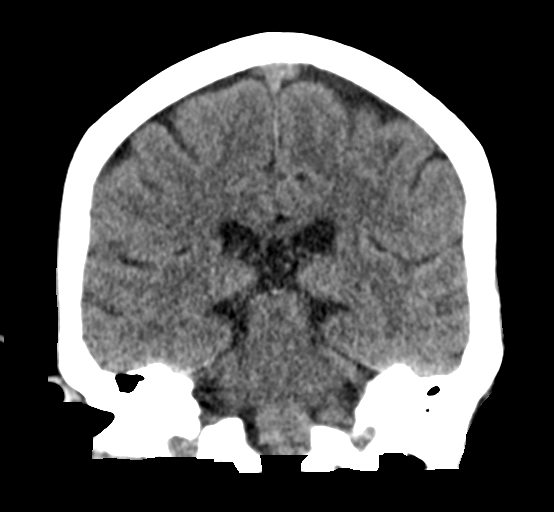

[Series 5: sagittal soft · sagittal · 0.29mm/px · 3 of 53 slices shown]
[im 18/53  brain]
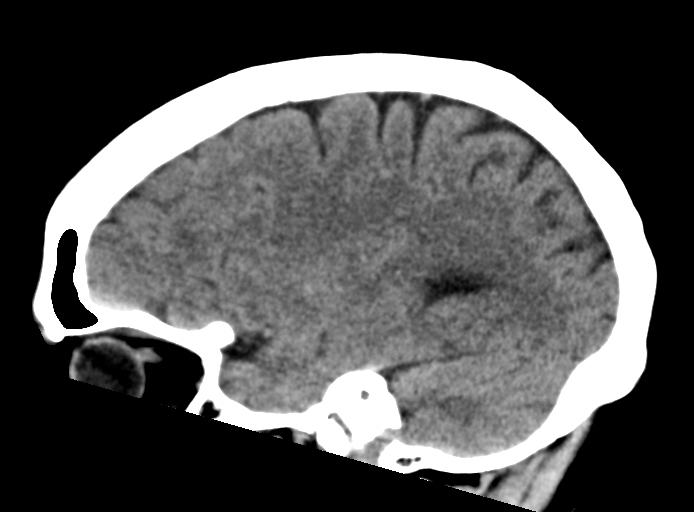
[im 27/53  brain]
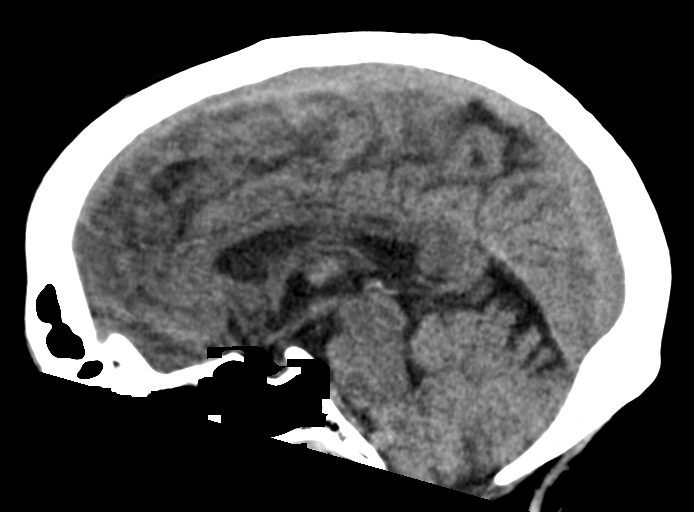
[im 35/53  brain]
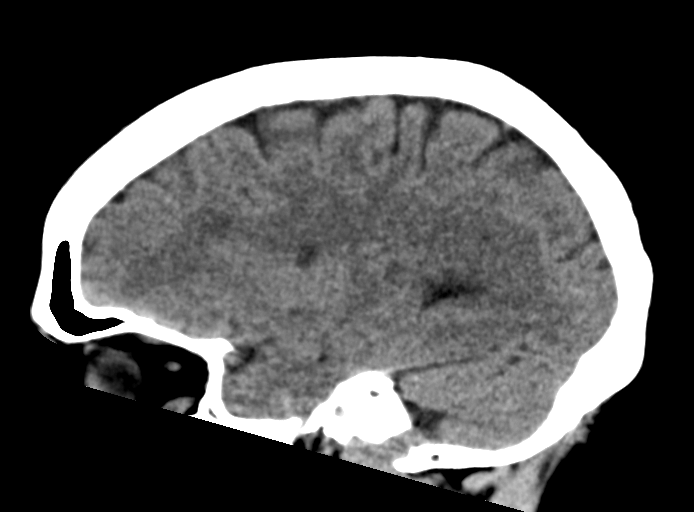

[16 of 47 positions shown; findings below may reference images not displayed]

FINDINGS: Brain:

No age-advanced or lobar predominant parenchymal atrophy.

Chronic appearing small-vessel infarcts within the bilateral corona
radiata/basal ganglia.

There is no acute intracranial hemorrhage.

No demarcated cortical infarct.

No extra-axial fluid collection.

No evidence of an intracranial mass.

No midline shift.

Vascular: No hyperdense vessel. Atherosclerotic calcifications.

Skull: Normal. Negative for fracture or focal lesion.

Sinuses/Orbits: No mass or acute finding within the imaged orbits.
Trace mucosal thickening within the bilateral ethmoid sinuses at the
imaged levels.
IMPRESSION: No evidence of acute intracranial abnormality.

Chronic appearing small-vessel infarcts within the bilateral corona
radiata/basal ganglia.

No age-advanced or lobar predominant parenchymal atrophy.

## 2023-09-09 ENCOUNTER — Ambulatory Visit: Payer: Medicaid Other | Admitting: Student

## 2023-09-09 ENCOUNTER — Other Ambulatory Visit: Payer: Self-pay

## 2023-09-09 ENCOUNTER — Encounter: Payer: Self-pay | Admitting: Student

## 2023-09-09 VITALS — BP 135/72 | HR 74 | Temp 98.3°F | Ht <= 58 in | Wt 73.7 lb

## 2023-09-09 DIAGNOSIS — N3941 Urge incontinence: Secondary | ICD-10-CM

## 2023-09-09 DIAGNOSIS — F17210 Nicotine dependence, cigarettes, uncomplicated: Secondary | ICD-10-CM | POA: Diagnosis not present

## 2023-09-09 DIAGNOSIS — I10 Essential (primary) hypertension: Secondary | ICD-10-CM | POA: Diagnosis not present

## 2023-09-09 DIAGNOSIS — R1032 Left lower quadrant pain: Secondary | ICD-10-CM

## 2023-09-09 MED ORDER — AMLODIPINE BESYLATE 5 MG PO TABS
5.0000 mg | ORAL_TABLET | Freq: Every day | ORAL | 1 refills | Status: AC
Start: 2023-09-09 — End: 2024-03-07

## 2023-09-09 MED ORDER — BENZONATATE 100 MG PO CAPS: 100.0000 mg | ORAL_CAPSULE | Freq: Three times a day (TID) | ORAL | 0 refills | Status: DC | PRN

## 2023-09-09 NOTE — Progress Notes (Signed)
Established Patient Office Visit  Subjective   Patient ID: [name] [name], female    DOB: 04/14/1961  Age: 62 y.o. MRN: [id number]  Chief Complaint  Patient presents with   Follow-up   Hypertension   Abdominal Pain    PT STATED THAT SHE HAS STOMACH PAINS/ BURNING  MOSTLY AFTER EATING BUT SHE USUALLY WALKS AFTER EATING SO IT HELPS THE PAIN     HPI This is a 62 year old female living with a history stated below and presents today for blood pressure follow up. Please see problem based assessment and plan for additional details.    Patient Active Problem List   Diagnosis Date Noted   Urinary incontinence, urge 09/09/2023   HTN (hypertension) 10/05/2021   Language barrier affecting health care 08/07/2020   Literacy level of illiterate 10/01/2016   Reduced visual acuity 09/30/2016   Healthcare maintenance 07/27/2016   Abdominal pain 01/15/2016   Past Medical History:  Diagnosis Date   Anemia 07/27/2016   Annual physical exam 06/27/2020   Atypical chest pain 08/07/2020   Clotting disorder (HCC)    Colostomy stricture (HCC) 07/02/2015   Constipation    Cough 08/07/2020   Depression    Diarrhea 11/13/2020   Acute watery diarrhea starting 12/14 associated with abdominal pain, headache, feeling "hot and cold".    Difficulty sleeping    Encounter for completion of form with patient 10/23/2021   Headache 07/18/2018   Hypokalemia 06/27/2020   Influenza vaccine administered 08/06/2021   Literacy level of illiterate 10/01/2016   Less than one year of formal education in [place].  Ms Hurtado is taking classes for reading and writing in [place].    S/P colostomy [facility]) 03/02/2015   March 2016: colon perforation following screening colonoscopy    S/P colostomy [facility] 03/02/2015   March 2016: colon perforation following screening colonoscopy    SBO (small bowel obstruction) (HCC) 03/18/2021   Past Surgical History:  Procedure Laterality Date   ABDOMINAL HYSTERECTOMY  1994   APPENDECTOMY  1993    CESAREAN SECTION  1992   COLONOSCOPY N/A 05/30/2015   Procedure: COLONOSCOPY;  Surgeon: Romie Levee, MD;  [facility];  Service: Endoscopy;  Laterality: N/A;   COLONOSCOPY WITH PROPOFOL N/A 06/12/2015   Procedure: COLONOSCOPY WITH PROPOFOL;  Surgeon: Romie Levee, MD;  Location: [facility];  Service: Endoscopy;  Laterality: N/A;   COLOSTOMY  01/2015   COLOSTOMY TAKEDOWN N/A 07/02/2015   Procedure: LAPAROSCOPIC COLOSTOMY REVERSAL SPLENIC FLEXURE MOBILIZATION AND PARTIAL COLECTOMY;  Surgeon: Romie Levee, MD;  Location: [facility];  Service: General;  Laterality: N/A;   DILATION AND CURETTAGE OF UTERUS  1990   "miscarriage"   LAPAROTOMY N/A 02/20/2015   Procedure: PARTIAL COLECTOMY END  ILEOSTOMY AND HARTMAN'S PROCEDURE;  Surgeon: Romie Levee, MD;  Location: [facility];  Service: General;  Laterality: N/A;   Social History   Tobacco Use   Smoking status: Every Day    Current packs/day: 0.50    Average packs/day: 0.5 packs/day for 37.0 years (18.5 ttl pk-yrs)    Types: Cigarettes   Smokeless tobacco: Never  Vaping Use   Vaping status: Never Used  Substance Use Topics   Alcohol use: No    Alcohol/week: 0.0 standard drinks of alcohol   Drug use: No   Family Status  Relation Name Status   Mother  Deceased   Father  Deceased   Neg Hx  (Not Specified)  No partnership data on file   Family History  Problem Relation  Age of Onset   Colon cancer Neg Hx    Rectal cancer Neg Hx    Stomach cancer Neg Hx    No Known Allergies   Review of Systems  Constitutional:  Negative for chills and fever.  HENT:  Negative for congestion and sore throat.   Respiratory:  Positive for cough. Negative for sputum production.   Cardiovascular:  Negative for chest pain.  Gastrointestinal:  Positive for abdominal pain and constipation. Negative for blood in stool, diarrhea, heartburn, nausea and vomiting.  Genitourinary:  Negative for dysuria.  Neurological:  Negative for dizziness and headaches.       Objective:     BP 135/72 (BP Location: Left Arm, Patient Position: Sitting, Cuff Size: Small)   Pulse 74   Temp 98.3 F (36.8 C) (Oral)   Ht 4\' 9"  (1.448 m)   Wt 73 lb 11.2 oz (33.4 kg)   SpO2 100%   BMI 15.95 kg/m  BP Readings from Last 3 Encounters:  09/09/23 135/72  05/27/23 (!) 149/79  03/29/23 138/89   Wt Readings from Last 3 Encounters:  09/09/23 73 lb 11.2 oz (33.4 kg)  03/29/23 71 lb 12.8 oz (32.6 kg)  02/16/23 75 lb 8 oz (34.2 kg)   SpO2 Readings from Last 3 Encounters:  09/09/23 100%  05/27/23 97%  03/29/23 99%      Physical Exam  Constitutional: well-appearing  sitting in chair, in no acute distress HENT: normocephalic atraumatic, mucous membranes moist,  Cardiovascular: regular rate and rhythm, no m/r/g, no lower extremity edema bilaterally Pulmonary/Chest: normal work of breathing on room air, lungs clear to auscultation bilaterally Abdominal: soft, non-tender, non-distended, mild tenderness to palpation left abdomen MSK: normal bulk and tone Neurological: alert & oriented x 3, no focal deficit Skin: warm and dry Psych: normal mood and behavior   No results found for any visits on 09/09/23.  Last CBC Lab Results  Component Value Date   WBC 5.1 03/25/2022   HGB 9.7 (L) 03/25/2022   HCT 31.2 (L) 03/25/2022   MCV 70.9 (L) 03/25/2022   MCH 22.0 (L) 03/25/2022   RDW 14.7 03/25/2022   PLT 245 03/25/2022   Last metabolic panel Lab Results  Component Value Date   GLUCOSE 87 10/01/2022   NA 140 10/01/2022   K 3.4 (L) 10/01/2022   CL 97 10/01/2022   CO2 29 10/01/2022   BUN 6 (L) 10/01/2022   CREATININE 0.58 10/01/2022   EGFR 104 10/01/2022   CALCIUM 9.4 10/01/2022   PHOS 3.7 03/19/2021   PROT 6.8 03/25/2022   ALBUMIN 4.1 03/25/2022   LABGLOB 2.4 11/13/2020   AGRATIO 1.8 11/13/2020   BILITOT 0.5 03/25/2022   ALKPHOS 91 03/25/2022   AST 14 (L) 03/25/2022   ALT 8 03/25/2022   ANIONGAP 7 03/25/2022   Last lipids Lab Results   Component Value Date   CHOL 170 01/29/2022   HDL 53 01/29/2022   LDLCALC 97 01/29/2022   TRIG 110 01/29/2022   CHOLHDL 3.2 01/29/2022   Last hemoglobin A1c Lab Results  Component Value Date   HGBA1C 5.2 01/29/2022   HGBA1C 5.2 01/29/2022   HGBA1C 5.2 (A) 01/29/2022   HGBA1C 5.2 01/29/2022      The 10-year ASCVD risk score (Arnett DK, et al., 2019) is: 10%    Assessment & Plan:    Problem List Items Addressed This Visit       Cardiovascular and Mediastinum   HTN (hypertension)    BP Readings from Last 3  Encounters:  09/09/23 135/72  05/27/23 (!) 149/79  03/29/23 138/89   Patient is currently on amlodipine 5 mg daily.  Patient denies any headaches, vision changes, chest pain, shortness of breath, lower extremity swelling.  Patient denies any lightheadedness or dizziness.  Patient reports checks her blood pressure at home, however she does not remember the numbers at this time. Plan: -Continue amlodipine 5 mg daily -Please bring a blood pressure log with you at the next visit      Relevant Medications   amLODipine (NORVASC) 5 MG tablet     Other   Abdominal pain - Primary    Patient reports she has multiple abdominal surgeries, she had a ex laparotomy with Hartman's procedure in 2016 followed by lap colostomy reversal. EGD 08/2021 Erosive gastropathy with no bleeding and no stigmata of recent bleeding; pathology showed peptic duodenitis and mild chronic gastritis. Patient with history of constipation, that is managed with MiraLAX. She is having regular bowel movements.  She is currently on Protonix 40 mg daily.  Previous referral to gastroenterology was placed however she did not have an insurance, the referral was closed.  Patient reports that abdominal pain is mostly localized to left abdomen.  Plan: - Continue protonix 40 mg daily - Continue Dulcolax as needed for constipation. - Benzonante for cough as needed - Referral to GI is sent      Relevant Orders    Ambulatory referral to Gastroenterology   Urinary incontinence, urge    Patient reports urinary continence for about a year, she wears diapers.  Patient reports she has accidents where she is unable to make it to the bathroom.  Patient reports she has urges to go, however she is unable to make it to the bathroom, and she urinates on herself.  Patient denies any symptoms of dysuria or increase in frequency.  Patient denies incontinence with cough, physical activity.  Plan:  -Patient is counseled on timed voiding, to void every 2-3 hours daily -If her symptoms do not get better, we can refer her to urology       Return in about 3 months (around 12/10/2023) for Routine follow up.    Jeral Pinch, DO

## 2023-09-09 NOTE — Assessment & Plan Note (Signed)
Patient reports she has multiple abdominal surgeries, she had a ex laparotomy with Hartman's procedure in 2016 followed by lap colostomy reversal. EGD 08/2021 Erosive gastropathy with no bleeding and no stigmata of recent bleeding; pathology showed peptic duodenitis and mild chronic gastritis. Patient with history of constipation, that is managed with MiraLAX. She is having regular bowel movements.  She is currently on Protonix 40 mg daily.  Previous referral to gastroenterology was placed however she did not have an insurance, the referral was closed.  Patient reports that abdominal pain is mostly localized to left abdomen.  Plan: - Continue protonix 40 mg daily - Continue Dulcolax as needed for constipation. - Benzonante for cough as needed - Referral to GI is sent

## 2023-09-09 NOTE — Assessment & Plan Note (Signed)
BP Readings from Last 3 Encounters:  09/09/23 135/72  05/27/23 (!) 149/79  03/29/23 138/89   Patient is currently on amlodipine 5 mg daily.  Patient denies any headaches, vision changes, chest pain, shortness of breath, lower extremity swelling.  Patient denies any lightheadedness or dizziness.  Patient reports checks her blood pressure at home, however she does not remember the numbers at this time. Plan: -Continue amlodipine 5 mg daily -Please bring a blood pressure log with you at the next visit

## 2023-09-09 NOTE — Patient Instructions (Addendum)
Thank you, [name] for allowing [place] to provide your care today. Today we discussed:  For your blood pressure - Continue amlodipine 5 mg  Abdominal pain - Sent referral to GI. If they do not call you in two weeks, please contact our office.  -Continue MiraLax for constipation.   Urge Incontinence  - Timed voiding every 2 to 3 hours   I have ordered the following labs for you:  Lab Orders  No laboratory test(s) ordered today     Tests ordered today:  None  Referrals ordered today:    Referral Orders         Ambulatory referral to Gastroenterology      I have ordered the following medication/changed the following medications:   Stop the following medications: Medications Discontinued During This Encounter  Medication Reason   amLODipine (NORVASC) 5 MG tablet Reorder   benzonatate (TESSALON) 100 MG capsule Reorder     Start the following medications: Meds ordered this encounter  Medications   amLODipine (NORVASC) 5 MG tablet    Sig: Take 1 tablet (5 mg total) by mouth daily.    Dispense:  90 tablet    Refill:  1    [facility] patient   benzonatate (TESSALON) 100 MG capsule    Sig: Take 1 capsule (100 mg total) by mouth 3 (three) times daily as needed for cough.    Dispense:  21 capsule    Refill:  0     Follow up: 3 months    Remember:   Should you have any questions or concerns please call the internal medicine clinic at [phone].     Jeral Pinch, DO [facility]

## 2023-09-09 NOTE — Assessment & Plan Note (Signed)
Patient reports urinary continence for about a year, she wears diapers.  Patient reports she has accidents where she is unable to make it to the bathroom.  Patient reports she has urges to go, however she is unable to make it to the bathroom, and she urinates on herself.  Patient denies any symptoms of dysuria or increase in frequency.  Patient denies incontinence with cough, physical activity.  Plan:  -Patient is counseled on timed voiding, to void every 2-3 hours daily -If her symptoms do not get better, we can refer her to urology

## 2023-09-12 NOTE — Progress Notes (Signed)
Internal Medicine Clinic Attending  I was physically present during the key portions of the resident provided service and participated in the medical decision making of patient's management care. I reviewed pertinent patient test results.  The assessment, diagnosis, and plan were formulated together and I agree with the documentation in the resident's note.  Erlinda Hong, MD FACP

## 2023-10-22 ENCOUNTER — Emergency Department (HOSPITAL_BASED_OUTPATIENT_CLINIC_OR_DEPARTMENT_OTHER): Payer: Medicaid Other

## 2023-10-22 ENCOUNTER — Observation Stay (HOSPITAL_COMMUNITY): Payer: Medicaid Other

## 2023-10-22 ENCOUNTER — Observation Stay (HOSPITAL_BASED_OUTPATIENT_CLINIC_OR_DEPARTMENT_OTHER)
Admission: EM | Admit: 2023-10-22 | Discharge: 2023-10-23 | Disposition: A | Discharge status: Home / Self Care | Payer: Medicaid Other | Attending: Internal Medicine | Admitting: Internal Medicine

## 2023-10-22 DIAGNOSIS — K56600 Partial intestinal obstruction, unspecified as to cause: Secondary | ICD-10-CM | POA: Diagnosis not present

## 2023-10-22 DIAGNOSIS — Z9889 Other specified postprocedural states: Secondary | ICD-10-CM | POA: Insufficient documentation

## 2023-10-22 DIAGNOSIS — D509 Iron deficiency anemia, unspecified: Secondary | ICD-10-CM | POA: Diagnosis not present

## 2023-10-22 DIAGNOSIS — D539 Nutritional anemia, unspecified: Secondary | ICD-10-CM

## 2023-10-22 DIAGNOSIS — I10 Essential (primary) hypertension: Secondary | ICD-10-CM | POA: Diagnosis not present

## 2023-10-22 DIAGNOSIS — Z79899 Other long term (current) drug therapy: Secondary | ICD-10-CM | POA: Diagnosis not present

## 2023-10-22 DIAGNOSIS — K56609 Unspecified intestinal obstruction, unspecified as to partial versus complete obstruction: Secondary | ICD-10-CM | POA: Diagnosis present

## 2023-10-22 DIAGNOSIS — K5651 Intestinal adhesions [bands], with partial obstruction: Secondary | ICD-10-CM | POA: Diagnosis not present

## 2023-10-22 DIAGNOSIS — E876 Hypokalemia: Secondary | ICD-10-CM | POA: Insufficient documentation

## 2023-10-22 DIAGNOSIS — R10811 Right upper quadrant abdominal tenderness: Secondary | ICD-10-CM | POA: Diagnosis present

## 2023-10-22 LAB — COMPREHENSIVE METABOLIC PANEL
ALT: 6 U/L (ref 0–44)
AST: 13 U/L — ABNORMAL LOW (ref 15–41)
Albumin: 4.2 g/dL (ref 3.5–5.0)
Alkaline Phosphatase: 96 U/L (ref 38–126)
Anion gap: 9 (ref 5–15)
BUN: 10 mg/dL (ref 8–23)
CO2: 29 mmol/L (ref 22–32)
Calcium: 9.1 mg/dL (ref 8.9–10.3)
Chloride: 97 mmol/L — ABNORMAL LOW (ref 98–111)
Creatinine, Ser: 0.56 mg/dL (ref 0.44–1.00)
GFR, Estimated: >60 mL/min (ref >60)
Glucose, Bld: 121 mg/dL — ABNORMAL HIGH (ref 70–99)
Potassium: 3.3 mmol/L — ABNORMAL LOW (ref 3.5–5.1)
Sodium: 135 mmol/L (ref 135–145)
Total Bilirubin: 0.5 mg/dL (ref <1.2)
Total Protein: 7 g/dL (ref 6.5–8.1)

## 2023-10-22 LAB — CBC
HCT: 34.5 % — ABNORMAL LOW (ref 36.0–46.0)
Hemoglobin: 11.3 g/dL — ABNORMAL LOW (ref 12.0–15.0)
MCH: 22.6 pg — ABNORMAL LOW (ref 26.0–34.0)
MCHC: 32.8 g/dL (ref 30.0–36.0)
MCV: 68.9 fL — ABNORMAL LOW (ref 80.0–100.0)
Platelets: 195 10*3/uL (ref 150–400)
RBC: 5.01 MIL/uL (ref 3.87–5.11)
RDW: 14 % (ref 11.5–15.5)
WBC: 12.6 10*3/uL — ABNORMAL HIGH (ref 4.0–10.5)
nRBC: 0 % (ref 0.0–0.2)

## 2023-10-22 LAB — URINALYSIS, ROUTINE W REFLEX MICROSCOPIC
Bacteria, UA: NONE SEEN
Bilirubin Urine: NEGATIVE
Glucose, UA: NEGATIVE mg/dL
Hgb urine dipstick: NEGATIVE
Ketones, ur: NEGATIVE mg/dL
Nitrite: NEGATIVE
Protein, ur: NEGATIVE mg/dL
Specific Gravity, Urine: 1.012 (ref 1.005–1.030)
pH: 7.5 (ref 5.0–8.0)

## 2023-10-22 LAB — LIPASE, BLOOD: Lipase: 14 U/L (ref 11–51)

## 2023-10-22 LAB — HIV ANTIBODY (ROUTINE TESTING W REFLEX): HIV Screen 4th Generation wRfx: NONREACTIVE

## 2023-10-22 MED ORDER — ONDANSETRON HCL 4 MG/2ML IJ SOLN
4.0000 mg | Freq: Once | INTRAMUSCULAR | Status: AC
  Administered 2023-10-22: 4 mg via INTRAVENOUS
  Filled 2023-10-22: qty 2

## 2023-10-22 MED ORDER — POTASSIUM CHLORIDE 10 MEQ/100ML IV SOLN
10.0000 meq | INTRAVENOUS | Status: AC
  Administered 2023-10-22 (×3): 10 meq via INTRAVENOUS
  Filled 2023-10-22 (×3): qty 100

## 2023-10-22 MED ORDER — SODIUM CHLORIDE 0.9 % IV SOLN: INTRAVENOUS | Status: DC

## 2023-10-22 MED ORDER — ACETAMINOPHEN 10 MG/ML IV SOLN
1000.0000 mg | Freq: Four times a day (QID) | INTRAVENOUS | Status: AC
  Administered 2023-10-22 – 2023-10-23 (×4): 1000 mg via INTRAVENOUS
  Filled 2023-10-22 (×4): qty 100

## 2023-10-22 MED ORDER — DIATRIZOATE MEGLUMINE & SODIUM 66-10 % PO SOLN
90.0000 mL | Freq: Once | ORAL | Status: AC
  Administered 2023-10-22: 90 mL via NASOGASTRIC
  Filled 2023-10-22: qty 90

## 2023-10-22 MED ORDER — HEPARIN SODIUM (PORCINE) 5000 UNIT/ML IJ SOLN
5000.0000 [IU] | Freq: Three times a day (TID) | INTRAMUSCULAR | Status: DC
  Administered 2023-10-22 – 2023-10-23 (×2): 5000 [IU] via SUBCUTANEOUS
  Filled 2023-10-22 (×2): qty 1

## 2023-10-22 MED ORDER — ONDANSETRON HCL 4 MG/2ML IJ SOLN: 4.0000 mg | Freq: Four times a day (QID) | INTRAMUSCULAR | Status: DC | PRN

## 2023-10-22 MED ORDER — KETOROLAC TROMETHAMINE 30 MG/ML IJ SOLN: 30.0000 mg | Freq: Three times a day (TID) | INTRAMUSCULAR | Status: DC | PRN

## 2023-10-22 MED ORDER — MORPHINE SULFATE (PF) 4 MG/ML IV SOLN
4.0000 mg | Freq: Once | INTRAVENOUS | Status: AC
  Administered 2023-10-22: 4 mg via INTRAVENOUS
  Filled 2023-10-22: qty 1

## 2023-10-22 MED ORDER — IOHEXOL 300 MG/ML  SOLN
100.0000 mL | Freq: Once | INTRAMUSCULAR | Status: AC | PRN
  Administered 2023-10-22: 100 mL via INTRAVENOUS

## 2023-10-22 NOTE — ED Notes (Addendum)
Pt unable to provide a urine sample at this time. Daughter is at bedside and will let us know when pt is able to go.

## 2023-10-22 NOTE — ED Notes (Signed)
Maintenance fluids set to 165mL/hr for comfort while potassium IV infusing.

## 2023-10-22 NOTE — Plan of Care (Signed)
  Problem: Pain Management: Goal: General experience of comfort will improve Outcome: Progressing   Problem: Safety: Goal: Ability to remain free from injury will improve Outcome: Progressing

## 2023-10-22 NOTE — H&P (Signed)
Date: 10/22/2023               Patient Name:  Militza [name] MRN: [id number]  DOB: 10-03-1961 Age / Sex: 63 y.o., female   PCP: Rana Snare, DO         Medical Service: Internal Medicine Teaching Service         Attending Physician: Dr. Inez Catalina, MD      First Contact: Dr. Monna Fam, MD Pager [phone]    Second Contact: Dr. Rocky Morel, DO Pager [phone]         After Hours (After 5p/  First Contact Pager: [phone]  weekends / holidays): Second Contact Pager: [phone]   SUBJECTIVE   Chief Complaint: abdominal pain  History of Present Illness:  Patient is a 62 year old woman with past medical history significant for bowel perforation s/p colostomy/colectomy s/p reversal in 2016, presenting with abdominal pain. Patient reports since early this morning experiencing severe periumbilical pain, nausea, and multiple episodes of nonbloody vomiting. She did have a bowel movement today. She has daily mild abdominal pain ever since a previous small bowel obstruction in 2022 which resolved without surgery. She denies fever, chest pain, shortness of breath, dysuria, hematuria.   ED Course: Afeb, HDS, ORA WBC 12.6 CTAP: dilated loops of small bowel, may represent ileus or partial small bowel obstruction  Past Medical History: Past Medical History:  Diagnosis Date   Anemia 07/27/2016   Annual physical exam 06/27/2020   Atypical chest pain 08/07/2020   Clotting disorder (HCC)    Colostomy stricture (HCC) 07/02/2015   Constipation    Cough 08/07/2020   Depression    Diarrhea 11/13/2020   Acute watery diarrhea starting 12/14 associated with abdominal pain, headache, feeling "hot and cold".    Difficulty sleeping    Encounter for completion of form with patient 10/23/2021   Headache 07/18/2018   Hypokalemia 06/27/2020   Influenza vaccine administered 08/06/2021   Literacy level of illiterate 10/01/2016   Less than one year of formal education in [place].  [name] [name] is taking classes  for reading and writing in [place].    S/P colostomy [facility]) 03/02/2015   March 2016: colon perforation following screening colonoscopy    S/P colostomy [facility]) 03/02/2015   March 2016: colon perforation following screening colonoscopy    SBO (small bowel obstruction) (HCC) 03/18/2021    Meds:  Current Outpatient Medications  Medication Instructions   acetaminophen (TYLENOL) 1,000 mg, Oral, Every 6 hours PRN   amLODipine (NORVASC) 5 mg, Oral, Daily   benzonatate (TESSALON) 100 mg, Oral, 3 times daily PRN   donepezil (ARICEPT) 5 mg, Oral, Daily at bedtime   pantoprazole (PROTONIX) 40 mg, Oral, Daily    Past Surgical History:  Procedure Laterality Date   ABDOMINAL HYSTERECTOMY  1994   APPENDECTOMY  1993   CESAREAN SECTION  1992   COLONOSCOPY N/A 05/30/2015   Procedure: COLONOSCOPY;  Surgeon: Romie Levee, MD;  Location: [facility];  Service: Endoscopy;  Laterality: N/A;   COLONOSCOPY WITH PROPOFOL N/A 06/12/2015   Procedure: COLONOSCOPY WITH PROPOFOL;  Surgeon: Romie Levee, MD;  Location: [facility];  Service: Endoscopy;  Laterality: N/A;   COLOSTOMY  01/2015   COLOSTOMY TAKEDOWN N/A 07/02/2015   Procedure: LAPAROSCOPIC COLOSTOMY REVERSAL SPLENIC FLEXURE MOBILIZATION AND PARTIAL COLECTOMY;  Surgeon: Romie Levee, MD;  Location: [facility];  Service: General;  Laterality: N/A;   DILATION AND CURETTAGE OF UTERUS  1990   "miscarriage"   LAPAROTOMY N/A 02/20/2015   Procedure:  PARTIAL COLECTOMY END  ILEOSTOMY AND HARTMAN'S PROCEDURE;  Surgeon: Romie Levee, MD;  Location: [facility];  Service: General;  Laterality: N/A;    Social:  Living Situation: Lives with daughter Level of Function: independent in ADLs and iADLs PCP: Rana Snare, DO Tobacco: 1/2 to 2 packs per day Alcohol: None Drugs: None  Family History: noncontributory  Allergies: Allergies as of 10/22/2023   (No Known Allergies)   Review of Systems: A complete ROS was negative except as per HPI.   OBJECTIVE:    Physical Exam: Blood pressure 134/77, pulse 64, temperature 97.6 F (36.4 C), temperature source Oral, resp. rate 13, SpO2 98%.  Constitutional: well-appearing, slightly uncomfortable HENT: normocephalic atraumatic, mucous membranes moist Eyes: conjunctiva non-erythematous Neck: supple Cardiovascular: regular rate and rhythm, no m/r/g Pulmonary/Chest: normal work of breathing on room air, lungs clear to auscultation bilaterally Abdominal: tender to palpation in periumbilical area, no rebound tenderness, nondistended MSK: normal bulk and tone Neurological: alert & oriented x 3, 5/5 strength in bilateral upper and lower extremities, normal gait Skin: warm and dry  Labs: CBC    Component Value Date/Time   WBC 12.6 (H) 10/22/2023 1154   RBC 5.01 10/22/2023 1154   HGB 11.3 (L) 10/22/2023 1154   HGB 11.1 11/13/2020 1656   HCT 34.5 (L) 10/22/2023 1154   HCT 35.7 11/13/2020 1656   PLT 195 10/22/2023 1154   PLT 238 11/13/2020 1656   MCV 68.9 (L) 10/22/2023 1154   MCV 74 (L) 11/13/2020 1656   MCH 22.6 (L) 10/22/2023 1154   MCHC 32.8 10/22/2023 1154   RDW 14.0 10/22/2023 1154   RDW 14.6 11/13/2020 1656   LYMPHSABS 1.9 03/25/2022 1137   MONOABS 0.3 03/25/2022 1137   EOSABS 0.1 03/25/2022 1137   BASOSABS 0.0 03/25/2022 1137     CMP     Component Value Date/Time   NA 135 10/22/2023 1154   NA 140 10/01/2022 1114   K 3.3 (L) 10/22/2023 1154   CL 97 (L) 10/22/2023 1154   CO2 29 10/22/2023 1154   GLUCOSE 121 (H) 10/22/2023 1154   BUN 10 10/22/2023 1154   BUN 6 (L) 10/01/2022 1114   CREATININE 0.56 10/22/2023 1154   CREATININE 0.57 12/10/2013 1103   CALCIUM 9.1 10/22/2023 1154   PROT 7.0 10/22/2023 1154   PROT 6.8 11/13/2020 1656   ALBUMIN 4.2 10/22/2023 1154   ALBUMIN 4.4 11/13/2020 1656   AST 13 (L) 10/22/2023 1154   ALT 6 10/22/2023 1154   ALKPHOS 96 10/22/2023 1154   BILITOT 0.5 10/22/2023 1154   BILITOT 0.3 11/13/2020 1656   GFRNONAA >60 10/22/2023 1154   GFRAA 114  11/13/2020 1656    Imaging: CT ABDOMEN PELVIS W CONTRAST  Result Date: 10/22/2023 CLINICAL DATA:  History of colostomy and reversal presenting with acute onset left-sided abdominal pain and vomiting EXAM: CT ABDOMEN AND PELVIS WITH CONTRAST TECHNIQUE: Multidetector CT imaging of the abdomen and pelvis was performed using the standard protocol following bolus administration of intravenous contrast. RADIATION DOSE REDUCTION: This exam was performed according to the departmental dose-optimization program which includes automated exposure control, adjustment of the mA and/or kV according to patient size and/or use of iterative reconstruction technique. CONTRAST:  OMNIPAQUE IOHEXOL 300 MG/ML  SOLN COMPARISON:  CT abdomen and pelvis dated 08/18/2021 FINDINGS: Lower chest: No focal consolidation or pulmonary nodule in the lung bases. No pleural effusion or pneumothorax demonstrated. Partially imaged heart size is normal. Hepatobiliary: No focal hepatic lesions. No intra or extrahepatic biliary  ductal dilation. Normal gallbladder. Pancreas: No focal lesions or main ductal dilation. Spleen: Normal in size.  Similar coarse calcifications. Adrenals/Urinary Tract: No adrenal nodules. No suspicious renal mass, calculi or hydronephrosis. Distended urinary bladder. Stomach/Bowel: Normal appearance of the stomach. Postsurgical changes of the sigmoid colon. Anastomosis is patent. A few dilated loops of lower abdominal small bowel with gradual luminal tapering in the right lower quadrant. No abrupt caliber transition. Moderate volume stool throughout the ascending and transverse colon. Reported appendectomy, however there is a right lower quadrant tubular structure adjacent to the cecum (5:40). Vascular/Lymphatic: Aortic atherosclerosis. No enlarged abdominal or pelvic lymph nodes. Reproductive: No adnexal masses. Other: No free fluid, fluid collection, or free air. Similar coarse calcification in the right lower  quadrant mesentery. Multiple additional subcentimeter calcified foci within the left upper quadrant mesentery and aortocaval region, which may represent calcified lymph nodes. Musculoskeletal: No acute or abnormal lytic or blastic osseous lesions. IMPRESSION: 1. A few dilated loops of lower abdominal small bowel with gradual luminal tapering in the right lower quadrant, which may represent ileus or partial small bowel obstruction. No abrupt caliber transition. 2. Distended urinary bladder may reflect urinary retention. 3. Reported appendectomy, however there is a right lower quadrant tubular structure adjacent to the cecum, which may represent a remnant appendiceal stump. 4.  Aortic Atherosclerosis (ICD10-I70.0). Electronically Signed   By: Agustin Cree M.D.   On: 10/22/2023 13:17    ASSESSMENT & PLAN:   Assessment & Plan by Problem: Principal Problem:   Partial bowel obstruction (HCC) Active Problems:   SBO (small bowel obstruction) (HCC)  Jaqlyn Gregor is a 62 y.o. person living with a history of bowel perforation s/p colostomy/colectomy s/p reversal in 2016, who presented with abdominal pain and nausea and admitted for small bowel obstruction on hospital day 0  #Small bowel obstruction #s/p bowel perforation with colostomy and reversal in 2016 Patient presenting with acute abdominal pain and n/v, found to have small bowel obstruction vs ileus on CTAP. Patient is HDS, nausea and vomiting have subsided with zofran and IV morphine. Pain medication transitioned to toradol to avoid opiates which may worsen ileus.  - General surgery consulted - NG tube to suction - NPO - Toradol 30mg  q8 prn, trying to avoid opiates  #Hypokalemia 3.3 at admission, in setting of acute GI losses. Continue to monitor with daily labs, replete as necessary  #Microcytic anemia Hgb 11.3 with MCV 69. Appears to be a chronic issue for her, actually above baseline Hgb. Will need outpatient follow up.   #HTN On amlodipine 5mg  at  home. Holding at this time  Diet: NPO VTE: Heparin IVF: None Code: Full  Prior to Admission Living Arrangement: Home with daughter Anticipated Discharge Location: pending PT/OT eval Barriers to Discharge: pending medical stability  Signed: Monna Fam, MD Internal Medicine Resident PGY-1  10/22/2023, 5:24 PM

## 2023-10-22 NOTE — Hospital Course (Signed)
Bowel movement every other day, this am 1 ppd

## 2023-10-22 NOTE — ED Triage Notes (Signed)
Pt reports left side abdominal pain that began at 7am this morning. Pt states 2 BM since pain started, normal soft consistency, did not relieve the pain.  Denies fever.  Pt also vomited 2-3 times this morning.

## 2023-10-22 NOTE — ED Provider Notes (Signed)
[place] EMERGENCY DEPARTMENT AT [facility] Provider Note   CSN: [id number] Arrival date & time: 10/22/23  1053     History  Chief Complaint  Patient presents with   Abdominal Pain    [name] [name] is a 62 y.o. female.  Patient presents emergency department for evaluation of abdominal pain, bilateral, upper starting this morning around 7 AM.  This was associated with multiple episodes of vomiting.  No diarrhea.  No fevers, chest pain, shortness of breath or cough.  No burning with urination, increased frequency or urgency, hematuria.  Patient has a history of bowel perforation status post colostomy/colectomy status post reversal.  She also has had a partial small bowel obstruction which resolved without surgery.  No treatments prior to arrival.       Home Medications Prior to Admission medications   Medication Sig Start Date End Date Taking? Authorizing Provider  acetaminophen (TYLENOL) 500 MG tablet Take 1,000 mg by mouth every 6 (six) hours as needed for headache or moderate pain.    [provider]  amLODipine (NORVASC) 5 MG tablet Take 1 tablet (5 mg total) by mouth daily. 09/09/23 03/07/24  Tawkaliyar, Roya, DO  benzonatate (TESSALON) 100 MG capsule Take 1 capsule (100 mg total) by mouth 3 (three) times daily as needed for cough. 09/09/23   Tawkaliyar, Roya, DO  donepezil (ARICEPT) 5 MG tablet Take 1 tablet (5 mg total) by mouth at bedtime. 02/21/23   Windell Norfolk, MD  pantoprazole (PROTONIX) 40 MG tablet Take 1 tablet (40 mg total) by mouth daily. 03/29/23   Rana Snare, DO      Allergies    Patient has no known allergies.    Review of Systems   Review of Systems  Physical Exam Updated Vital Signs BP (!) 156/88 (BP Location: Right Arm)   Pulse 74   Temp 98.7 F (37.1 C) (Oral)   Resp 14   SpO2 100%  Physical Exam Vitals and nursing note reviewed.  Constitutional:      General: She is not in acute distress.    Appearance: She is well-developed.   HENT:     Head: Normocephalic and atraumatic.     Right Ear: External ear normal.     Left Ear: External ear normal.     Nose: Nose normal.  Eyes:     Conjunctiva/sclera: Conjunctivae normal.  Cardiovascular:     Rate and Rhythm: Normal rate and regular rhythm.     Heart sounds: No murmur heard. Pulmonary:     Effort: No respiratory distress.     Breath sounds: No wheezing, rhonchi or rales.  Abdominal:     Palpations: Abdomen is soft.     Tenderness: There is abdominal tenderness (Right upper, left upper, moderate). There is no guarding or rebound. Negative signs include Murphy's sign and McBurney's sign.  Musculoskeletal:     Cervical back: Normal range of motion and neck supple.     Right lower leg: No edema.     Left lower leg: No edema.  Skin:    General: Skin is warm and dry.     Findings: No rash.  Neurological:     General: No focal deficit present.     Mental Status: She is alert. Mental status is at baseline.     Motor: No weakness.  Psychiatric:        Mood and Affect: Mood normal.     ED Results / Procedures / Treatments   Labs (all labs ordered are  listed, but only abnormal results are displayed) Labs Reviewed  COMPREHENSIVE METABOLIC PANEL - Abnormal; Notable for the following components:      Result Value   Potassium 3.3 (*)    Chloride 97 (*)    Glucose, Bld 121 (*)    AST 13 (*)    All other components within normal limits  CBC - Abnormal; Notable for the following components:   WBC 12.6 (*)    Hemoglobin 11.3 (*)    HCT 34.5 (*)    MCV 68.9 (*)    MCH 22.6 (*)    All other components within normal limits  URINALYSIS, ROUTINE W REFLEX MICROSCOPIC - Abnormal; Notable for the following components:   Color, Urine COLORLESS (*)    Leukocytes,Ua TRACE (*)    All other components within normal limits  LIPASE, BLOOD    EKG None  Radiology CT ABDOMEN PELVIS W CONTRAST  Result Date: 10/22/2023 CLINICAL DATA:  History of colostomy and reversal  presenting with acute onset left-sided abdominal pain and vomiting EXAM: CT ABDOMEN AND PELVIS WITH CONTRAST TECHNIQUE: Multidetector CT imaging of the abdomen and pelvis was performed using the standard protocol following bolus administration of intravenous contrast. RADIATION DOSE REDUCTION: This exam was performed according to the departmental dose-optimization program which includes automated exposure control, adjustment of the mA and/or kV according to patient size and/or use of iterative reconstruction technique. CONTRAST:  OMNIPAQUE IOHEXOL 300 MG/ML  SOLN COMPARISON:  CT abdomen and pelvis dated 08/18/2021 FINDINGS: Lower chest: No focal consolidation or pulmonary nodule in the lung bases. No pleural effusion or pneumothorax demonstrated. Partially imaged heart size is normal. Hepatobiliary: No focal hepatic lesions. No intra or extrahepatic biliary ductal dilation. Normal gallbladder. Pancreas: No focal lesions or main ductal dilation. Spleen: Normal in size.  Similar coarse calcifications. Adrenals/Urinary Tract: No adrenal nodules. No suspicious renal mass, calculi or hydronephrosis. Distended urinary bladder. Stomach/Bowel: Normal appearance of the stomach. Postsurgical changes of the sigmoid colon. Anastomosis is patent. A few dilated loops of lower abdominal small bowel with gradual luminal tapering in the right lower quadrant. No abrupt caliber transition. Moderate volume stool throughout the ascending and transverse colon. Reported appendectomy, however there is a right lower quadrant tubular structure adjacent to the cecum (5:40). Vascular/Lymphatic: Aortic atherosclerosis. No enlarged abdominal or pelvic lymph nodes. Reproductive: No adnexal masses. Other: No free fluid, fluid collection, or free air. Similar coarse calcification in the right lower quadrant mesentery. Multiple additional subcentimeter calcified foci within the left upper quadrant mesentery and aortocaval region, which may  represent calcified lymph nodes. Musculoskeletal: No acute or abnormal lytic or blastic osseous lesions. IMPRESSION: 1. A few dilated loops of lower abdominal small bowel with gradual luminal tapering in the right lower quadrant, which may represent ileus or partial small bowel obstruction. No abrupt caliber transition. 2. Distended urinary bladder may reflect urinary retention. 3. Reported appendectomy, however there is a right lower quadrant tubular structure adjacent to the cecum, which may represent a remnant appendiceal stump. 4.  Aortic Atherosclerosis (ICD10-I70.0). Electronically Signed   By: Agustin Cree M.D.   On: 10/22/2023 13:17    Procedures Procedures    Medications Ordered in ED Medications  0.9 %  sodium chloride infusion (has no administration in time range)  morphine (PF) 4 MG/ML injection 4 mg (4 mg Intravenous Given 10/22/23 1204)  ondansetron (ZOFRAN) injection 4 mg (4 mg Intravenous Given 10/22/23 1203)  iohexol (OMNIPAQUE) 300 MG/ML solution 100 mL (100 mLs Intravenous Contrast Given  10/22/23 1247)    ED Course/ Medical Decision Making/ A&P    Patient seen and examined. History obtained directly from patient.   Labs/EKG: CBC, CMP, lipase, UA  Imaging: Ordered CT abdomen pelvis  Medications/Fluids: Ordered: Morphine 4 mg, Zofran 4 mg.   Most recent vital signs reviewed and are as follows: BP (!) 156/88 (BP Location: Right Arm)   Pulse 74   Temp 98.7 F (37.1 C) (Oral)   Resp 14   SpO2 100%   Initial impression: Abdominal pain with vomiting in patient with significant past surgical history.  Will need to rule out recurrent bowel obstruction, intra-abdominal infection.  2:14 PM Reassessment performed. Patient appears stable.  Pain better controlled, no further vomiting.  Labs personally reviewed and interpreted including: CBC with elevated white blood cell count at 12.6, hemoglobin slightly low 11.3, microcytic similar to baseline; CMP with slight hypokalemia at  3.3, slightly low chloride at 97, normal kidney function, normal liver function testing; lipase normal at 14; UA unremarkable.  Imaging personally visualized and interpreted including: CT scan of the abdomen pelvis, agree signs of bowel obstruction versus ileus, concern for bowel obstruction especially given surgical history.  Reviewed pertinent lab work and imaging with patient at bedside. Questions answered.   Most current vital signs reviewed and are as follows: BP (!) 141/83   Pulse 65   Temp 98.7 F (37.1 C) (Oral)   Resp 14   SpO2 100%   Plan: Admission to hospital.  I have spoken with Dr. Freida Busman of general surgery and surgery will consult.  3:07 PM I discussed case with Dr. Sunnie Nielsen of [facility] hospitalist.  Recommends IV potassium for mild hypokalemia.  Plan for admission to [facility].                               Medical Decision Making Amount and/or Complexity of Data Reviewed Labs: ordered. Radiology: ordered.  Risk Prescription drug management. Decision regarding hospitalization.   For this patient's complaint of abdominal pain, the following conditions were considered on the differential diagnosis: gastritis/PUD, enteritis/duodenitis, appendicitis, cholelithiasis/cholecystitis, cholangitis, pancreatitis, ruptured viscus, colitis, diverticulitis, small/large bowel obstruction, proctitis, cystitis, pyelonephritis, ureteral colic, aortic dissection, aortic aneurysm. In women, ectopic pregnancy, pelvic inflammatory disease, ovarian cysts, and tubo-ovarian abscess were also considered. Atypical chest etiologies were also considered including ACS, PE, and pneumonia.  Given abdominal pain and vomiting, history of small bowel obstruction and CT suggestive of small bowel obstruction --patient will be admitted for bowel rest and observation, surgical consultation.        Final Clinical Impression(s) / ED Diagnoses Final diagnoses:  Partial small bowel obstruction [facility])     Rx / DC Orders ED Discharge Orders     None         Renne Crigler, PA-C 10/22/23 1507    Rondel Baton, MD 10/27/23 1734

## 2023-10-22 NOTE — Consult Note (Signed)
Jolie Hafley 22-Mar-1961  [id number].    Requesting MD: Dr. Monna Fam Chief Complaint/Reason for Consult: small bowel obstruction  HPI:  Ms. Goon is a 62 yo female who presented to the ED today with abdominal pain, nausea, and vomiting. Her symptoms began this morning. She reports that she did have a bowel movement this morning prior to coming to the ED. Labs showed a mild leukocytosis with a WBC of 11. CT scan showed several loops of mildly dilated small bowel, concerning for a partial SBO vs ileus. The patient was admitted to medicine and an NG tube was placed. General surgery was consulted.   The patient has previously had an open sigmoid colectomy in 2016 for a sigmoid perforation, followed by a colostomy reversal later that year.  ROS: Review of Systems  Constitutional:  Negative for chills and fever.  Respiratory:  Negative for shortness of breath.   Cardiovascular:  Negative for chest pain.  Gastrointestinal:  Positive for abdominal pain, nausea and vomiting.    Family History  Problem Relation Age of Onset   Colon cancer Neg Hx    Rectal cancer Neg Hx    Stomach cancer Neg Hx     Past Medical History:  Diagnosis Date   Anemia 07/27/2016   Annual physical exam 06/27/2020   Atypical chest pain 08/07/2020   Clotting disorder (HCC)    Colostomy stricture (HCC) 07/02/2015   Constipation    Cough 08/07/2020   Depression    Diarrhea 11/13/2020   Acute watery diarrhea starting 12/14 associated with abdominal pain, headache, feeling "hot and cold".    Difficulty sleeping    Encounter for completion of form with patient 10/23/2021   Headache 07/18/2018   Hypokalemia 06/27/2020   Influenza vaccine administered 08/06/2021   Literacy level of illiterate 10/01/2016   Less than one year of formal education in [place].  Ms Hoda is taking classes for reading and writing in [place].    S/P colostomy [facility]) 03/02/2015   March 2016: colon perforation following screening colonoscopy    S/P  colostomy [facility]) 03/02/2015   March 2016: colon perforation following screening colonoscopy    SBO (small bowel obstruction) (HCC) 03/18/2021    Past Surgical History:  Procedure Laterality Date   ABDOMINAL HYSTERECTOMY  1994   APPENDECTOMY  1993   CESAREAN SECTION  1992   COLONOSCOPY N/A 05/30/2015   Procedure: COLONOSCOPY;  Surgeon: Romie Levee, MD;  Location: [facility];  Service: Endoscopy;  Laterality: N/A;   COLONOSCOPY WITH PROPOFOL N/A 06/12/2015   Procedure: COLONOSCOPY WITH PROPOFOL;  Surgeon: Romie Levee, MD;  Location: [facility];  Service: Endoscopy;  Laterality: N/A;   COLOSTOMY  01/2015   COLOSTOMY TAKEDOWN N/A 07/02/2015   Procedure: LAPAROSCOPIC COLOSTOMY REVERSAL SPLENIC FLEXURE MOBILIZATION AND PARTIAL COLECTOMY;  Surgeon: Romie Levee, MD;  Location: [facility];  Service: General;  Laterality: N/A;   DILATION AND CURETTAGE OF UTERUS  1990   "miscarriage"   LAPAROTOMY N/A 02/20/2015   Procedure: PARTIAL COLECTOMY END  ILEOSTOMY AND HARTMAN'S PROCEDURE;  Surgeon: Romie Levee, MD;  Location: [facility];  Service: General;  Laterality: N/A;    Social History:  reports that she has been smoking cigarettes. She has a 18.5 pack-year smoking history. She has never used smokeless tobacco. She reports that she does not drink alcohol and does not use drugs.  Allergies: No Known Allergies  Medications Prior to Admission  Medication Sig Dispense Refill   acetaminophen (TYLENOL) 500 MG tablet Take 1,000 mg by  mouth every 6 (six) hours as needed for headache or moderate pain.     amLODipine (NORVASC) 5 MG tablet Take 1 tablet (5 mg total) by mouth daily. 90 tablet 1   donepezil (ARICEPT) 5 MG tablet Take 1 tablet (5 mg total) by mouth at bedtime. 30 tablet 11   pantoprazole (PROTONIX) 40 MG tablet Take 1 tablet (40 mg total) by mouth daily. 30 tablet 2     Physical Exam: Blood pressure (!) 157/90, pulse 66, temperature (!) 97.3 F (36.3 C), temperature source Oral, resp. rate  17, SpO2 97%. General: resting comfortably, appears stated age, no apparent distress Neurological: alert and oriented, no focal deficits HEENT: normocephalic, atraumatic, NG in place Respiratory: normal work of breathing on room air Abdomen: soft, nondistended, mildly tender to palpation in epigastric area, no rebound tenderness or guarding. Well-healed surgical scars. Extremities: warm and well-perfused, no deformities, moving all extremities spontaneously Psychiatric: normal mood and affect Skin: warm and dry, no jaundice, no rashes or lesions   Results for orders placed or performed during the hospital encounter of 10/22/23 (from the past 48 hour(s))  Lipase, blood     Status: None   Collection Time: 10/22/23 11:54 AM  Result Value Ref Range   Lipase 14 11 - 51 U/L    Comment: Performed at [facility], [address] [place], [place] [address]  Comprehensive metabolic panel     Status: Abnormal   Collection Time: 10/22/23 11:54 AM  Result Value Ref Range   Sodium 135 135 - 145 mmol/L   Potassium 3.3 (L) 3.5 - 5.1 mmol/L   Chloride 97 (L) 98 - 111 mmol/L   CO2 29 22 - 32 mmol/L   Glucose, Bld 121 (H) 70 - 99 mg/dL    Comment: Glucose reference range applies only to samples taken after fasting for at least 8 hours.   BUN 10 8 - 23 mg/dL   Creatinine, Ser 8.46 0.44 - 1.00 mg/dL   Calcium 9.1 8.9 - 96.2 mg/dL   Total Protein 7.0 6.5 - 8.1 g/dL   Albumin 4.2 3.5 - 5.0 g/dL   AST 13 (L) 15 - 41 U/L   ALT 6 0 - 44 U/L   Alkaline Phosphatase 96 38 - 126 U/L   Total Bilirubin 0.5 <1.2 mg/dL   GFR, Estimated >95 >28 mL/min    Comment: (NOTE) Calculated using the CKD-EPI Creatinine Equation (2021)    Anion gap 9 5 - 15    Comment: Performed at [facility], [address] [place], [place] [address]  CBC     Status: Abnormal   Collection Time: 10/22/23 11:54 AM  Result Value Ref Range   WBC 12.6 (H) 4.0 - 10.5 K/uL   RBC 5.01 3.87 - 5.11  MIL/uL   Hemoglobin 11.3 (L) 12.0 - 15.0 g/dL   HCT 40.1 (L) 02.7 - 25.3 %   MCV 68.9 (L) 80.0 - 100.0 fL   MCH 22.6 (L) 26.0 - 34.0 pg   MCHC 32.8 30.0 - 36.0 g/dL   RDW 66.4 40.3 - 47.4 %   Platelets 195 150 - 400 K/uL    Comment: REPEATED TO VERIFY   nRBC 0.0 0.0 - 0.2 %    Comment: Performed at [facility], [address], [place], [place] [address]  Urinalysis, Routine w reflex microscopic -Urine, Clean Catch     Status: Abnormal   Collection Time: 10/22/23  1:35 PM  Result Value Ref Range   Color,  Urine COLORLESS (A) YELLOW   APPearance CLEAR CLEAR   Specific Gravity, Urine 1.012 1.005 - 1.030   pH 7.5 5.0 - 8.0   Glucose, UA NEGATIVE NEGATIVE mg/dL   Hgb urine dipstick NEGATIVE NEGATIVE   Bilirubin Urine NEGATIVE NEGATIVE   Ketones, ur NEGATIVE NEGATIVE mg/dL   Protein, ur NEGATIVE NEGATIVE mg/dL   Nitrite NEGATIVE NEGATIVE   Leukocytes,Ua TRACE (A) NEGATIVE   RBC / HPF 0-5 0 - 5 RBC/hpf   WBC, UA 0-5 0 - 5 WBC/hpf   Bacteria, UA NONE SEEN NONE SEEN   Squamous Epithelial / HPF 0-5 0 - 5 /HPF    Comment: Performed at [facility], [address] [place], [place] [address]  HIV Antibody (routine testing w rflx)     Status: None   Collection Time: 10/22/23  5:36 PM  Result Value Ref Range   HIV Screen 4th Generation wRfx Non Reactive Non Reactive    Comment: Performed at [facility], [address] [place], [place] [address]   DG Abd Portable 1V-Small Bowel Protocol-Position Verification  Result Date: 10/22/2023 CLINICAL DATA:  Nasogastric tube EXAM: PORTABLE ABDOMEN - 1 VIEW COMPARISON:  Abdominal x-ray 03/19/2021 FINDINGS: Nasogastric tube tip is in the distal body of the stomach. IMPRESSION: Nasogastric tube tip is in the distal body of the stomach. Electronically Signed   By: Darliss Cheney M.D.   On: 10/22/2023 18:15   CT ABDOMEN PELVIS W CONTRAST  Result Date: 10/22/2023 CLINICAL DATA:  History of colostomy and  reversal presenting with acute onset left-sided abdominal pain and vomiting EXAM: CT ABDOMEN AND PELVIS WITH CONTRAST TECHNIQUE: Multidetector CT imaging of the abdomen and pelvis was performed using the standard protocol following bolus administration of intravenous contrast. RADIATION DOSE REDUCTION: This exam was performed according to the departmental dose-optimization program which includes automated exposure control, adjustment of the mA and/or kV according to patient size and/or use of iterative reconstruction technique. CONTRAST:  OMNIPAQUE IOHEXOL 300 MG/ML  SOLN COMPARISON:  CT abdomen and pelvis dated 08/18/2021 FINDINGS: Lower chest: No focal consolidation or pulmonary nodule in the lung bases. No pleural effusion or pneumothorax demonstrated. Partially imaged heart size is normal. Hepatobiliary: No focal hepatic lesions. No intra or extrahepatic biliary ductal dilation. Normal gallbladder. Pancreas: No focal lesions or main ductal dilation. Spleen: Normal in size.  Similar coarse calcifications. Adrenals/Urinary Tract: No adrenal nodules. No suspicious renal mass, calculi or hydronephrosis. Distended urinary bladder. Stomach/Bowel: Normal appearance of the stomach. Postsurgical changes of the sigmoid colon. Anastomosis is patent. A few dilated loops of lower abdominal small bowel with gradual luminal tapering in the right lower quadrant. No abrupt caliber transition. Moderate volume stool throughout the ascending and transverse colon. Reported appendectomy, however there is a right lower quadrant tubular structure adjacent to the cecum (5:40). Vascular/Lymphatic: Aortic atherosclerosis. No enlarged abdominal or pelvic lymph nodes. Reproductive: No adnexal masses. Other: No free fluid, fluid collection, or free air. Similar coarse calcification in the right lower quadrant mesentery. Multiple additional subcentimeter calcified foci within the left upper quadrant mesentery and aortocaval region,  which may represent calcified lymph nodes. Musculoskeletal: No acute or abnormal lytic or blastic osseous lesions. IMPRESSION: 1. A few dilated loops of lower abdominal small bowel with gradual luminal tapering in the right lower quadrant, which may represent ileus or partial small bowel obstruction. No abrupt caliber transition. 2. Distended urinary bladder may reflect urinary retention. 3. Reported appendectomy, however there is a right lower quadrant tubular  structure adjacent to the cecum, which may represent a remnant appendiceal stump. 4.  Aortic Atherosclerosis (ICD10-I70.0). Electronically Signed   By: Agustin Cree M.D.   On: 10/22/2023 13:17      Assessment/Plan 62 yo female with a history of Hartman procedure and later colostomy reversal in 2016, presenting wit nausea, vomiting and abdominal pain. She has continued to have bowel function, and CT scan shows mildly dilated small bowel with a gradual transition to normal bowel. This may be more enteritis rather than a bowel obstruction. Will proceed with a gastrografin challenge, if there is passage of contrast into the colon on follow up XR, plan for NG removal and diet advancement in the morning. No indication for acute surgical intervention. Surgery will follow.   Sophronia Simas, MD [facility] Surgery General, Hepatobiliary and Pancreatic Surgery 10/22/23 7:44 PM

## 2023-10-23 ENCOUNTER — Observation Stay (HOSPITAL_COMMUNITY): Payer: Medicaid Other

## 2023-10-23 DIAGNOSIS — I10 Essential (primary) hypertension: Secondary | ICD-10-CM | POA: Diagnosis not present

## 2023-10-23 DIAGNOSIS — K5651 Intestinal adhesions [bands], with partial obstruction: Secondary | ICD-10-CM | POA: Diagnosis not present

## 2023-10-23 DIAGNOSIS — D539 Nutritional anemia, unspecified: Secondary | ICD-10-CM | POA: Diagnosis not present

## 2023-10-23 DIAGNOSIS — E876 Hypokalemia: Secondary | ICD-10-CM | POA: Diagnosis not present

## 2023-10-23 LAB — CBC
HCT: 32.5 % — ABNORMAL LOW (ref 36.0–46.0)
Hemoglobin: 10 g/dL — ABNORMAL LOW (ref 12.0–15.0)
MCH: 21.6 pg — ABNORMAL LOW (ref 26.0–34.0)
MCHC: 30.8 g/dL (ref 30.0–36.0)
MCV: 70.3 fL — ABNORMAL LOW (ref 80.0–100.0)
Platelets: 206 10*3/uL (ref 150–400)
RBC: 4.62 MIL/uL (ref 3.87–5.11)
RDW: 14 % (ref 11.5–15.5)
WBC: 6.1 10*3/uL (ref 4.0–10.5)
nRBC: 0 % (ref 0.0–0.2)

## 2023-10-23 LAB — BASIC METABOLIC PANEL
Anion gap: 9 (ref 5–15)
BUN: 6 mg/dL — ABNORMAL LOW (ref 8–23)
CO2: 23 mmol/L (ref 22–32)
Calcium: 8.8 mg/dL — ABNORMAL LOW (ref 8.9–10.3)
Chloride: 108 mmol/L (ref 98–111)
Creatinine, Ser: 0.77 mg/dL (ref 0.44–1.00)
GFR, Estimated: >60 mL/min (ref >60)
Glucose, Bld: 87 mg/dL (ref 70–99)
Potassium: 3.5 mmol/L (ref 3.5–5.1)
Sodium: 140 mmol/L (ref 135–145)

## 2023-10-23 MED ORDER — AMLODIPINE BESYLATE 5 MG PO TABS
5.0000 mg | ORAL_TABLET | Freq: Every day | ORAL | Status: DC
  Administered 2023-10-23: 5 mg via ORAL
  Filled 2023-10-23: qty 1

## 2023-10-23 NOTE — Discharge Summary (Signed)
Name: [name] [name] MRN: [id number] DOB: [birth year] 61 y.o. PCP: Rana Snare, DO  Date of Admission: 10/22/2023 11:25 AM Date of Discharge: 10/23/23 Attending Physician: Dr. Criselda Peaches  Discharge Diagnosis: Principal Problem:   Partial bowel obstruction [facility]) Active Problems:   SBO (small bowel obstruction) (HCC)    Discharge Medications: Allergies as of 10/23/2023   No Known Allergies      Medication List     TAKE these medications    acetaminophen 500 MG tablet Commonly known as: TYLENOL Take 1,000 mg by mouth every 6 (six) hours as needed for headache or moderate pain.   amLODipine 5 MG tablet Commonly known as: NORVASC Take 1 tablet (5 mg total) by mouth daily.   donepezil 5 MG tablet Commonly known as: ARICEPT Take 1 tablet (5 mg total) by mouth at bedtime.   pantoprazole 40 MG tablet Commonly known as: Protonix Take 1 tablet (40 mg total) by mouth daily.        Disposition and follow-up:   [name] was discharged from [facility] in Stable condition.  At the hospital follow up visit please address:  1.  Follow-up:  a. SBO: check on symptoms of abdominal pain, n/v. See how she is tolerating po intake    b. HTN: continued home amlodipine 5mg . Adjust as needed   c. Microcytic anemia: consider iron panel, refer for age appropriate cancer screening  2.  Labs / imaging needed at time of follow-up: None  3.  Pending labs/ test needing follow-up: None  4.  Medication Changes: None  Follow-up Appointments: [facility], date TBD   Hospital Course by problem list:   #Small bowel obstruction #s/p bowel perforation with colostomy and reversal in 2016 Patient presented with acute abdominal pain and nausea with multiple episodes of nonbloody vomiting for one day. History significant for bowel perforation in 2016 s/p colostomy and reversal. In the ED found to have small bowel obstruction vs ileus on CTAP. Patient remained hemodynamically stable and  afebrile. On exam patient was in some discomfort, nausea and pain managed with zofran and toradol. An NG tube was placed to suction and patient placed on NPO status. General surgery team was consulted, who did not recommend surgery. A gastrografin study showed contrast reaching the colon. On hospital day 1 the NG tube was clamped and patient placed on a liquid diet, which she tolerated well. She was discharged on 10/23/23 in stable condition.   #Hypokalemia 3.3 at admission, in setting of acute GI losses. Repleted with IV Kcl. Improved to 3.5 on hospital day 1   #Microcytic anemia Hgb 11.3 with MCV 69. Appears to be a chronic issue for her, actually above baseline Hgb. Will need outpatient follow up.    #HTN On amlodipine 5mg  at home. Antihypertensives were initially held, restarted on hospital day 1.    Discharge Subjective: Patient feeling well today. Denies nausea or vomiting, has had multiple formed bowel movements today. She feels well enough to try eating. She would like to go home today if possible. Informed of plan to clamp tube and trial eating, and that surgery does not want to perform surgery. Patient was amenable to plan.   Discharge Exam:   BP (!) 146/76 (BP Location: Left Arm)   Pulse 60   Temp 98.1 F (36.7 C) (Oral)   Resp 16   Wt 32.3 kg   SpO2 99%   BMI 15.41 kg/m  Constitutional: well-appearing, in no acute distress HENT: normocephalic atraumatic, mucous membranes moist  Eyes: conjunctiva non-erythematous Neck: supple Cardiovascular: regular rate and rhythm, no m/r/g Pulmonary/Chest: normal work of breathing on room air, lungs clear to auscultation bilaterally Abdominal: mildly tender to palpation, nondistended, normoactive bowel sounds MSK: normal bulk and tone Neurological: alert & oriented x 3, 5/5 strength in bilateral upper and lower extremities, normal gait Skin: warm and dry  Pertinent Labs, Studies, and Procedures:     Latest Ref Rng & Units 10/23/2023     8:24 AM 10/22/2023   11:54 AM 03/25/2022   11:37 AM  CBC  WBC 4.0 - 10.5 K/uL 6.1  12.6  5.1   Hemoglobin 12.0 - 15.0 g/dL 57.8  46.9  9.7   Hematocrit 36.0 - 46.0 % 32.5  34.5  31.2   Platelets 150 - 400 K/uL 206  195  245        Latest Ref Rng & Units 10/23/2023    8:24 AM 10/22/2023   11:54 AM 10/01/2022   11:14 AM  CMP  Glucose 70 - 99 mg/dL 87  629  87   BUN 8 - 23 mg/dL 6  10  6    Creatinine 0.44 - 1.00 mg/dL 5.28  4.13  2.44   Sodium 135 - 145 mmol/L 140  135  140   Potassium 3.5 - 5.1 mmol/L 3.5  3.3  3.4   Chloride 98 - 111 mmol/L 108  97  97   CO2 22 - 32 mmol/L 23  29  29    Calcium 8.9 - 10.3 mg/dL 8.8  9.1  9.4   Total Protein 6.5 - 8.1 g/dL  7.0    Total Bilirubin <1.2 mg/dL  0.5    Alkaline Phos 38 - 126 U/L  96    AST 15 - 41 U/L  13    ALT 0 - 44 U/L  6      DG Abd Portable 1V-Small Bowel Obstruction Protocol-initial, 8 hr delay  Result Date: 10/23/2023 CLINICAL DATA:  Small bowel obstruction EXAM: PORTABLE ABDOMEN - 1 VIEW COMPARISON:  Yesterday FINDINGS: Shortening of the enteric tube, tip at the stomach and side port near the GE junction. Contrast is seen within small bowel and especially within colon, reaching the rectum. No concerning mass effect or calcification. IMPRESSION: 1. Shorter enteric tube, tip at the stomach and side port at the GE junction. 2. Enteric contrast has reached the is nondilated colon and rectum. Electronically Signed   By: Tiburcio Pea M.D.   On: 10/23/2023 04:35   DG Abd Portable 1V-Small Bowel Protocol-Position Verification  Result Date: 10/22/2023 CLINICAL DATA:  Nasogastric tube EXAM: PORTABLE ABDOMEN - 1 VIEW COMPARISON:  Abdominal x-ray 03/19/2021 FINDINGS: Nasogastric tube tip is in the distal body of the stomach. IMPRESSION: Nasogastric tube tip is in the distal body of the stomach. Electronically Signed   By: Darliss Cheney M.D.   On: 10/22/2023 18:15   CT ABDOMEN PELVIS W CONTRAST  Result Date: 10/22/2023 CLINICAL DATA:   History of colostomy and reversal presenting with acute onset left-sided abdominal pain and vomiting EXAM: CT ABDOMEN AND PELVIS WITH CONTRAST TECHNIQUE: Multidetector CT imaging of the abdomen and pelvis was performed using the standard protocol following bolus administration of intravenous contrast. RADIATION DOSE REDUCTION: This exam was performed according to the departmental dose-optimization program which includes automated exposure control, adjustment of the mA and/or kV according to patient size and/or use of iterative reconstruction technique. CONTRAST:  OMNIPAQUE IOHEXOL 300 MG/ML  SOLN COMPARISON:  CT abdomen and pelvis dated  08/18/2021 FINDINGS: Lower chest: No focal consolidation or pulmonary nodule in the lung bases. No pleural effusion or pneumothorax demonstrated. Partially imaged heart size is normal. Hepatobiliary: No focal hepatic lesions. No intra or extrahepatic biliary ductal dilation. Normal gallbladder. Pancreas: No focal lesions or main ductal dilation. Spleen: Normal in size.  Similar coarse calcifications. Adrenals/Urinary Tract: No adrenal nodules. No suspicious renal mass, calculi or hydronephrosis. Distended urinary bladder. Stomach/Bowel: Normal appearance of the stomach. Postsurgical changes of the sigmoid colon. Anastomosis is patent. A few dilated loops of lower abdominal small bowel with gradual luminal tapering in the right lower quadrant. No abrupt caliber transition. Moderate volume stool throughout the ascending and transverse colon. Reported appendectomy, however there is a right lower quadrant tubular structure adjacent to the cecum (5:40). Vascular/Lymphatic: Aortic atherosclerosis. No enlarged abdominal or pelvic lymph nodes. Reproductive: No adnexal masses. Other: No free fluid, fluid collection, or free air. Similar coarse calcification in the right lower quadrant mesentery. Multiple additional subcentimeter calcified foci within the left upper quadrant mesentery  and aortocaval region, which may represent calcified lymph nodes. Musculoskeletal: No acute or abnormal lytic or blastic osseous lesions. IMPRESSION: 1. A few dilated loops of lower abdominal small bowel with gradual luminal tapering in the right lower quadrant, which may represent ileus or partial small bowel obstruction. No abrupt caliber transition. 2. Distended urinary bladder may reflect urinary retention. 3. Reported appendectomy, however there is a right lower quadrant tubular structure adjacent to the cecum, which may represent a remnant appendiceal stump. 4.  Aortic Atherosclerosis (ICD10-I70.0). Electronically Signed   By: Agustin Cree M.D.   On: 10/22/2023 13:17   Signed: Monna Fam, MD PGY-1 10/23/2023, 9:47 AM   Pager: [phone]

## 2023-10-23 NOTE — Progress Notes (Signed)
Subjective:   Summary: [name] [name] is a 62 y.o. person living with a history of bowel perforation s/p colostomy/colectomy s/p reversal in 2016, who presented with abdominal pain and nausea and admitted for small bowel obstruction.   Hospital Day 1  Overnight Events: None  Subjective:  Patient feeling well today. Denies nausea or vomiting, has had multiple formed bowel movements today. She feels well enough to try eating. She would like to go home today if possible. Informed of plan to clamp tube and trial eating, and that surgery does not want to perform surgery. Patient was amenable to plan.    Objective:  Vital signs in last 24 hours: Vitals:   10/23/23 0052 10/23/23 0500 10/23/23 0534 10/23/23 0755  BP: (!) 153/82  (!) 162/85 (!) 146/76  Pulse: 64  60 60  Resp: 17  17 16   Temp: 98.1 F (36.7 C)  98.3 F (36.8 C) 98.1 F (36.7 C)  TempSrc: Oral  Oral Oral  SpO2: 98%  99% 99%  Weight:  32.3 kg     Supplemental O2: Room Air SpO2: 99 %   Physical Exam:  Constitutional: well-appearing, in no acute distress Cardiovascular: RRR, no murmurs, rubs or gallops Pulmonary/Chest: normal work of breathing on room air, lungs clear to auscultation bilaterally Abdominal: soft, mildly tender to deep palpation. Active bowel sounds Neuro: 5/5 strength, no focal deficit noted Skin: warm and dry Extremities: upper/lower extremity pulses 2+, no lower extremity edema present   Intake/Output Summary (Last 24 hours) at 10/23/2023 0821 Last data filed at 10/23/2023 0500 Gross per 24 hour  Intake 1858.84 ml  Output 200 ml  Net 1658.84 ml   Net IO Since Admission: 1,658.84 mL [10/23/23 0821]  Pertinent Labs:    Latest Ref Rng & Units 10/22/2023   11:54 AM 03/25/2022   11:37 AM 08/17/2021    3:58 PM  CBC  WBC 4.0 - 10.5 K/uL 12.6  5.1  7.6   Hemoglobin 12.0 - 15.0 g/dL 72.5  9.7  36.6   Hematocrit 36.0 - 46.0 % 34.5  31.2  40.3   Platelets 150 - 400 K/uL 195  245  240         Latest Ref Rng & Units 10/22/2023   11:54 AM 10/01/2022   11:14 AM 03/25/2022   11:37 AM  CMP  Glucose 70 - 99 mg/dL 440  87  84   BUN 8 - 23 mg/dL 10  6  10    Creatinine 0.44 - 1.00 mg/dL 3.47  4.25  9.56   Sodium 135 - 145 mmol/L 135  140  141   Potassium 3.5 - 5.1 mmol/L 3.3  3.4  2.9   Chloride 98 - 111 mmol/L 97  97  102   CO2 22 - 32 mmol/L 29  29  32   Calcium 8.9 - 10.3 mg/dL 9.1  9.4  9.0   Total Protein 6.5 - 8.1 g/dL 7.0   6.8   Total Bilirubin <1.2 mg/dL 0.5   0.5   Alkaline Phos 38 - 126 U/L 96   91   AST 15 - 41 U/L 13   14   ALT 0 - 44 U/L 6   8     Imaging: DG Abd Portable 1V-Small Bowel Obstruction Protocol-initial, 8 hr delay  Result Date: 10/23/2023 CLINICAL DATA:  Small bowel obstruction EXAM: PORTABLE ABDOMEN - 1 VIEW COMPARISON:  Teacher, English as a foreign language  FINDINGS: Shortening of the enteric tube, tip at the stomach and side port near the GE junction. Contrast is seen within small bowel and especially within colon, reaching the rectum. No concerning mass effect or calcification. IMPRESSION: 1. Shorter enteric tube, tip at the stomach and side port at the GE junction. 2. Enteric contrast has reached the is nondilated colon and rectum. Electronically Signed   By: Tiburcio Pea M.D.   On: 10/23/2023 04:35   DG Abd Portable 1V-Small Bowel Protocol-Position Verification  Result Date: 10/22/2023 CLINICAL DATA:  Nasogastric tube EXAM: PORTABLE ABDOMEN - 1 VIEW COMPARISON:  Abdominal x-ray 03/19/2021 FINDINGS: Nasogastric tube tip is in the distal body of the stomach. IMPRESSION: Nasogastric tube tip is in the distal body of the stomach. Electronically Signed   By: Darliss Cheney M.D.   On: 10/22/2023 18:15   CT ABDOMEN PELVIS W CONTRAST  Result Date: 10/22/2023 CLINICAL DATA:  History of colostomy and reversal presenting with acute onset left-sided abdominal pain and vomiting EXAM: CT ABDOMEN AND PELVIS WITH CONTRAST TECHNIQUE: Multidetector CT imaging of the abdomen and  pelvis was performed using the standard protocol following bolus administration of intravenous contrast. RADIATION DOSE REDUCTION: This exam was performed according to the departmental dose-optimization program which includes automated exposure control, adjustment of the mA and/or kV according to patient size and/or use of iterative reconstruction technique. CONTRAST:  OMNIPAQUE IOHEXOL 300 MG/ML  SOLN COMPARISON:  CT abdomen and pelvis dated 08/18/2021 FINDINGS: Lower chest: No focal consolidation or pulmonary nodule in the lung bases. No pleural effusion or pneumothorax demonstrated. Partially imaged heart size is normal. Hepatobiliary: No focal hepatic lesions. No intra or extrahepatic biliary ductal dilation. Normal gallbladder. Pancreas: No focal lesions or main ductal dilation. Spleen: Normal in size.  Similar coarse calcifications. Adrenals/Urinary Tract: No adrenal nodules. No suspicious renal mass, calculi or hydronephrosis. Distended urinary bladder. Stomach/Bowel: Normal appearance of the stomach. Postsurgical changes of the sigmoid colon. Anastomosis is patent. A few dilated loops of lower abdominal small bowel with gradual luminal tapering in the right lower quadrant. No abrupt caliber transition. Moderate volume stool throughout the ascending and transverse colon. Reported appendectomy, however there is a right lower quadrant tubular structure adjacent to the cecum (5:40). Vascular/Lymphatic: Aortic atherosclerosis. No enlarged abdominal or pelvic lymph nodes. Reproductive: No adnexal masses. Other: No free fluid, fluid collection, or free air. Similar coarse calcification in the right lower quadrant mesentery. Multiple additional subcentimeter calcified foci within the left upper quadrant mesentery and aortocaval region, which may represent calcified lymph nodes. Musculoskeletal: No acute or abnormal lytic or blastic osseous lesions. IMPRESSION: 1. A few dilated loops of lower abdominal small  bowel with gradual luminal tapering in the right lower quadrant, which may represent ileus or partial small bowel obstruction. No abrupt caliber transition. 2. Distended urinary bladder may reflect urinary retention. 3. Reported appendectomy, however there is a right lower quadrant tubular structure adjacent to the cecum, which may represent a remnant appendiceal stump. 4.  Aortic Atherosclerosis (ICD10-I70.0). Electronically Signed   By: Agustin Cree M.D.   On: 10/22/2023 13:17    Assessment/Plan:   Principal Problem:   Partial bowel obstruction (HCC) Active Problems:   SBO (small bowel obstruction) (HCC)  #Small bowel obstruction #s/p bowel perforation with colostomy and reversal in 2016 Patient presenting with acute abdominal pain and n/v, found to have small bowel obstruction vs ileus on CTAP. Patient is HDS, nausea and vomiting have subsided with zofran and IV morphine. Pain medication transitioned  to prn toradol to avoid opiates which may worsen ileus.  - General surgery consulted, no intervention indicated. Recommending trial food, can remove tube if all goes well - Gastrografin study shows contrast reaching the colon - NG tube clamped - Clear liquids as tolerated - Toradol 30mg  q8 prn, trying to avoid opiates   #Hypokalemia 3.3 at admission, in setting of acute GI losses. Continue to monitor with daily labs, replete as necessary   #Microcytic anemia Hgb 11.3 with MCV 69. Appears to be a chronic issue for her, actually above baseline Hgb. Will need outpatient follow up.    #HTN On amlodipine 5mg  at home. Restarted as BP has been in 150s-160s systolic   Diet: Clear liquids VTE: Heparin Code: Full  Dispo: Anticipated discharge to Home pending medical stability.   Monna Fam, MD PGY-1 Internal Medicine Resident Pager Number [phone] Please contact the on call pager after 5 pm and on weekends at [phone].

## 2023-10-23 NOTE — Progress Notes (Signed)
Subjective/Chief Complaint: Patient moving bowels.  No abdominal pain.   Objective: Vital signs in last 24 hours: Temp:  [97.3 F (36.3 C)-98.7 F (37.1 C)] 98.1 F (36.7 C) (12/01 0755) Pulse Rate:  [59-74] 60 (12/01 0755) Resp:  [13-17] 16 (12/01 0755) BP: (122-162)/(69-90) 146/76 (12/01 0755) SpO2:  [94 %-100 %] 99 % (12/01 0755) Weight:  [32.3 kg] 32.3 kg (12/01 0500) Last BM Date : 10/22/23  Intake/Output from previous day: 11/30 0701 - 12/01 0700 In: 1858.8 [I.V.:1638.8; NG/GT:120; IV Piggyback:100] Out: 200 [Emesis/NG output:200] Intake/Output this shift: No intake/output data recorded.  Abdomen: Soft nontender without rebound guarding.  No distention.  Scar noted.  No hernia.  Lab Results:  Recent Labs    10/22/23 1154  WBC 12.6*  HGB 11.3*  HCT 34.5*  PLT 195   BMET Recent Labs    10/22/23 1154  NA 135  K 3.3*  CL 97*  CO2 29  GLUCOSE 121*  BUN 10  CREATININE 0.56  CALCIUM 9.1   PT/INR No results for input(s): "LABPROT", "INR" in the last 72 hours. ABG No results for input(s): "PHART", "HCO3" in the last 72 hours.  Invalid input(s): "PCO2", "PO2"  Studies/Results: DG Abd Portable 1V-Small Bowel Obstruction Protocol-initial, 8 hr delay  Result Date: 10/23/2023 CLINICAL DATA:  Small bowel obstruction EXAM: PORTABLE ABDOMEN - 1 VIEW COMPARISON:  Yesterday FINDINGS: Shortening of the enteric tube, tip at the stomach and side port near the GE junction. Contrast is seen within small bowel and especially within colon, reaching the rectum. No concerning mass effect or calcification. IMPRESSION: 1. Shorter enteric tube, tip at the stomach and side port at the GE junction. 2. Enteric contrast has reached the is nondilated colon and rectum. Electronically Signed   By: Tiburcio Pea M.D.   On: 10/23/2023 04:35   DG Abd Portable 1V-Small Bowel Protocol-Position Verification  Result Date: 10/22/2023 CLINICAL DATA:  Nasogastric tube EXAM: PORTABLE  ABDOMEN - 1 VIEW COMPARISON:  Abdominal x-ray 03/19/2021 FINDINGS: Nasogastric tube tip is in the distal body of the stomach. IMPRESSION: Nasogastric tube tip is in the distal body of the stomach. Electronically Signed   By: Darliss Cheney M.D.   On: 10/22/2023 18:15   CT ABDOMEN PELVIS W CONTRAST  Result Date: 10/22/2023 CLINICAL DATA:  History of colostomy and reversal presenting with acute onset left-sided abdominal pain and vomiting EXAM: CT ABDOMEN AND PELVIS WITH CONTRAST TECHNIQUE: Multidetector CT imaging of the abdomen and pelvis was performed using the standard protocol following bolus administration of intravenous contrast. RADIATION DOSE REDUCTION: This exam was performed according to the departmental dose-optimization program which includes automated exposure control, adjustment of the mA and/or kV according to patient size and/or use of iterative reconstruction technique. CONTRAST:  OMNIPAQUE IOHEXOL 300 MG/ML  SOLN COMPARISON:  CT abdomen and pelvis dated 08/18/2021 FINDINGS: Lower chest: No focal consolidation or pulmonary nodule in the lung bases. No pleural effusion or pneumothorax demonstrated. Partially imaged heart size is normal. Hepatobiliary: No focal hepatic lesions. No intra or extrahepatic biliary ductal dilation. Normal gallbladder. Pancreas: No focal lesions or main ductal dilation. Spleen: Normal in size.  Similar coarse calcifications. Adrenals/Urinary Tract: No adrenal nodules. No suspicious renal mass, calculi or hydronephrosis. Distended urinary bladder. Stomach/Bowel: Normal appearance of the stomach. Postsurgical changes of the sigmoid colon. Anastomosis is patent. A few dilated loops of lower abdominal small bowel with gradual luminal tapering in the right lower quadrant. No abrupt caliber transition. Moderate volume stool throughout the  ascending and transverse colon. Reported appendectomy, however there is a right lower quadrant tubular structure adjacent to the cecum  (5:40). Vascular/Lymphatic: Aortic atherosclerosis. No enlarged abdominal or pelvic lymph nodes. Reproductive: No adnexal masses. Other: No free fluid, fluid collection, or free air. Similar coarse calcification in the right lower quadrant mesentery. Multiple additional subcentimeter calcified foci within the left upper quadrant mesentery and aortocaval region, which may represent calcified lymph nodes. Musculoskeletal: No acute or abnormal lytic or blastic osseous lesions. IMPRESSION: 1. A few dilated loops of lower abdominal small bowel with gradual luminal tapering in the right lower quadrant, which may represent ileus or partial small bowel obstruction. No abrupt caliber transition. 2. Distended urinary bladder may reflect urinary retention. 3. Reported appendectomy, however there is a right lower quadrant tubular structure adjacent to the cecum, which may represent a remnant appendiceal stump. 4.  Aortic Atherosclerosis (ICD10-I70.0). Electronically Signed   By: Agustin Cree M.D.   On: 10/22/2023 13:17    Anti-infectives: Anti-infectives (From admission, onward)    None       Assessment/Plan: Enteritis versus partial small bowel obstruction.  Postcontrast films show contrast in colon.  No small bowel dilation she having bowel meds.  DC NG tube, advance diet.  As long as she tolerates her diet she will be discharged home.  Surgery will sign off at this point in time.   LOS: 0 days    Dortha Schwalbe MD 10/23/2023 Moderate complexity

## 2023-10-23 NOTE — Discharge Instructions (Signed)
[name] [name], [name] [name] were hospitalized for an obstruction in your intestines. At this time your pain and nausea are much better, and I feel comfortable discharging you from the hospital. Thank you for allowing [place] to be part of your care.   We arranged for you to follow up at the [facility] Internal Medicine clinic. We will call you to schedule this appointment.   I am not making any changes to your medications at this time.   Please make sure to return to the hospital if you have any worsening nausea, vomiting, or pain.   Please call our clinic if you have any questions or concerns, we may be able to help and keep you from a long and expensive emergency room wait. Our clinic and after hours phone number is [phone], the best time to call is Monday through Friday 9 am to 4 pm but there is always someone available 24/7 if you have an emergency. If you need medication refills please notify your pharmacy one week in advance and they will send [place] a request.

## 2023-11-11 ENCOUNTER — Ambulatory Visit: Payer: Medicaid Other | Admitting: Student

## 2023-11-11 ENCOUNTER — Encounter: Payer: Self-pay | Admitting: Student

## 2023-11-11 VITALS — BP 128/78 | HR 81 | Temp 99.0°F | Ht <= 58 in | Wt 74.2 lb

## 2023-11-11 DIAGNOSIS — R1012 Left upper quadrant pain: Secondary | ICD-10-CM | POA: Diagnosis not present

## 2023-11-11 DIAGNOSIS — K5669 Other partial intestinal obstruction: Secondary | ICD-10-CM | POA: Diagnosis not present

## 2023-11-11 DIAGNOSIS — I10 Essential (primary) hypertension: Secondary | ICD-10-CM | POA: Diagnosis present

## 2023-11-11 DIAGNOSIS — D509 Iron deficiency anemia, unspecified: Secondary | ICD-10-CM | POA: Diagnosis not present

## 2023-11-11 DIAGNOSIS — K5651 Intestinal adhesions [bands], with partial obstruction: Secondary | ICD-10-CM

## 2023-11-11 DIAGNOSIS — K2950 Unspecified chronic gastritis without bleeding: Secondary | ICD-10-CM

## 2023-11-11 DIAGNOSIS — Z0000 Encounter for general adult medical examination without abnormal findings: Secondary | ICD-10-CM

## 2023-11-11 MED ORDER — PANTOPRAZOLE SODIUM 40 MG PO TBEC: 40.0000 mg | DELAYED_RELEASE_TABLET | Freq: Every day | ORAL | 3 refills | Status: AC

## 2023-11-11 NOTE — Assessment & Plan Note (Signed)
Stable.  BP at goal 128/78.  Taking amlodipine 5 mg daily.  No changes today.  Plan -Continue amlodipine 5 mg

## 2023-11-11 NOTE — Assessment & Plan Note (Signed)
-  Patient requested to postpone mammogram until after New Year's.

## 2023-11-11 NOTE — Assessment & Plan Note (Signed)
Recently hospitalized from 11/30 - 12/1 for partial SBO.  Symptoms of abdominal pain, nausea, vomiting improved during course.  Tolerating p.o. intake and having BM.  Today's visit patient reports tolerating p.o. intake and having daily BM.  Still has some left-sided abdominal pain which is chronic in nature. Abdominal exam stable.  She takes Tylenol as needed for pain, encouraged her to continue PRN.

## 2023-11-11 NOTE — Assessment & Plan Note (Addendum)
Chronic and stable from recent labs.  Question on nutritional deficiencies due to history of multiple abdominal surgeries. Evaluate at next OV for iron deficiency, future lab orders placed today.

## 2023-11-11 NOTE — Patient Instructions (Addendum)
Thank you, [name] for allowing [place] to provide your care today. Today we discussed your recent hospital visit.  -Glad you are feeling better! -Blood pressure normal today. Continue taking amlodipine once a day.  -Try tylenol 1000 mg up to three times a day if needed for pain  -Refilled your Protonix today. Take it once a day.  -Try using a pill container to ensure you are taking the medications daily.   -Schedule appointment with GI doctor, here is the phone number: [facility]: [phone] (option 1)  I have ordered the following medication/changed the following medications:   Stop the following medications: Medications Discontinued During This Encounter  Medication Reason   pantoprazole (PROTONIX) 40 MG tablet Reorder     Start the following medications: Meds ordered this encounter  Medications   pantoprazole (PROTONIX) 40 MG tablet    Sig: Take 1 tablet (40 mg total) by mouth daily.    Dispense:  90 tablet    Refill:  3     Follow up:  2-3 months    Remember: Should you have any questions or concerns please call the internal medicine clinic at [phone].    Rana Snare, D.O. [facility]

## 2023-11-11 NOTE — Assessment & Plan Note (Addendum)
Chronic left-sided abdominal pain in the setting of multiple abdominal surgeries.  Recently in the hospital for partial SBO.  States feeling better after discharge.  Pending re-establishing with outpatient GI.  Appears [facility] has contacted patient to schedule appt, provided phone number for GI to patient's daughter to call and schedule appt.  Plan -Pending follow-up with GI  -Refilled Protonix 40 mg daily -Continue Dulcolax as needed for constipation

## 2023-11-11 NOTE — Progress Notes (Signed)
CC: hospital f/u  HPI:  [name].[name] [name] is a 62 y.o. female living with a history stated below and presents today for hospital f/u. Please see problem based assessment and plan for additional details.  Presents today for hospital f/u after patient admitted on 11/30-12/1 for abdominal pain, n/v due to partial SBO.   In person interpreter and patient's daughter present during encounter.  Past Medical History:  Diagnosis Date   Anemia 07/27/2016   Annual physical exam 06/27/2020   Atypical chest pain 08/07/2020   Clotting disorder (HCC)    Colostomy stricture (HCC) 07/02/2015   Constipation    Cough 08/07/2020   Depression    Diarrhea 11/13/2020   Acute watery diarrhea starting 12/14 associated with abdominal pain, headache, feeling "hot and cold".    Difficulty sleeping    Encounter for completion of form with patient 10/23/2021   Headache 07/18/2018   Hypokalemia 06/27/2020   Influenza vaccine administered 08/06/2021   Literacy level of illiterate 10/01/2016   Less than one year of formal education in [place].  [name] [name] is taking classes for reading and writing in [place].    S/P colostomy [facility]) 03/02/2015   March 2016: colon perforation following screening colonoscopy    S/P colostomy [facility]) 03/02/2015   March 2016: colon perforation following screening colonoscopy    SBO (small bowel obstruction) (HCC) 03/18/2021    Current Outpatient Medications on File Prior to Visit  Medication Sig Dispense Refill   acetaminophen (TYLENOL) 500 MG tablet Take 1,000 mg by mouth every 6 (six) hours as needed for headache or moderate pain.     amLODipine (NORVASC) 5 MG tablet Take 1 tablet (5 mg total) by mouth daily. 90 tablet 1   donepezil (ARICEPT) 5 MG tablet Take 1 tablet (5 mg total) by mouth at bedtime. 30 tablet 11   No current facility-administered medications on file prior to visit.    Family History  Problem Relation Age of Onset   Colon cancer Neg Hx    Rectal cancer Neg Hx     Stomach cancer Neg Hx     Social History   Socioeconomic History   Marital status: Married    Spouse name: Not on file   Number of children: 2   Years of education: 0   Highest education level: Not on file  Occupational History   Occupation: ENVIRONMENTAL SERVICES    Employer: [employer]  Tobacco Use   Smoking status: Every Day    Current packs/day: 0.50    Average packs/day: 0.5 packs/day for 37.0 years (18.5 ttl pk-yrs)    Types: Cigarettes   Smokeless tobacco: Never  Vaping Use   Vaping status: Never Used  Substance and Sexual Activity   Alcohol use: No    Alcohol/week: 0.0 standard drinks of alcohol   Drug use: No   Sexual activity: Not Currently  Other Topics Concern   Not on file  Social History Narrative   Lives with husband, daughter & her daughter's boyfriend & her grandchildren.   Illiterate in any language.   Less than one year of formal education in [place].    Work: [employer]   From: [place] but speaks English okay    Social Drivers of Corporate investment banker Strain: Low Risk  (03/29/2023)   Overall Financial Resource Strain (CARDIA)    Difficulty of Paying Living Expenses: Not hard at all  Food Insecurity: No Food Insecurity (10/22/2023)   Hunger Vital Sign    Worried  About Running Out of Food in the Last Year: Never true    Ran Out of Food in the Last Year: Never true  Transportation Needs: No Transportation Needs (10/22/2023)   PRAPARE - Administrator, Civil Service (Medical): No    Lack of Transportation (Non-Medical): No  Physical Activity: Not on file  Stress: No Stress Concern Present (03/29/2023)   [employer] of Occupational Health - Occupational Stress Questionnaire    Feeling of Stress : Not at all  Social Connections: Moderately Integrated (03/29/2023)   Social Connection and Isolation Panel [NHANES]    Frequency of Communication with Friends and Family: More than three times a week    Frequency of Social  Gatherings with Friends and Family: More than three times a week    Attends Religious Services: More than 4 times per year    Active Member of [employer] or Organizations: No    Attends Banker Meetings: Never    Marital Status: Married  Catering manager Violence: Not At Risk (10/22/2023)   Humiliation, Afraid, Rape, and Kick questionnaire    Fear of Current or Ex-Partner: No    Emotionally Abused: No    Physically Abused: No    Sexually Abused: No    Review of Systems: ROS negative except for what is noted on the assessment and plan.  Vitals:   11/11/23 0849  BP: 128/78  Pulse: 81  Temp: 99 F (37.2 C)  TempSrc: Oral  SpO2: 100%  Weight: 74 lb 3.2 oz (33.7 kg)  Height: 4\' 9"  (1.448 m)   Physical Exam: Constitutional: Well-appearing female sitting in chair comfortably, in no acute distress Cardiovascular: Regular rate and rhythm Pulmonary/Chest: Normal work of breathing on room air Abdominal: Bowel sounds present, soft, nondistended, mild TTP with LUQ and LLQ which is chronic in nature, no guarding or rebound, chronic surgical changes Neurological: alert & oriented x 3 Psych: Normal mood and affect  Assessment & Plan:   HTN (hypertension) Stable.  BP at goal 128/78.  Taking amlodipine 5 mg daily.  No changes today.  Plan -Continue amlodipine 5 mg  Partial bowel obstruction [facility]) Recently hospitalized from 11/30 - 12/1 for partial SBO.  Symptoms of abdominal pain, nausea, vomiting improved during course.  Tolerating p.o. intake and having BM.  Today's visit patient reports tolerating p.o. intake and having daily BM.  Still has some left-sided abdominal pain which is chronic in nature. Abdominal exam stable.  She takes Tylenol as needed for pain, encouraged her to continue PRN.   Healthcare maintenance -Patient requested to postpone mammogram until after New Year's.  Abdominal pain Chronic left-sided abdominal pain in the setting of multiple abdominal  surgeries.  Recently in the hospital for partial SBO.  States feeling better after discharge.  Pending re-establishing with outpatient GI.  Appears [facility] has contacted patient to schedule appt, provided phone number for GI to patient's daughter to call and schedule appt.  Plan -Pending follow-up with GI  -Refilled Protonix 40 mg daily -Continue Dulcolax as needed for constipation  Microcytic anemia Chronic and stable from recent labs.  Question on nutritional deficiencies due to history of multiple abdominal surgeries. Evaluate at next OV for iron deficiency, future lab orders placed today.    Patient discussed with Dr. Docia Furl, D.O. [facility] Internal Medicine, PGY-2 Phone: [phone] Date 11/11/2023 Time 10:25 AM

## 2023-12-09 ENCOUNTER — Ambulatory Visit: Payer: Medicaid Other | Admitting: Student

## 2023-12-09 ENCOUNTER — Encounter: Payer: Self-pay | Admitting: Student

## 2023-12-09 ENCOUNTER — Ambulatory Visit (HOSPITAL_COMMUNITY)
Admission: RE | Admit: 2023-12-09 | Discharge: 2023-12-09 | Disposition: A | Discharge status: Home / Self Care | Payer: Medicaid Other | Source: Ambulatory Visit | Attending: Internal Medicine | Admitting: Internal Medicine

## 2023-12-09 VITALS — BP 133/77 | HR 72 | Temp 98.2°F | Ht <= 58 in | Wt 75.5 lb

## 2023-12-09 DIAGNOSIS — R519 Headache, unspecified: Secondary | ICD-10-CM | POA: Diagnosis not present

## 2023-12-09 DIAGNOSIS — R1030 Lower abdominal pain, unspecified: Secondary | ICD-10-CM | POA: Insufficient documentation

## 2023-12-09 DIAGNOSIS — R109 Unspecified abdominal pain: Secondary | ICD-10-CM

## 2023-12-09 MED ORDER — METHYLPREDNISOLONE SODIUM SUCC 40 MG IJ SOLR: 32.0000 mg | Freq: Once | INTRAMUSCULAR | Status: DC

## 2023-12-09 MED ORDER — PREDNISONE 20 MG PO TABS: 40.0000 mg | ORAL_TABLET | Freq: Every day | ORAL | Status: DC

## 2023-12-09 MED ORDER — PREDNISONE 20 MG PO TABS: 40.0000 mg | ORAL_TABLET | Freq: Every day | ORAL | 0 refills | Status: DC

## 2023-12-09 MED ORDER — PREDNISONE 20 MG PO TABS: 40.0000 mg | ORAL_TABLET | Freq: Every day | ORAL | Status: AC

## 2023-12-09 MED ORDER — IOHEXOL 9 MG/ML PO SOLN
500.0000 mL | ORAL | Status: AC
  Administered 2023-12-09: 1000 mL via ORAL

## 2023-12-09 NOTE — Assessment & Plan Note (Signed)
Patient with concerns of chronic abdominal pain today.  Recently she has had a partial SBO and was in the hospital for this.  Since, she has still having this chronic pain.  She describes it to be a sharp pain.  She states nothing makes it better.  Positional changes does not make it better.  She has tried Advil with no relief.  She denies any nausea, vomiting, diarrhea.  She denies any abdominal distention.  Passing gas has not improved the pain.  On my exam, patient does have diffuse tenderness.  No rebound or guarding.  Unclear what this is.  Could be a seroma or a hernia.  No obvious protrusions.  Do not think this is an acute abdomen.  Do not think this is appendicitis or any concern for pancreatitis or acute mesenteric ischemia.  Plan: -Obtain CT abdomen pelvis  -Follow-up in 4 days

## 2023-12-09 NOTE — Progress Notes (Signed)
CC: Headache and abdominal pain  HPI:  [name].[name] [name] is a 63 y.o. female with a past medical history of hypertension who presents for concerns of abdominal pain and headaches.  Please see assessment and plan for full HPI.   Past Medical History:  Diagnosis Date   Anemia 07/27/2016   Annual physical exam 06/27/2020   Atypical chest pain 08/07/2020   Clotting disorder (HCC)    Colostomy stricture (HCC) 07/02/2015   Constipation    Cough 08/07/2020   Depression    Diarrhea 11/13/2020   Acute watery diarrhea starting 12/14 associated with abdominal pain, headache, feeling "hot and cold".    Difficulty sleeping    Encounter for completion of form with patient 10/23/2021   Headache 07/18/2018   Hypokalemia 06/27/2020   Influenza vaccine administered 08/06/2021   Literacy level of illiterate 10/01/2016   Less than one year of formal education in [place].  [name] [name] is taking classes for reading and writing in [place].    S/P colostomy (HCC) 03/02/2015   March 2016: colon perforation following screening colonoscopy    S/P colostomy [facility]) 03/02/2015   March 2016: colon perforation following screening colonoscopy    SBO (small bowel obstruction) (HCC) 03/18/2021     Current Outpatient Medications:    acetaminophen (TYLENOL) 500 MG tablet, Take 1,000 mg by mouth every 6 (six) hours as needed for headache or moderate pain., Disp: , Rfl:    amLODipine (NORVASC) 5 MG tablet, Take 1 tablet (5 mg total) by mouth daily., Disp: 90 tablet, Rfl: 1   donepezil (ARICEPT) 5 MG tablet, Take 1 tablet (5 mg total) by mouth at bedtime., Disp: 30 tablet, Rfl: 11   pantoprazole (PROTONIX) 40 MG tablet, Take 1 tablet (40 mg total) by mouth daily., Disp: 90 tablet, Rfl: 3  Current Facility-Administered Medications:    methylPREDNISolone sodium succinate (SOLU-MEDROL) 40 mg/mL injection 32 mg, 32 mg, Intramuscular, Once,    [START ON 12/10/2023] predniSONE (DELTASONE) tablet 40 mg, 40 mg, Oral, Q breakfast,    Review of Systems:    Constitutional: Patient endorses headache GI: Patient endorses abdominal pain  Physical Exam:  Vitals:   12/09/23 1020 12/09/23 1118  BP: (!) 143/92 133/77  Pulse: 79 72  Temp: 98.2 F (36.8 C)   TempSrc: Oral   SpO2: 100%   Weight: 75 lb 8 oz (34.2 kg)   Height: 4\' 9"  (1.448 m)    General: Patient is sitting comfortably in the room  HEENT: Tympanic membranes perforated, ear canals clear, no erythema noted in bilateral ear canals.  Nostrils clear.  Poor dentition appreciated.  No abscesses or drainage appreciated in oropharynx.  Temporal artery tenderness appreciated Cardio: Regular rate and rhythm, no murmurs, rubs or gallops Pulmonary: Clear to ausculation bilaterally with no rales, rhonchi, and crackles  Abdomen: Some soft, with diffuse tenderness, no rebound or guarding.  Normal active bowel sounds.  Surgery scars noted, no obvious protrusions appreciated   Assessment & Plan:   Abdominal pain Patient with concerns of chronic abdominal pain today.  Recently she has had a partial SBO and was in the hospital for this.  Since, she has still having this chronic pain.  She describes it to be a sharp pain.  She states nothing makes it better.  Positional changes does not make it better.  She has tried Advil with no relief.  She denies any nausea, vomiting, diarrhea.  She denies any abdominal distention.  Passing gas has not improved the pain.  On  my exam, patient does have diffuse tenderness.  No rebound or guarding.  Unclear what this is.  Could be a seroma or a hernia.  No obvious protrusions.  Do not think this is an acute abdomen.  Do not think this is appendicitis or any concern for pancreatitis or acute mesenteric ischemia.  Plan: -Obtain CT abdomen pelvis  -Follow-up in 4 days  Nonintractable headache Patient reports 1 month history of headaches.  Described to be sharp in nature on the right side of her head.  She denies any lacrimation.  She does  report some rhinorrhea at times.  She denies any vision changes.  She denies any phonophobia or photophobia.  She states nothing helps the pain.  She says it is constant and not episodic.  She has tried Motrin/Advil with no relief.  She has never had a headache like this before.  She does check her blood pressures at times and is elevated into the 140s/150s.  Could be contributing.  On exam, patient does have tenderness to her temporal artery.  Oropharyngeal examination unremarkable, but patient does have some missing teeth.  Does not sound like TMJ type pain.  Does not fit into my differential of cluster headaches/migraine/tension type headache.  Given the temporal artery tenderness, and concern for temporal arteritis.  Did ask about proximal muscle weakness to see if this could be related to polymyalgia rheumatica, but she denies any symptoms given clinical concern we will start patient on steroids.  Plan: -Start prednisone 40 mg daily -Follow-up in 4 days -If resolved we know what the problem is, if does not resolve, we will proceed with imaging  Patient seen with Dr. Marquita Palms, DO PGY-2 Internal Medicine Resident  Pager: [phone]

## 2023-12-09 NOTE — Assessment & Plan Note (Signed)
Patient reports 1 month history of headaches.  Described to be sharp in nature on the right side of her head.  She denies any lacrimation.  She does report some rhinorrhea at times.  She denies any vision changes.  She denies any phonophobia or photophobia.  She states nothing helps the pain.  She says it is constant and not episodic.  She has tried Motrin/Advil with no relief.  She has never had a headache like this before.  She does check her blood pressures at times and is elevated into the 140s/150s.  Could be contributing.  On exam, patient does have tenderness to her temporal artery.  Oropharyngeal examination unremarkable, but patient does have some missing teeth.  Does not sound like TMJ type pain.  Does not fit into my differential of cluster headaches/migraine/tension type headache.  Given the temporal artery tenderness, and concern for temporal arteritis.  Did ask about proximal muscle weakness to see if this could be related to polymyalgia rheumatica, but she denies any symptoms given clinical concern we will start patient on steroids.  Plan: -Start prednisone 40 mg daily -Follow-up in 4 days -If resolved we know what the problem is, if does not resolve, we will proceed with imaging

## 2023-12-09 NOTE — Patient Instructions (Addendum)
[name] [name],Thank you for allowing me to take part in your care today.  Here are your instructions.  1. Regarding you abdominal pain, we are going to send you for a CT scan to see if we can find out why you are having this pain.   2. Regarding your headaches, we are going to test some labs. I am going to start you on some steroids to see if this can help. Come back in 4 days to reassess the headache. Please start taking the prednisone tomorrow as we are going to give you a shot today.   Thank you, Dr. Allena Katz  If you have any other questions please contact the internal medicine clinic at [phone] If it is after hours, please call the [place] [facility] at [phone] and then ask the person who picks up for the resident on call.

## 2023-12-11 LAB — SEDIMENTATION RATE: Sed Rate: 32 mm/h (ref 0–40)

## 2023-12-11 LAB — C-REACTIVE PROTEIN

## 2023-12-13 ENCOUNTER — Ambulatory Visit: Payer: Self-pay | Admitting: Student

## 2023-12-13 ENCOUNTER — Encounter: Payer: Self-pay | Admitting: Student

## 2023-12-13 VITALS — BP 123/78 | HR 74 | Temp 98.3°F | Ht <= 58 in | Wt 75.8 lb

## 2023-12-13 DIAGNOSIS — R109 Unspecified abdominal pain: Secondary | ICD-10-CM | POA: Diagnosis not present

## 2023-12-13 DIAGNOSIS — R519 Headache, unspecified: Secondary | ICD-10-CM | POA: Diagnosis present

## 2023-12-13 MED ORDER — AMITRIPTYLINE HCL 25 MG PO TABS: 25.0000 mg | ORAL_TABLET | Freq: Every day | ORAL | 2 refills | Status: DC

## 2023-12-13 NOTE — Patient Instructions (Addendum)
C?m ?n c Cheyla Barley ? cho php chng ti ch?m [place] cho b?n ngy hm nay. Hm nay chng ta ? th?o lu?n:  ?? tr? ch?ng ?au d? dy v ?au ??u, vui lng th? lo?i thu?c m?i c tn amitriptyline. Chng ti ngh? r?ng n c th? gip b?n gi?m ?au d? dy v ?au ??u. Hy u?ng thu?c ny vo bu?i t?i v n c th? khi?n b?n bu?n ng? v ph?i u?ng thu?c hng ngy. Chng ti hy v?ng lo?i thu?c ny c th? gip gi?m ?au nh?ng c th? khng lo?i b? hon ton c?n ?au.   Chng ti c?ng mu?n ch?p CT ??u c?a b?n ?? gip chng ti hi?u c?n ?au c?a b?n ??n t? ?u. Chng ti c?ng ?ang yu c?u cc nh th?n kinh h?c, bc s? no b? gip b?n s?m h?n.  Theo di: 1 thng  C?m ?n b?n ? tin t??ng ch?m [place] ti. Chc b?n nh?ng ?i?u t?t ??p nh?t!   _____    Thank you, Ms. Srihitha Pileggi for allowing [place] to provide your care today. Today we discussed:  For your stomach pain and headaches, please try this new medication called amitriptyline. We think that it may help with your stomach pain and with the headaches. Please take this at night because it can make you sleepy and take the pill every day. We expect this medication can help with the pain, but it might not get rid of it entirely.   We would also like to get a scan of your head called a CT to help [place] understand where your pain is coming from. We are also asking the neurologists, the brain doctors, to fit you in sooner.     I have ordered the following medication/changed the following medications:   Stop the following medications: Medications Discontinued During This Encounter  Medication Reason   methylPREDNISolone sodium succinate (SOLU-MEDROL) 40 mg/mL injection 32 mg      Start the following medications: Meds ordered this encounter  Medications   amitriptyline (ELAVIL) 25 MG tablet    Sig: Take 1 tablet (25 mg total) by mouth at bedtime.    Dispense:  30 tablet    Refill:  2     Follow up: 1 month  We look forward to seeing you next time. Please call our clinic at  [phone] if you have any questions or concerns. The best time to call is Monday-Friday from 9am-4pm, but there is someone available 24/7. If after hours or the weekend, call the main hospital number and ask for the Internal Medicine Resident On-Call. If you need medication refills, please notify your pharmacy one week in advance and they will send [place] a request.   Thank you for trusting me with your care. Wishing you the best!   Thea Alken, MS3

## 2023-12-13 NOTE — Assessment & Plan Note (Signed)
Abdominal pain still has not eased up.  CT abdomen pelvis was unremarkable for any acute concerns.  At this point we have ruled out any acute concerns such as diverticulitis, seroma, acute mesenteric ischemia, or abscess.  This is likely chronic in nature.  She describes it as a burning sensation today.  I do wonder if this is related to her previous surgeries, may have injured the nerve and causing neuropathy.  Will trial amitriptyline at this time.   Plan: -Amitriptyline 25 mg nightly -Follow-up in 1 month

## 2023-12-13 NOTE — Assessment & Plan Note (Signed)
Patient returns to clinic after 4 days of prednisone therapy.  None of this has alleviated her headaches.  She reports having this right-sided headache still.  She states it is constant.  The prednisone did not help.  Do not think this was temporal arteritis.  ESR and CRP within normal limits.  Will discontinue prednisone at this time.  Given that this does not fit in the right category of tension, cluster or migraine, I think we will have to move forward with a CT head.  Plan: -Order CT head -Start amitriptyline 25 mg nightly

## 2023-12-13 NOTE — Progress Notes (Signed)
CC: Headache and abdominal pain follow-up  HPI:  [name] [name] is a 63 y.o. female with a past medical history of hypertension and history of partial bowel obstruction who presents for follow-up appointment.  Please see assessment and plan for full HPI.   Past Medical History:  Diagnosis Date   Anemia 07/27/2016   Annual physical exam 06/27/2020   Atypical chest pain 08/07/2020   Clotting disorder (HCC)    Colostomy stricture (HCC) 07/02/2015   Constipation    Cough 08/07/2020   Depression    Diarrhea 11/13/2020   Acute watery diarrhea starting 12/14 associated with abdominal pain, headache, feeling "hot and cold".    Difficulty sleeping    Encounter for completion of form with patient 10/23/2021   Headache 07/18/2018   Hypokalemia 06/27/2020   Influenza vaccine administered 08/06/2021   Literacy level of illiterate 10/01/2016   Less than one year of formal education in [place].  [name] is taking classes for reading and writing in [place].    S/P colostomy [facility]) 03/02/2015   March 2016: colon perforation following screening colonoscopy    S/P colostomy [facility]) 03/02/2015   March 2016: colon perforation following screening colonoscopy    SBO (small bowel obstruction) (HCC) 03/18/2021     Current Outpatient Medications:    amitriptyline (ELAVIL) 25 MG tablet, Take 1 tablet (25 mg total) by mouth at bedtime., Disp: 30 tablet, Rfl: 2   acetaminophen (TYLENOL) 500 MG tablet, Take 1,000 mg by mouth every 6 (six) hours as needed for headache or moderate pain., Disp: , Rfl:    amLODipine (NORVASC) 5 MG tablet, Take 1 tablet (5 mg total) by mouth daily., Disp: 90 tablet, Rfl: 1   donepezil (ARICEPT) 5 MG tablet, Take 1 tablet (5 mg total) by mouth at bedtime., Disp: 30 tablet, Rfl: 11   pantoprazole (PROTONIX) 40 MG tablet, Take 1 tablet (40 mg total) by mouth daily., Disp: 90 tablet, Rfl: 3  Current Facility-Administered Medications:    predniSONE (DELTASONE) tablet 40 mg, 40 mg, Oral, Q  breakfast,   Review of Systems:    Constitutional: Patient endorses headaches GI: Patient endorses abdominal pain  Physical Exam:  Vitals:   12/13/23 1447  BP: 123/78  Pulse: 74  Temp: 98.3 F (36.8 C)  TempSrc: Oral  SpO2: 98%  Weight: 75 lb 12.8 oz (34.4 kg)  Height: 4\' 9"  (1.448 m)   General: Patient is sitting comfortably in the room  Cardio: Regular rate and rhythm, no murmurs, rubs or gallops Pulmonary: Clear to ausculation bilaterally with no rales, rhonchi, and crackles  Abdomen: Soft, with lower midline tenderness, no distention, no rebound, no guarding   Assessment & Plan:   Nonintractable headache Patient returns to clinic after 4 days of prednisone therapy.  None of this has alleviated her headaches.  She reports having this right-sided headache still.  She states it is constant.  The prednisone did not help.  Do not think this was temporal arteritis.  ESR and CRP within normal limits.  Will discontinue prednisone at this time.  Given that this does not fit in the right category of tension, cluster or migraine, I think we will have to move forward with a CT head.  Plan: -Order CT head -Start amitriptyline 25 mg nightly  Abdominal pain Abdominal pain still has not eased up.  CT abdomen pelvis was unremarkable for any acute concerns.  At this point we have ruled out any acute concerns such as diverticulitis, seroma, acute mesenteric ischemia,  or abscess.  This is likely chronic in nature.  She describes it as a burning sensation today.  I do wonder if this is related to her previous surgeries, may have injured the nerve and causing neuropathy.  Will trial amitriptyline at this time.   Plan: -Amitriptyline 25 mg nightly -Follow-up in 1 month  Patient discussed with Dr. Marquita Palms, DO PGY-2 Internal Medicine Resident  Pager: [phone]

## 2023-12-15 NOTE — Progress Notes (Signed)
Internal Medicine Clinic Attending  I was physically present during the key portions of the resident provided service and participated in the medical decision making of patient's management care. I reviewed pertinent patient test results.  The assessment, diagnosis, and plan were formulated together and I agree with the documentation in the resident's note.  Challenging situation with concern for symptom history lost in translation.  The headache and abdominal pain are not related.  Temporal artery pain confirmed on my exam; no jaw clicking or chewing pain, no vision loss or oculomotor nerve impairment, no shoulder or hip girdle pain or weakness if she understood our questions and exam instructions correctly.  Given the approaching weekend, treating empirically for temporal arteritis with prednisone while awaiting inflammatory markers.  If no improvement with steroid by next week and if inflammatory markers are ruled out, we will proceed with head imaging to evaluate new persistent headache.  Abdominal exam notable for scars of former surgeries; her midline scar is very tender in an area about 2-3 cm in diameter associated with palpable firmness concerning for incisional hernia (though it does not bulge with valsalva) or seroma.  Not concerned for abscess at this time.  Proceed with CT.   Miguel Aschoff, MD

## 2023-12-19 ENCOUNTER — Telehealth: Payer: Self-pay | Admitting: Student

## 2023-12-19 DIAGNOSIS — R519 Headache, unspecified: Secondary | ICD-10-CM

## 2023-12-19 NOTE — Telephone Encounter (Signed)
-----   Message from Chilon B sent at 12/19/2023  1:14 PM EST ----- Good Afternoon. If you would like to place a New Referral in Urgent Status it is a possible they may be able to move up the appointment.  The one in March is just a regular 1 year f/u per the notes on the appointment.  If she has another reason for a sooner appt please place a new referral with your comments on it and I will send it ASAP.  Thanks!  Chilon ----- Message ----- From: Modena Slater, DO Sent: 12/13/2023   6:05 PM EST To: Jacob Moores  Could you see if we could call the neuro office that she is going to see if we can push up her neuro appointment?

## 2023-12-22 NOTE — Progress Notes (Signed)
Internal Medicine Clinic Attending  Case discussed with the resident at the time of the visit.  We reviewed the resident's history and exam and pertinent patient test results.  I agree with the assessment, diagnosis, and plan of care documented in the resident's note. Agree that this HA syndrome is not typical of TA/GAC (inflammatory markers not elevated, no improvement with empiric prednisone over the weekend), or of migraine or cluster headache, as we understand through language interpretation of her symptoms description.  Most appropriate management is proceeding with brain imaging for new constant headache in adult.

## 2024-01-16 NOTE — Progress Notes (Unsigned)
 [facility] Return Visit   Referring Provider Rana Snare, DO [address] [place],  [place] [address]  Primary Care Provider Rana Snare, DO  Patient Profile: [name] [name] is a 63 y.o. female who returns to the [facility] for follow-up of the problem(s) noted below.  Problem List: Chronic abdominal pain History of sigmoid colon perforation during colonoscopy 2016 status postresection History of SBO secondary to adhesive disease Colon cancer screening-normal colonoscopy 2016-repeat 2026  [name] [name] is accompanied to the office today by her daughter and a Elissa Lovett interpreter   History of Present Illness   [name] [name] was last seen in the GI office 08/28/2021 by Dr. Orvan Falconer   Current GI Meds  Pantoprazole 40 mg orally daily MiraLAX 1 cap daily  Interval History  In speaking with [name] [name] as well as reviewing her prior medical records, her history is noteworthy for the following:  History from Dr. Orvan Falconer clinic note 08/28/2021  "[place] 1991 after a miscarriage. But, did not have GI symptoms until 2016 when she near daily abdominal pain after her colectomy.    Colon perforation happened during a screening colonoscopy in 2016. She underwent colectomy with colostomy and subsequent reversal with Dr. Maisie Fus in 2016. She had a normal colonoscopy with Dr. Maisie Fus 06/12/15.   Hospitalized in April 2022 for CT-diagnosed partial small bowel obstruction that resolved with small bowel obstruction protocol."  [name] [name] was readmitted to [facility] 10/22/2023 - 10/23/2023 for acute abdominal pain, nausea and vomiting.  CTAP showed ileus versus small bowel obstruction.  She was managed conservatively with Zofran, Toradol and NG tube decompression.  A Gastrografin study showed contrast reaching the colon.  She was discharged from the hospital uneventfully.  She followed up in primary care clinic 12/09/2023 reporting ongoing  abdominal pain.  Physical examination that day revealed diffuse tenderness prompting a follow-up CT scan.  There were no acute findings on the CT.  Stomach was partially distended without acute change.  Small bowel was nondilated.  At today's visit she describes constant abdominal discomfort across her left and mid abdomen.  This is exacerbated by eating any type of food.  She notes worsening pain as the day progresses and she eats more.  Has not been able to identify any specific food triggers.  No current nausea or vomiting.  Sometimes takes ibuprofen for the pain.  She has a concomitant history of headaches.  Headaches do not occur at the same time as abdominal pain.  No change in bowel habits.  Takes MiraLAX 1 cap daily to keep bowels moving.  No diarrhea, melena or hematochezia  [name] [name] is petite in frame and stature.  Current weight is 75 pounds which is stable for her.  Last colonoscopy: 05/2015 - normal Last endoscopy: 08/2021 - erosive gastropathy, erythematous duodenopathy   Last Abd CT/CTE/MRE: 11/2023 -no acute findings  GI Review of Symptoms Significant for chronic abdominal pain. Otherwise negative.  General Review of Systems  Review of systems is significant for the pertinent positives and negatives as listed per the HPI.  Full ROS is otherwise negative.  Past Medical History   Past Medical History:  Diagnosis Date   Anemia 07/27/2016   Annual physical exam 06/27/2020   Atypical chest pain 08/07/2020   Clotting disorder (HCC)    Colostomy stricture (HCC) 07/02/2015   Constipation    Cough 08/07/2020   Depression    Diarrhea 11/13/2020   Acute watery diarrhea starting  12/14 associated with abdominal pain, headache, feeling "hot and cold".    Difficulty sleeping    Encounter for completion of form with patient 10/23/2021   Headache 07/18/2018   Hypokalemia 06/27/2020   Influenza vaccine administered 08/06/2021   Literacy level of illiterate 10/01/2016   Less than one year  of formal education in [place].  [name] [name] is taking classes for reading and writing in [place].    S/P colostomy [facility]) 03/02/2015   March 2016: colon perforation following screening colonoscopy    S/P colostomy [facility]) 03/02/2015   March 2016: colon perforation following screening colonoscopy    SBO (small bowel obstruction) (HCC) 03/18/2021     Past Surgical History   Past Surgical History:  Procedure Laterality Date   ABDOMINAL HYSTERECTOMY  1994   APPENDECTOMY  1993   CESAREAN SECTION  1992   COLONOSCOPY N/A 05/30/2015   Procedure: COLONOSCOPY;  Surgeon: Romie Levee, MD;  Location: [facility];  Service: Endoscopy;  Laterality: N/A;   COLONOSCOPY WITH PROPOFOL N/A 06/12/2015   Procedure: COLONOSCOPY WITH PROPOFOL;  Surgeon: Romie Levee, MD;  Location: [facility];  Service: Endoscopy;  Laterality: N/A;   COLOSTOMY  01/2015   COLOSTOMY TAKEDOWN N/A 07/02/2015   Procedure: LAPAROSCOPIC COLOSTOMY REVERSAL SPLENIC FLEXURE MOBILIZATION AND PARTIAL COLECTOMY;  Surgeon: Romie Levee, MD;  Location: [facility];  Service: General;  Laterality: N/A;   DILATION AND CURETTAGE OF UTERUS  1990   "miscarriage"   LAPAROTOMY N/A 02/20/2015   Procedure: PARTIAL COLECTOMY END  ILEOSTOMY AND HARTMAN'S PROCEDURE;  Surgeon: Romie Levee, MD;  Location: [facility];  Service: General;  Laterality: N/A;     Allergies and Medications  No Known Allergies   Current Meds  Medication Sig   acetaminophen (TYLENOL) 500 MG tablet Take 1,000 mg by mouth as needed for headache or moderate pain (pain score 4-6).   amitriptyline (ELAVIL) 10 MG tablet Take 0.5 tablets (5 mg total) by mouth at bedtime.   amLODipine (NORVASC) 5 MG tablet Take 1 tablet (5 mg total) by mouth daily.   donepezil (ARICEPT) 5 MG tablet Take 1 tablet (5 mg total) by mouth at bedtime.   ibuprofen (ADVIL) 200 MG tablet Take 200 mg by mouth as needed.   pantoprazole (PROTONIX) 40 MG tablet Take 1 tablet (40 mg total) by mouth daily. (Patient taking  differently: Take 40 mg by mouth as needed.)   polyethylene glycol powder (GLYCOLAX/MIRALAX) 17 GM/SCOOP powder Taking 2 capfuls in liquid by mouth as needed   [DISCONTINUED] amitriptyline (ELAVIL) 25 MG tablet Take 1 tablet (25 mg total) by mouth at bedtime.    Family History   Family History  Problem Relation Age of Onset   Colon cancer Neg Hx    Rectal cancer Neg Hx    Stomach cancer Neg Hx     Social History   Social History   Tobacco Use   Smoking status: Every Day    Current packs/day: 0.50    Average packs/day: 0.5 packs/day for 37.0 years (18.5 ttl pk-yrs)    Types: Cigarettes   Smokeless tobacco: Never  Vaping Use   Vaping status: Never Used  Substance Use Topics   Alcohol use: No    Alcohol/week: 0.0 standard drinks of alcohol   Drug use: No   Ardis reports that she has been smoking cigarettes. She has a 18.5 pack-year smoking history. She has never used smokeless tobacco. She reports that she does not drink alcohol and does not use drugs.  Vital Signs  and Physical Examination   Vitals:   01/18/24 1039  BP: 116/72  Pulse: 80   Body mass index is 17.81 kg/m. Weight: 75 lb 4 oz (34.1 kg)  General: Petite, thin Head: Normocephalic and atraumatic Mouth: No deformities or lesions noted Lungs: Clear throughout to auscultation Heart: Regular rate and rhythm; No murmurs, rubs or bruits Abdomen: Soft, to palpation in the mid abdomen, left lower quadrant and left upper quadrant and non distended -pain over upper abdominal scar. No masses, hepatosplenomegaly or hernias noted. Normal Bowel sounds,  Rectal: Deferred Musculoskeletal: Symmetrical with no gross deformities    Review of Data  The following data was reviewed at the time of this encounter:  Laboratory Studies      Latest Ref Rng & Units 10/23/2023    8:24 AM 10/22/2023   11:54 AM 03/25/2022   11:37 AM  CBC  WBC 4.0 - 10.5 K/uL 6.1  12.6  5.1   Hemoglobin 12.0 - 15.0 g/dL 16.1  09.6  9.7    Hematocrit 36.0 - 46.0 % 32.5  34.5  31.2   Platelets 150 - 400 K/uL 206  195  245     Lab Results  Component Value Date   LIPASE 14 10/22/2023      Latest Ref Rng & Units 10/23/2023    8:24 AM 10/22/2023   11:54 AM 10/01/2022   11:14 AM  CMP  Glucose 70 - 99 mg/dL 87  045  87   BUN 8 - 23 mg/dL 6  10  6    Creatinine 0.44 - 1.00 mg/dL 4.09  8.11  9.14   Sodium 135 - 145 mmol/L 140  135  140   Potassium 3.5 - 5.1 mmol/L 3.5  3.3  3.4   Chloride 98 - 111 mmol/L 108  97  97   CO2 22 - 32 mmol/L 23  29  29    Calcium 8.9 - 10.3 mg/dL 8.8  9.1  9.4   Total Protein 6.5 - 8.1 g/dL  7.0    Total Bilirubin <1.2 mg/dL  0.5    Alkaline Phos 38 - 126 U/L  96    AST 15 - 41 U/L  13    ALT 0 - 44 U/L  6       Imaging Studies  - CT 12/09/23: No acute findings - CT 10/22/23: A few dilated small bowel loops-ileus versus SBO - CT 03/18/21: small bowel obstruction in the lower abdomen, evidence for prior surgery - CT 08/14/21: No acute process; large calcification in the spleen and RLQ mesentery - CT 08/18/21: Moderate stool throughout the colon without obstruction, changes in the spleen and right lower quadrant - likely sequela of remote trauma or inflammation    GI Procedures and Studies  Colonoscopy 05/2015 Normal  Colonoscopy 01/2015 Colonic perforation at the sigmoid colon  Clinical Impression  It is my clinical impression that [name] [name] is a 63 y.o. female with;  Chronic abdominal pain History of sigmoid colon perforation during colonoscopy 2016 status post resection History of SBO secondary to adhesive disease Colon cancer screening-normal colonoscopy 2016-repeat 2026   [name] [name] presents for evaluation and management of chronic abdominal pain.  Her prior medical history is noteworthy for sigmoid colon perforation during colonoscopy in 2016 status post sigmoid resection.  Her bowel has since been anastomosed in continuity.  Over the ensuing years she has had intermittent small  bowel obstruction secondary to adhesive disease.  Most recently hospitalized 11/202.  She complains of chronic  abdominal pain that has been present since her surgery.  Last CT 11/2023 did not show any acute abnormalities.  It is presumed that her discomfort could be related to scar tissue and adhesive disease.  She is focally tender over a scar on her upper abdomen on exam today.  In the absence of other abnormalities I discussed with her family trialing nonnarcotic modalities to manage chronic abdominal pain.  We discussed options of amitriptyline and gabapentin.  Her daughter notes that [name] [name] is exquisitely sensitive to medication and I therefore recommended trialing a low-dose of amitriptyline.  Reviewed potential side effects of dry eyes, dry mouth, sleepiness.  Previous EKGs were reviewed and no evidence of prolonged QT.  Plan  Trial amitriptyline 2.5 mg p.o. nightly -advised her daughter to send a MyChart message in 2 weeks.  If symptoms are not improved but she is not experiencing side effects we will dose escalate to 5 mg p.o. nightly Other medical options for management of her chronic abdominal pain could include gabapentin, Lyrica and Topamax Continue MiraLAX 1 cap daily to maintain adequate bowel regimen Monitor for signs and symptoms of SBO-her daughter is aware that if she develops severe pain and cessation of bowel movements she should come to the emergency room Colonoscopy 2019-normal-next screening colonoscopy due 2029  Planned Follow Up May 2025  The patient or caregiver verbalized understanding of the material covered, with no barriers to understanding. All questions were answered. Patient or caregiver is agreeable with the plan outlined above.    It was a pleasure to see [name] [name].  If you have any questions or concerns regarding this evaluation, do not hesitate to contact me.  Maren Beach, MD [facility]   I spent total of 30 minutes in both face-to-face and  non-face-to-face activities, excluding procedures performed, for the visit on the date of this encounter.

## 2024-01-18 ENCOUNTER — Encounter: Payer: Self-pay | Admitting: Pediatrics

## 2024-01-18 ENCOUNTER — Ambulatory Visit: Payer: Medicaid Other | Admitting: Pediatrics

## 2024-01-18 VITALS — BP 116/72 | HR 80 | Ht <= 58 in | Wt 75.2 lb

## 2024-01-18 DIAGNOSIS — G8929 Other chronic pain: Secondary | ICD-10-CM

## 2024-01-18 DIAGNOSIS — R109 Unspecified abdominal pain: Secondary | ICD-10-CM | POA: Diagnosis not present

## 2024-01-18 DIAGNOSIS — Z8719 Personal history of other diseases of the digestive system: Secondary | ICD-10-CM | POA: Diagnosis not present

## 2024-01-18 MED ORDER — AMITRIPTYLINE HCL 10 MG PO TABS: 5.0000 mg | ORAL_TABLET | Freq: Every day | ORAL | 3 refills | Status: AC

## 2024-01-18 NOTE — Patient Instructions (Signed)
 We have sent the following medications to your pharmacy for you to pick up at your convenience:  Amitriptyline 5 (half a tablet)mg by mouth daily at bedtime   Follow up in 3 months.  _______________________________________________________  If your blood pressure at your visit was 140/90 or greater, please contact your primary care physician to follow up on this.  _______________________________________________________  If you are age 62 or older, your body mass index should be between 23-30. Your Body mass index is 17.81 kg/m. If this is out of the aforementioned range listed, please consider follow up with your Primary Care Provider.  If you are age 57 or younger, your body mass index should be between 19-25. Your Body mass index is 17.81 kg/m. If this is out of the aformentioned range listed, please consider follow up with your Primary Care Provider.   ________________________________________________________  The [facility] providers would like to encourage you to use [facility] to communicate with providers for non-urgent requests or questions.  Due to long hold times on the telephone, sending your provider a message by [facility] may be a faster and more efficient way to get a response.  Please allow 48 business hours for a response.  Please remember that this is for non-urgent requests.  _______________________________________________________   It was a pleasure to see you today!  Thank you for trusting me with your gastrointestinal care!

## 2024-02-16 ENCOUNTER — Ambulatory Visit: Payer: Medicaid Other | Admitting: Neurology

## 2024-04-18 NOTE — Progress Notes (Signed)
  Gastroenterology Return Visit   Referring Provider Jearldine Mina, DO [address] [place],  [place] [address]  Primary Care Provider Jearldine Mina, DO  Patient Profile: [name] [name] is a 63 y.o. female who returns to the [facility] for follow-up of the problem(s) noted below.  Problem List: Chronic abdominal pain History of sigmoid colon perforation during colonoscopy 2016 status postresection History of SBO secondary to adhesive disease Colon cancer screening-normal colonoscopy 2016-repeat 2026  [name] [name] is accompanied to the office today by her daughter and a [employer] interpreter   History of Present Illness   [name] [name] was last seen in the GI office 01/20/2024   Current GI Meds  Amitriptyline  2.5 mg p.o. daily  -- daughter states she is not taking this Pantoprazole  40 mg orally daily --daughter states she is not taking this MiraLAX  1 cap daily  Interval History  In speaking with [name] [name] as well as reviewing her prior medical records, her history is noteworthy for the following:  History from Dr. Savannah Curlin clinic note 08/28/2021  "[place] 1991 after a miscarriage. But, did not have GI symptoms until 2016 when she near daily abdominal pain after her colectomy.   Colon perforation happened during a screening colonoscopy in 2016. She underwent colectomy with colostomy and subsequent reversal with Dr. Andy Bannister in 2016. She had a normal colonoscopy with Dr. Andy Bannister 06/12/15.  Hospitalized in April 2022 for CT-diagnosed partial small bowel obstruction that resolved with small bowel obstruction protocol."  [name] [name] was readmitted to [facility] 10/22/2023 - 10/23/2023 for acute abdominal pain, nausea and vomiting.  CTAP showed ileus versus small bowel obstruction.  She was managed conservatively with Zofran , Toradol  and NG tube decompression.  A Gastrografin  study showed contrast reaching the colon.  She was  discharged from the hospital uneventfully.  She followed up in primary care clinic 12/09/2023 reporting ongoing abdominal pain.  Physical examination that day revealed diffuse tenderness prompting a follow-up CT scan.  There were no acute findings on the CT.  Stomach was partially distended without acute change.  Small bowel was nondilated.  At the time of her visit in 12/2023 she described constant abdominal discomfort across her left and mid abdomen.  She reported that her pain was exacerbated by eating any type of food and seemingly progressed throughout the course of the day.  In light of her otherwise normal studies, I discussed with her daughter that her pain may be related to adhesive disease and visceral hypersensitivity.  As such, I recommended a trial of amitriptyline  2.5 mg p.o. nightly.  [name] [name] daughter states that her mother has tried both pantoprazole  and low-dose amitriptyline  but felt they were "too strong" so she stopped them.  When queried what she meant by this she seemed to indicate that the medicines made her feel fatigued and weak.  Her pain is essentially unchanged today.  Denies obstructive symptoms, nausea or vomiting.  Does have a history of concomitant headaches which made amitriptyline  an attractive consideration.  No melena or hematochezia.  [name] [name] is petite in frame and stature.  Current weight is 73 pounds which is stable for her.  Last colonoscopy: 05/2015 - normal Last endoscopy: 08/2021 - erosive gastropathy, erythematous duodenopathy   Last Abd CT/CTE/MRE: 11/2023 -no acute findings  GI Review of Symptoms Significant for chronic abdominal pain. Otherwise negative.  General Review of Systems  Review of systems is significant for the pertinent positives  and negatives as listed per the HPI.  Full ROS is otherwise negative.  Past Medical History   Past Medical History:  Diagnosis Date   Anemia 07/27/2016   Annual physical exam 06/27/2020   Atypical chest pain  08/07/2020   Clotting disorder (HCC)    Colostomy stricture (HCC) 07/02/2015   Constipation    Cough 08/07/2020   Depression    Diarrhea 11/13/2020   Acute watery diarrhea starting 12/14 associated with abdominal pain, headache, feeling "hot and cold".    Difficulty sleeping    Encounter for completion of form with patient 10/23/2021   Headache 07/18/2018   Hypokalemia 06/27/2020   Influenza vaccine administered 08/06/2021   Literacy level of illiterate 10/01/2016   Less than one year of formal education in [place].  [name] [name] is taking classes for reading and writing in [place].    S/P colostomy [facility]) 03/02/2015   March 2016: colon perforation following screening colonoscopy    S/P colostomy [facility]) 03/02/2015   March 2016: colon perforation following screening colonoscopy    SBO (small bowel obstruction) (HCC) 03/18/2021     Past Surgical History   Past Surgical History:  Procedure Laterality Date   ABDOMINAL HYSTERECTOMY  1994   APPENDECTOMY  1993   CESAREAN SECTION  1992   COLONOSCOPY N/A 05/30/2015   Procedure: COLONOSCOPY;  Surgeon: Joyce Nixon, MD;  Location: [facility];  Service: Endoscopy;  Laterality: N/A;   COLONOSCOPY WITH PROPOFOL  N/A 06/12/2015   Procedure: COLONOSCOPY WITH PROPOFOL ;  Surgeon: Joyce Nixon, MD;  Location: [facility];  Service: Endoscopy;  Laterality: N/A;   COLOSTOMY  01/2015   COLOSTOMY TAKEDOWN N/A 07/02/2015   Procedure: LAPAROSCOPIC COLOSTOMY REVERSAL SPLENIC FLEXURE MOBILIZATION AND PARTIAL COLECTOMY;  Surgeon: Joyce Nixon, MD;  Location: [facility];  Service: General;  Laterality: N/A;   DILATION AND CURETTAGE OF UTERUS  1990   "miscarriage"   LAPAROTOMY N/A 02/20/2015   Procedure: PARTIAL COLECTOMY END  ILEOSTOMY AND HARTMAN'S PROCEDURE;  Surgeon: Joyce Nixon, MD;  Location: [facility];  Service: General;  Laterality: N/A;     Allergies and Medications  No Known Allergies   Current Meds  Medication Sig   acetaminophen  (TYLENOL ) 500 MG  tablet Take 1,000 mg by mouth as needed for headache or moderate pain (pain score 4-6).   amitriptyline  (ELAVIL ) 10 MG tablet Take 0.5 tablets (5 mg total) by mouth at bedtime.   donepezil  (ARICEPT ) 5 MG tablet Take 1 tablet (5 mg total) by mouth at bedtime.   ibuprofen  (ADVIL ) 200 MG tablet Take 200 mg by mouth as needed.   polyethylene glycol powder (GLYCOLAX /MIRALAX ) 17 GM/SCOOP powder Taking 2 capfuls in liquid by mouth as needed    Family History   Family History  Problem Relation Age of Onset   Colon cancer Neg Hx    Rectal cancer Neg Hx    Stomach cancer Neg Hx     Social History   Social History   Tobacco Use   Smoking status: Every Day    Current packs/day: 0.50    Average packs/day: 0.5 packs/day for 37.0 years (18.5 ttl pk-yrs)    Types: Cigarettes   Smokeless tobacco: Never   Tobacco comments:    1/2 pack per day  Vaping Use   Vaping status: Never Used  Substance Use Topics   Alcohol use: No    Alcohol/week: 0.0 standard drinks of alcohol   Drug use: No   Lari reports that she has been smoking cigarettes. She has a  18.5 pack-year smoking history. She has never used smokeless tobacco. She reports that she does not drink alcohol and does not use drugs.  Vital Signs and Physical Examination   Vitals:   04/20/24 1012  BP: 128/76  Pulse: 71    Body mass index is 17.08 kg/m. Weight: 73 lb 8 oz (33.3 kg)  General: Petite, thin Head: Normocephalic and atraumatic Mouth: No deformities or lesions noted Lungs: Clear throughout to auscultation Heart: Regular rate and rhythm; No murmurs, rubs or bruits Abdomen: Soft, to palpation in the mid abdomen, left lower quadrant and left upper quadrant and non distended -pain over upper abdominal scar. No masses, hepatosplenomegaly or hernias noted. Normal Bowel sounds,  Rectal: Deferred Musculoskeletal: Symmetrical with no gross deformities    Review of Data  The following data was reviewed at the time of this  encounter:  Laboratory Studies      Latest Ref Rng & Units 10/23/2023    8:24 AM 10/22/2023   11:54 AM 03/25/2022   11:37 AM  CBC  WBC 4.0 - 10.5 K/uL 6.1  12.6  5.1   Hemoglobin 12.0 - 15.0 g/dL 16.1  09.6  9.7   Hematocrit 36.0 - 46.0 % 32.5  34.5  31.2   Platelets 150 - 400 K/uL 206  195  245     Lab Results  Component Value Date   LIPASE 14 10/22/2023      Latest Ref Rng & Units 10/23/2023    8:24 AM 10/22/2023   11:54 AM 10/01/2022   11:14 AM  CMP  Glucose 70 - 99 mg/dL 87  045  87   BUN 8 - 23 mg/dL 6  10  6    Creatinine 0.44 - 1.00 mg/dL 4.09  8.11  9.14   Sodium 135 - 145 mmol/L 140  135  140   Potassium 3.5 - 5.1 mmol/L 3.5  3.3  3.4   Chloride 98 - 111 mmol/L 108  97  97   CO2 22 - 32 mmol/L 23  29  29    Calcium 8.9 - 10.3 mg/dL 8.8  9.1  9.4   Total Protein 6.5 - 8.1 g/dL  7.0    Total Bilirubin <1.2 mg/dL  0.5    Alkaline Phos 38 - 126 U/L  96    AST 15 - 41 U/L  13    ALT 0 - 44 U/L  6       Imaging Studies  - CT 12/09/23: No acute findings - CT 10/22/23: A few dilated small bowel loops-ileus versus SBO - CT 03/18/21: small bowel obstruction in the lower abdomen, evidence for prior surgery - CT 08/14/21: No acute process; large calcification in the spleen and RLQ mesentery - CT 08/18/21: Moderate stool throughout the colon without obstruction, changes in the spleen and right lower quadrant - likely sequela of remote trauma or inflammation    GI Procedures and Studies  Colonoscopy 05/2015 Normal  Colonoscopy 01/2015 Colonic perforation at the sigmoid colon  Clinical Impression  It is my clinical impression that [name] [name] is a 63 y.o. female with;  Chronic abdominal pain History of sigmoid colon perforation during colonoscopy 2016 status post resection History of SBO secondary to adhesive disease Colon cancer screening-normal colonoscopy 2016-repeat 2026   [name] [name] returns to the office for follow-up of chronic abdominal pain.  Her prior medical  history is noteworthy for sigmoid colon perforation during colonoscopy in 2016 status post sigmoid resection.  Her bowel has since been anastomosed  in continuity.  Over the ensuing years she has had intermittent small bowel obstructions secondary to adhesive disease.  Most recently hospitalized 09/2023.  She complains of chronic abdominal pain that has been present since her surgery.  Last CT 11/2023 did not show any acute abnormalities.  It is presumed that her discomfort could be related to scar tissue and adhesive disease.  In the absence of other abnormalities I discussed with her family trialing nonnarcotic modalities to manage chronic abdominal pain.  At the time of her last visit, I suggested a trial of amitriptyline  in light of her abdominal pain and headaches.  Her daughter notes that she is exquisitely sensitive to medications and I therefore started with a nominal dose of amitriptyline  2.5 mg p.o. nightly.  At today's visit, her daughter informs me that they stopped both amitriptyline  and pantoprazole  because the medicines were "too strong".  She thinks the medications made her feel weak and dizzy.  We discussed performing a repeat EGD to ensure there is no evidence of H. pylori infection or peptic ulcer disease.  If this is unremarkable we can reevaluate other nonnarcotic pain medication modalities such as FDgard, cyproheptadine, gabapentin , Lyrica or Topamax.  Plan  Schedule EGD at [facility] for H. pylori infection and celiac disease Advised the patient may hold both pantoprazole  and amitriptyline  if she is not finding them efficacious and feels she is incurring side effects Other medical options for management of her chronic abdominal pain could include FDgard, cyproheptadine gabapentin , Lyrica and Topamax Continue MiraLAX  1 cap daily to maintain adequate bowel regimen Monitor for signs and symptoms of SBO-her daughter is aware that if she develops severe pain and cessation of bowel movements  she should come to the emergency room Colonoscopy 2016 - normal - next screening colonoscopy due 2026  Planned Follow Up 3 months  The patient or caregiver verbalized understanding of the material covered, with no barriers to understanding. All questions were answered. Patient or caregiver is agreeable with the plan outlined above.    It was a pleasure to see [name] [name].  If you have any questions or concerns regarding this evaluation, do not hesitate to contact me.  Eugenia Hess, MD [facility]

## 2024-04-20 ENCOUNTER — Ambulatory Visit: Payer: Medicaid Other | Admitting: Pediatrics

## 2024-04-20 ENCOUNTER — Encounter: Payer: Self-pay | Admitting: Neurology

## 2024-04-20 ENCOUNTER — Ambulatory Visit: Admitting: Neurology

## 2024-04-20 ENCOUNTER — Encounter: Payer: Self-pay | Admitting: Pediatrics

## 2024-04-20 VITALS — BP 128/76 | HR 71 | Ht <= 58 in | Wt 73.5 lb

## 2024-04-20 VITALS — BP 120/75 | HR 74 | Ht <= 58 in | Wt 75.0 lb

## 2024-04-20 DIAGNOSIS — Z8719 Personal history of other diseases of the digestive system: Secondary | ICD-10-CM

## 2024-04-20 DIAGNOSIS — G44209 Tension-type headache, unspecified, not intractable: Secondary | ICD-10-CM | POA: Diagnosis not present

## 2024-04-20 DIAGNOSIS — G8929 Other chronic pain: Secondary | ICD-10-CM

## 2024-04-20 DIAGNOSIS — R109 Unspecified abdominal pain: Secondary | ICD-10-CM | POA: Diagnosis not present

## 2024-04-20 DIAGNOSIS — F03A0 Unspecified dementia, mild, without behavioral disturbance, psychotic disturbance, mood disturbance, and anxiety: Secondary | ICD-10-CM | POA: Diagnosis not present

## 2024-04-20 MED ORDER — DONEPEZIL HCL 5 MG PO TABS: 5.0000 mg | ORAL_TABLET | Freq: Every day | ORAL | 4 refills | Status: AC

## 2024-04-20 NOTE — Progress Notes (Signed)
 [facility]  PATIENT: [name] [name] DOB: May 31, 1961  REQUESTING CLINICIAN: Jearldine Mina, DO HISTORY FROM: Patient with daughter  REASON FOR VISIT: Headaches   HISTORICAL  CHIEF COMPLAINT:  Chief Complaint  Patient presents with   Follow-up    RM 12 with daughter/Ashley who speaks english and will interpret (waiver form signed for interpreter) Last seen 02/16/23.  Referral for headache.  Headaches improving. Having about 2-3 headaches per month. Taking ibuprofen  OTC which helps.  Pain located on right side of head.     INTERVAL HISTORY 04/20/2024: Patient presents today for follow-up, she is accompanied by her daughter.  Last visit was in March 2024.  At that time we were managing her for her mild dementia.  She is coming today with complaints of headache, that she described as right sided headache involving the right side of her neck.  She is having these headaches 2-3 times per month lasting the entire day.  She did follow with her PCP who started her on amitriptyline  and ordered a head CT.  They have not completed the head CT.  Patient and family are telling me that over-the-counter Tylenol  or ibuprofen  help with the headaches.  Denies any nausea or vomiting associated with the headaches, no sensitivity to light or sensitivity noise associated with the headaches.  No focal neurological deficit associated with the headache.   INTERVAL HISTORY 02/16/2023: Patient presents today for follow-up, she is accompanied by her son in law and interpreter.  Last visit was in June and at that time we diagnosed her with mild dementia and started her on Aricept .  Unfortunately she lost her insurance and could not get the Aricept .  Grandson reports that she is stable, she is still forgetful, needs reminders but otherwise she is stable, denies any agitation, she is not wandering out.  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   HISTORY OF PRESENT ILLNESS:  This is a 63 year old woman past medical history of small bowel obstruction who is presenting with memory decline.  History obtained via interpreter-.  Patient reports memory problem that has been going on for a few years but worse in the past year.  She reports being forgetful about things that she wants to do, and this is getting worse.  She forgot her appointment, needs constant reminder.  She reported her kids always have to remind her a  lot of things or else she will forget.  She forgot her appointment today until her daughter reminded her.  She is not sure about her family history of dementia because she does not know her family.  Patient   TBI:   No past history of TBI Stroke:   no past history of stroke Seizures:   no past history of seizures Sleep:   no history of sleep apnea.   Mood: Reports low mood/depression in the past   Functional status: independent in some ADLs Patient lives with husband and kids  Cooking: yes  Cleaning: yes  Shopping: Cannot go by herself  Bathing: patient  Toileting: patient Driving: Yes  Bills: Kid   Ever left the stove on by accident?: yes  Forget how to use items around the house?: Trouble with using the remote, navigating TV Getting lost going to familiar places?: Yes  Forgetting loved ones names?: Yes Word finding difficulty? Yes  Sleep: Difficult with since surgery for small bowel    OTHER MEDICAL CONDITIONS: Small  Bowel Obstruction    REVIEW OF SYSTEMS: Full 14 system review of systems performed and negative with exception of: as noted in the HPI   ALLERGIES: No Known Allergies  HOME MEDICATIONS: Outpatient Medications Prior to Visit  Medication Sig Dispense Refill   acetaminophen  (TYLENOL ) 500 MG tablet Take 1,000 mg by mouth as needed for headache or moderate pain (pain score 4-6).     amitriptyline  (ELAVIL ) 10 MG tablet Take 0.5 tablets (5 mg total) by mouth at bedtime. 30 tablet 3    ibuprofen  (ADVIL ) 200 MG tablet Take 200 mg by mouth as needed.     pantoprazole  (PROTONIX ) 40 MG tablet Take 1 tablet (40 mg total) by mouth daily. (Patient not taking: Reported on 04/20/2024) 90 tablet 3   polyethylene glycol powder (GLYCOLAX /MIRALAX ) 17 GM/SCOOP powder Taking 2 capfuls in liquid by mouth as needed     donepezil  (ARICEPT ) 5 MG tablet Take 1 tablet (5 mg total) by mouth at bedtime. 30 tablet 11   amLODipine  (NORVASC ) 5 MG tablet Take 1 tablet (5 mg total) by mouth daily. 90 tablet 1   No facility-administered medications prior to visit.    PAST MEDICAL HISTORY: Past Medical History:  Diagnosis Date   Anemia 07/27/2016   Annual physical exam 06/27/2020   Atypical chest pain 08/07/2020   Clotting disorder (HCC)    Colostomy stricture (HCC) 07/02/2015   Constipation    Cough 08/07/2020   Depression    Diarrhea 11/13/2020   Acute watery diarrhea starting 12/14 associated with abdominal pain, headache, feeling "hot and cold".    Difficulty sleeping    Encounter for completion of form with patient 10/23/2021   Headache 07/18/2018   Hypokalemia 06/27/2020   Influenza vaccine administered 08/06/2021   Literacy level of illiterate 10/01/2016   Less than one year of formal education in [place].  Ms Mcquaig is taking classes for reading and writing in [place].    S/P colostomy [facility]) 03/02/2015   March 2016: colon perforation following screening colonoscopy    S/P colostomy [facility]) 03/02/2015   March 2016: colon perforation following screening colonoscopy    SBO (small bowel obstruction) (HCC) 03/18/2021    PAST SURGICAL HISTORY: Past Surgical History:  Procedure Laterality Date   ABDOMINAL HYSTERECTOMY  1994   APPENDECTOMY  1993   CESAREAN SECTION  1992   COLONOSCOPY N/A 05/30/2015   Procedure: COLONOSCOPY;  Surgeon: Joyce Nixon, MD;  Location: [facility];  Service: Endoscopy;  Laterality: N/A;   COLONOSCOPY WITH PROPOFOL  N/A 06/12/2015   Procedure: COLONOSCOPY WITH  PROPOFOL ;  Surgeon: Joyce Nixon, MD;  Location: [facility];  Service: Endoscopy;  Laterality: N/A;   COLOSTOMY  01/2015   COLOSTOMY TAKEDOWN N/A 07/02/2015   Procedure: LAPAROSCOPIC COLOSTOMY REVERSAL SPLENIC FLEXURE MOBILIZATION AND PARTIAL COLECTOMY;  Surgeon: Joyce Nixon, MD;  Location: [facility];  Service: General;  Laterality: N/A;   DILATION AND CURETTAGE OF UTERUS  1990   "miscarriage"   LAPAROTOMY N/A 02/20/2015   Procedure: PARTIAL COLECTOMY END  ILEOSTOMY AND HARTMAN'S PROCEDURE;  Surgeon: Joyce Nixon, MD;  Location: [facility];  Service: General;  Laterality: N/A;    FAMILY HISTORY: Family History  Problem Relation Age of Onset   Colon cancer Neg Hx    Rectal cancer Neg Hx    Stomach cancer Neg Hx     SOCIAL HISTORY: Social History   Socioeconomic History   Marital status: Married    Spouse name: Not on file   Number of children: 2  Years of education: 0   Highest education level: Not on file  Occupational History   Occupation: ENVIRONMENTAL SERVICES    Employer: [employer]  Tobacco Use   Smoking status: Every Day    Current packs/day: 0.50    Average packs/day: 0.5 packs/day for 37.0 years (18.5 ttl pk-yrs)    Types: Cigarettes   Smokeless tobacco: Never   Tobacco comments:    1/2 pack per day  Vaping Use   Vaping status: Never Used  Substance and Sexual Activity   Alcohol use: No    Alcohol/week: 0.0 standard drinks of alcohol   Drug use: No   Sexual activity: Not Currently  Other Topics Concern   Not on file  Social History Narrative   Lives with daughter/daughters husband and their 4 kids   Illiterate in any language.   Less than one year of formal education in [place].    Previously worked at [employer]   From: [place] but speaks English okay       Right handed    [place] daily (1 bottle per day)   Social Drivers of Corporate investment banker Strain: Low Risk  (03/29/2023)   Overall Financial Resource Strain (CARDIA)    Difficulty  of Paying Living Expenses: Not hard at all  Food Insecurity: No Food Insecurity (10/22/2023)   Hunger Vital Sign    Worried About Running Out of Food in the Last Year: Never true    Ran Out of Food in the Last Year: Never true  Transportation Needs: No Transportation Needs (10/22/2023)   PRAPARE - Administrator, Civil Service (Medical): No    Lack of Transportation (Non-Medical): No  Physical Activity: Not on file  Stress: No Stress Concern Present (03/29/2023)   [employer] of Occupational Health - Occupational Stress Questionnaire    Feeling of Stress : Not at all  Social Connections: Moderately Integrated (03/29/2023)   Social Connection and Isolation Panel [NHANES]    Frequency of Communication with Friends and Family: More than three times a week    Frequency of Social Gatherings with Friends and Family: More than three times a week    Attends Religious Services: More than 4 times per year    Active Member of [employer] or Organizations: No    Attends Banker Meetings: Never    Marital Status: Married  Catering manager Violence: Not At Risk (10/22/2023)   Humiliation, Afraid, Rape, and Kick questionnaire    Fear of Current or Ex-Partner: No    Emotionally Abused: No    Physically Abused: No    Sexually Abused: No    PHYSICAL EXAM  GENERAL EXAM/CONSTITUTIONAL: Vitals:  Vitals:   04/20/24 0912  BP: 120/75  Pulse: 74  Weight: 75 lb (34 kg)  Height: 4\' 7"  (1.397 m)   Body mass index is 17.43 kg/m. Wt Readings from Last 3 Encounters:  04/20/24 73 lb 8 oz (33.3 kg)  04/20/24 75 lb (34 kg)  01/18/24 75 lb 4 oz (34.1 kg)   Patient is in no distress; well developed, nourished and groomed; neck is supple, thin woman   MUSCULOSKELETAL: Gait, strength, tone, movements noted in Neurologic exam below  NEUROLOGIC: MENTAL STATUS:     05/04/2022   10:02 AM  MMSE - Mini Mental State Exam  Orientation to time 1  Orientation to Place 3  Registration 3   Attention/ Calculation 0  Recall 0  Language- name 2 objects 2  Language- repeat  0  Language- follow 3 step command 3  Language- read & follow direction 0  Language-read & follow direction-comments pt is illiterate in any language  Write a sentence 0  Write a sentence-comments pt is illiterate in any language  Copy design 0  Total score 12    CRANIAL NERVE:  2nd, 3rd, 4th, 6th -visual fields full to confrontation, extraocular muscles intact, no nystagmus 5th - facial sensation symmetric 7th - facial strength symmetric 8th - hearing intact 9th - palate elevates symmetrically, uvula midline 11th - shoulder shrug symmetric 12th - tongue protrusion midline  MOTOR:  normal bulk and tone, full strength in the BUE, BLE  SENSORY:  normal and symmetric to light touch, vibration  COORDINATION:  finger-nose-finger, fine finger movements normal  GAIT/STATION:  normal   DIAGNOSTIC DATA (LABS, IMAGING, TESTING) - I reviewed patient records, labs, notes, testing and imaging myself where available.  Lab Results  Component Value Date   WBC 6.1 10/23/2023   HGB 10.0 (L) 10/23/2023   HCT 32.5 (L) 10/23/2023   MCV 70.3 (L) 10/23/2023   PLT 206 10/23/2023      Component Value Date/Time   NA 140 10/23/2023 0824   NA 140 10/01/2022 1114   K 3.5 10/23/2023 0824   CL 108 10/23/2023 0824   CO2 23 10/23/2023 0824   GLUCOSE 87 10/23/2023 0824   BUN 6 (L) 10/23/2023 0824   BUN 6 (L) 10/01/2022 1114   CREATININE 0.77 10/23/2023 0824   CREATININE 0.57 12/10/2013 1103   CALCIUM 8.8 (L) 10/23/2023 0824   PROT 7.0 10/22/2023 1154   PROT 6.8 11/13/2020 1656   ALBUMIN 4.2 10/22/2023 1154   ALBUMIN 4.4 11/13/2020 1656   AST 13 (L) 10/22/2023 1154   ALT 6 10/22/2023 1154   ALKPHOS 96 10/22/2023 1154   BILITOT 0.5 10/22/2023 1154   BILITOT 0.3 11/13/2020 1656   GFRNONAA >60 10/23/2023 0824   GFRAA 114 11/13/2020 1656   Lab Results  Component Value Date   CHOL 170 01/29/2022   HDL  53 01/29/2022   LDLCALC 97 01/29/2022   TRIG 110 01/29/2022   CHOLHDL 3.2 01/29/2022   Lab Results  Component Value Date   HGBA1C 5.2 01/29/2022   HGBA1C 5.2 01/29/2022   HGBA1C 5.2 (A) 01/29/2022   HGBA1C 5.2 01/29/2022   Lab Results  Component Value Date   VITAMINB12 492 05/04/2022   Lab Results  Component Value Date   TSH 0.911 05/04/2022    ASSESSMENT AND PLAN  63 y.o. year old female with history of small bowel obstruction and mild dementia who is presenting with complaints of headaches, right sided headaches, frequency 2-3 times per month relieved with over-the-counter Tylenol  or ibuprofen .  Based on description these are likely tension type headache.  I did advise them to continue with over-the-counter Tylenol  and ibuprofen , increase fluid intake and to follow-up with head CT ordered by PCP.  Also advised them to continue following up with PCP and to return if the frequency of the headaches increase, or if she is having any new symptoms associated with the headache.  They voiced understanding.    1. Tension headache   2. Mild dementia without behavioral disturbance, psychotic disturbance, mood disturbance, or anxiety, unspecified dementia type [facility])     Patient Instructions  Continue current medications including amitriptyline  nightly Continue with Tylenol  or ibuprofen  as needed for headaches Increase fluid intake Please follow-up with the head CT ordered by your PCP Continue to follow with PCP Return  as needed.  No orders of the defined types were placed in this encounter.   Meds ordered this encounter  Medications   donepezil  (ARICEPT ) 5 MG tablet    Sig: Take 1 tablet (5 mg total) by mouth at bedtime.    Dispense:  90 tablet    Refill:  4    Return if symptoms worsen or fail to improve.  I personally spent a total of 35 minutes in the care of the patient today including preparing to see the patient, getting/reviewing separately obtained history,  performing a medically appropriate exam/evaluation, counseling and educating, placing orders, and documenting clinical information in the EHR.   Cassandra Cleveland, MD 04/20/2024, 12:17 PM  [facility] [address], [address] [place], [place] [address] [phone]

## 2024-04-20 NOTE — Patient Instructions (Signed)
 You have been scheduled for an endoscopy. Please follow written instructions given to you at your visit today.  If you use inhalers (even only as needed), please bring them with you on the day of your procedure.  If you take any of the following medications, they will need to be adjusted prior to your procedure:   DO NOT TAKE 7 DAYS PRIOR TO TEST- Trulicity (dulaglutide) Ozempic, Wegovy (semaglutide) Mounjaro (tirzepatide) Bydureon Bcise (exanatide extended release)  DO NOT TAKE 1 DAY PRIOR TO YOUR TEST Rybelsus (semaglutide) Adlyxin (lixisenatide) Victoza (liraglutide) Byetta (exanatide) ___________________________________________________________________________  Follow up in 3 months.  Thank you for entrusting me with your care and for choosing [facility],  Dr. Eugenia Hess  _______________________________________________________  If your blood pressure at your visit was 140/90 or greater, please contact your primary care physician to follow up on this.  _______________________________________________________  If you are age 62 or older, your body mass index should be between 23-30. Your Body mass index is 17.08 kg/m. If this is out of the aforementioned range listed, please consider follow up with your Primary Care Provider.  If you are age 58 or younger, your body mass index should be between 19-25. Your Body mass index is 17.08 kg/m. If this is out of the aformentioned range listed, please consider follow up with your Primary Care Provider.   ________________________________________________________  The [facility] providers would like to encourage you to use MYCHART to communicate with providers for non-urgent requests or questions.  Due to long hold times on the telephone, sending your provider a message by [facility] may be a faster and more efficient way to get a response.  Please allow 48 business hours for a response.  Please remember that this is for non-urgent  requests.  _______________________________________________________

## 2024-04-20 NOTE — Patient Instructions (Signed)
 Continue current medications including amitriptyline  nightly Continue with Tylenol  or ibuprofen  as needed for headaches Increase fluid intake Please follow-up with the head CT ordered by your PCP Continue to follow with PCP Return as needed.

## 2024-05-22 NOTE — Progress Notes (Unsigned)
 [facility] History and Physical   Primary Care Physician:  Elicia Sharper, DO   Reason for Procedure:  Chronic abdominal pain  Plan:    Upper endoscopy     HPI: [name] [name] is a 63 y.o. female undergoing upper endoscopy for investigation of chronic abdominal pain.  At a recent clinic visit she described constant abdominal discomfort across her left and mid abdomen.  Reports the pain is exacerbated by eating any type of food and seemingly progresses throughout the course of the day.  Abdominal imaging has been unremarkable.  No documented history of gastritis or peptic ulcer disease.   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    Difficulty sleeping    Encounter for completion of form with patient 10/23/2021   Headache 07/18/2018   Hypokalemia 06/27/2020   Influenza vaccine administered 08/06/2021   Literacy level of illiterate 10/01/2016   Less than one year of formal education in [place].  Ms Mcnaught is taking classes for reading and writing in [place].    S/P colostomy [facility]) 03/02/2015   March 2016: colon perforation following screening colonoscopy    S/P colostomy [facility]) 03/02/2015   March 2016: colon perforation following screening colonoscopy    SBO (small bowel obstruction) (HCC) 03/18/2021    Past Surgical History:  Procedure Laterality Date   ABDOMINAL HYSTERECTOMY  1994   APPENDECTOMY  1993   CESAREAN SECTION  1992   COLONOSCOPY N/A 05/30/2015   Procedure: COLONOSCOPY;  Surgeon: Bernarda Ned, MD;  [facility];  Service: Endoscopy;  Laterality: N/A;   COLONOSCOPY WITH PROPOFOL  N/A 06/12/2015   Procedure: COLONOSCOPY WITH PROPOFOL ;   Surgeon: Bernarda Ned, MD;  Location: [facility];  Service: Endoscopy;  Laterality: N/A;   COLOSTOMY  01/2015   COLOSTOMY TAKEDOWN N/A 07/02/2015   Procedure: LAPAROSCOPIC COLOSTOMY REVERSAL SPLENIC FLEXURE MOBILIZATION AND PARTIAL COLECTOMY;  Surgeon: Bernarda Ned, MD;  Location: [facility];  Service: General;  Laterality: N/A;   DILATION AND CURETTAGE OF UTERUS  1990   miscarriage   LAPAROTOMY N/A 02/20/2015   Procedure: PARTIAL COLECTOMY END  ILEOSTOMY AND HARTMAN'S PROCEDURE;  Surgeon: Bernarda Ned, MD;  Location: [facility];  Service: General;  Laterality: N/A;    Prior to Admission medications   Medication Sig Start Date End Date Taking? Authorizing Provider  acetaminophen  (TYLENOL ) 500 MG tablet Take 1,000 mg by mouth as needed for headache or moderate pain (pain score 4-6).    [provider]  amitriptyline  (ELAVIL ) 10 MG tablet Take 0.5 tablets (5 mg total) by mouth at bedtime. 01/18/24   Suzann Inocente HERO, MD  amLODipine  (NORVASC ) 5 MG tablet Take 1 tablet (5 mg total) by mouth daily. 09/09/23 03/07/24  Tawkaliyar, Roya, DO  donepezil  (ARICEPT ) 5 MG tablet Take 1 tablet (5 mg total) by mouth at bedtime. 04/20/24   Camara, Amadou, MD  ibuprofen  (ADVIL ) 200 MG tablet Take 200 mg by mouth as needed.    [provider]  pantoprazole  (PROTONIX ) 40 MG tablet Take 1 tablet (40 mg total) by mouth daily. Patient not taking: Reported on 04/20/2024 11/11/23   Zheng, Michael, DO  polyethylene glycol powder (GLYCOLAX /MIRALAX ) 17 GM/SCOOP powder  Taking 2 capfuls in liquid by mouth as needed    [provider]    Current Outpatient Medications  Medication Sig Dispense Refill   acetaminophen  (TYLENOL ) 500 MG tablet Take 1,000 mg by mouth as needed for headache or moderate pain (pain score 4-6).     amitriptyline  (ELAVIL ) 10 MG tablet Take 0.5 tablets (5 mg total) by mouth at bedtime. 30 tablet 3   donepezil  (ARICEPT ) 5 MG tablet Take 1 tablet (5 mg total) by mouth at bedtime.  90 tablet 4   ibuprofen  (ADVIL ) 200 MG tablet Take 200 mg by mouth as needed.     amLODipine  (NORVASC ) 5 MG tablet Take 1 tablet (5 mg total) by mouth daily. 90 tablet 1   pantoprazole  (PROTONIX ) 40 MG tablet Take 1 tablet (40 mg total) by mouth daily. (Patient not taking: Reported on 05/23/2024) 90 tablet 3   polyethylene glycol powder (GLYCOLAX /MIRALAX ) 17 GM/SCOOP powder Taking 2 capfuls in liquid by mouth as needed     Current Facility-Administered Medications  Medication Dose Route Frequency Provider Last Rate Last Admin   0.9 %  sodium chloride  infusion  500 mL Intravenous Once Jemmie Ledgerwood M, MD        Allergies as of 05/23/2024   (No Known Allergies)    Family History  Problem Relation Age of Onset   Colon cancer Neg Hx    Rectal cancer Neg Hx    Stomach cancer Neg Hx     Social History   Socioeconomic History   Marital status: Married    Spouse name: Not on file   Number of children: 2   Years of education: 0   Highest education level: Not on file  Occupational History   Occupation: ENVIRONMENTAL SERVICES    Employer: [employer]  Tobacco Use   Smoking status: Every Day    Current packs/day: 0.50    Average packs/day: 0.5 packs/day for 37.0 years (18.5 ttl pk-yrs)    Types: Cigarettes   Smokeless tobacco: Never   Tobacco comments:    1/2 pack per day  Vaping Use   Vaping status: Never Used  Substance and Sexual Activity   Alcohol use: No    Alcohol/week: 0.0 standard drinks of alcohol   Drug use: No   Sexual activity: Not Currently  Other Topics Concern   Not on file  Social History Narrative   Lives with daughter/daughters husband and their 4 kids   Illiterate in any language.   Less than one year of formal education in [place].    Previously worked at [employer]   From: [place] but speaks English okay       Right handed    [place] daily (1 bottle per day)   Social Drivers of Corporate investment banker Strain: Low Risk  (03/29/2023)    Overall Financial Resource Strain (CARDIA)    Difficulty of Paying Living Expenses: Not hard at all  Food Insecurity: No Food Insecurity (10/22/2023)   Hunger Vital Sign    Worried About Running Out of Food in the Last Year: Never true    Ran Out of Food in the Last Year: Never true  Transportation Needs: No Transportation Needs (10/22/2023)   PRAPARE - Administrator, Civil Service (Medical): No    Lack of Transportation (Non-Medical): No  Physical Activity: Not on file  Stress: No Stress Concern Present (03/29/2023)   [employer] of Occupational Health - Occupational Stress Questionnaire    Feeling  of Stress : Not at all  Social Connections: Moderately Integrated (03/29/2023)   Social Connection and Isolation Panel    Frequency of Communication with Friends and Family: More than three times a week    Frequency of Social Gatherings with Friends and Family: More than three times a week    Attends Religious Services: More than 4 times per year    Active Member of [employer] or Organizations: No    Attends Banker Meetings: Never    Marital Status: Married  Catering manager Violence: Not At Risk (10/22/2023)   Humiliation, Afraid, Rape, and Kick questionnaire    Fear of Current or Ex-Partner: No    Emotionally Abused: No    Physically Abused: No    Sexually Abused: No    Review of Systems:  All other review of systems negative except as mentioned in the HPI.  Physical Exam: Vital signs BP (!) 149/96   Pulse 71   Temp 98.4 F (36.9 C) (Temporal)   Resp (!) 24   Ht 4' 7 (1.397 m)   Wt 73 lb (33.1 kg)   SpO2 100%   BMI 16.97 kg/m   General:   Alert,  Well-developed, well-nourished, pleasant and cooperative in NAD Airway:  Mallampati 1 Lungs:  Clear throughout to auscultation.   Heart:  Regular rate and rhythm; no murmurs, clicks, rubs,  or gallops. Abdomen:  Soft, nontender and nondistended. Normal bowel sounds.   Neuro/Psych:  Normal mood and  affect. A and O x 3  Inocente Hausen, MD [facility]

## 2024-05-23 ENCOUNTER — Encounter: Payer: Self-pay | Admitting: Pediatrics

## 2024-05-23 ENCOUNTER — Ambulatory Visit: Admitting: Pediatrics

## 2024-05-23 VITALS — BP 148/83 | HR 78 | Temp 98.4°F | Resp 15 | Ht <= 58 in | Wt 73.0 lb

## 2024-05-23 DIAGNOSIS — R109 Unspecified abdominal pain: Secondary | ICD-10-CM

## 2024-05-23 DIAGNOSIS — K3189 Other diseases of stomach and duodenum: Secondary | ICD-10-CM | POA: Diagnosis not present

## 2024-05-23 DIAGNOSIS — K319 Disease of stomach and duodenum, unspecified: Secondary | ICD-10-CM | POA: Diagnosis not present

## 2024-05-23 MED ORDER — SODIUM CHLORIDE 0.9 % IV SOLN: 500.0000 mL | Freq: Once | INTRAVENOUS | Status: DC

## 2024-05-23 NOTE — Progress Notes (Signed)
 Called to room to assist during endoscopic procedure.  Patient ID and intended procedure confirmed with present staff. Received instructions for my participation in the procedure from the performing physician.

## 2024-05-23 NOTE — Patient Instructions (Signed)
 YOU HAD AN ENDOSCOPIC PROCEDURE TODAY AT THE [facility]:   Refer to the procedure report that was given to you for any specific questions about what was found during the examination.  If the procedure report does not answer your questions, please call your gastroenterologist to clarify.  If you requested that your care partner not be given the details of your procedure findings, then the procedure report has been included in a sealed envelope for you to review at your convenience later.  YOU SHOULD EXPECT: Some feelings of bloating in the abdomen. Passage of more gas than usual.  Walking can help get rid of the air that was put into your GI tract during the procedure and reduce the bloating. If you had a lower endoscopy (such as a colonoscopy or flexible sigmoidoscopy) you may notice spotting of blood in your stool or on the toilet paper. If you underwent a bowel prep for your procedure, you may not have a normal bowel movement for a few days.  Please Note:  You might notice some irritation and congestion in your nose or some drainage.  This is from the oxygen used during your procedure.  There is no need for concern and it should clear up in a day or so.  SYMPTOMS TO REPORT IMMEDIATELY:  ew, acute  Fever of 100F or higher  Following upper endoscopy (EGD)  Vomiting of blood or coffee ground material  New chest pain or pain under the shoulder blades  Painful or persistently difficult swallowing  New shortness of breath  Fever of 100F or higher  Black, tarry-looking stools  For urgent or emergent issues, a gastroenterologist can be reached at any hour by calling [phone]. Do not use MyChart messaging for urgent concerns.    DIET:  We do recommend a small meal at first, but then you may proceed to your regular diet.  Drink plenty of fluids but you should avoid alcoholic beverages for 24 hours.  ACTIVITY:  You should plan to take it easy for the rest of today and you should  NOT DRIVE or use heavy machinery until tomorrow (because of the sedation medicines used during the test).    FOLLOW UP: Our staff will call the number listed on your records the next business day following your procedure.  We will call around 7:15- 8:00 am to check on you and address any questions or concerns that you may have regarding the information given to you following your procedure. If we do not reach you, we will leave a message.     If any biopsies were taken you will be contacted by phone or by letter within the next 1-3 weeks.  Please call us  at [phone] if you have not heard about the biopsies in 3 weeks.    SIGNATURES/CONFIDENTIALITY: You and/or your care partner have signed paperwork which will be entered into your electronic medical record.  These signatures attest to the fact that that the information above on your After Visit Summary has been reviewed and is understood.  Full responsibility of the confidentiality of this discharge information lies with you and/or your care-partner.

## 2024-05-23 NOTE — Op Note (Signed)
 [facility] Patient Name: [name] [name] Procedure Date: 05/23/2024 3:42 PM MRN: [id number] Endoscopist: Inocente Hausen , MD, [id number] Age: 63 Referring MD:  Date of Birth: Mar 31, 1961 Gender: Female Account #: [id number] Procedure:                Upper GI endoscopy Indications:              Generalized abdominal pain Medicines:                Monitored Anesthesia Care Procedure:                Pre-Anesthesia Assessment:                           - Prior to the procedure, a History and Physical                            was performed, and patient medications and                            allergies were reviewed. The patient's tolerance of                            previous anesthesia was also reviewed. The risks                            and benefits of the procedure and the sedation                            options and risks were discussed with the patient.                            All questions were answered, and informed consent                            was obtained. Prior Anticoagulants: The patient has                            taken no anticoagulant or antiplatelet agents. ASA                            Grade Assessment: II - A patient with mild systemic                            disease. After reviewing the risks and benefits,                            the patient was deemed in satisfactory condition to                            undergo the procedure.                           After obtaining informed consent, the endoscope was  passed under direct vision. Throughout the                            procedure, the patient's blood pressure, pulse, and                            oxygen saturations were monitored continuously. The                            Olympus Scope [id number] was introduced through the                            mouth, and advanced to the second part of duodenum.                            The upper GI endoscopy was  accomplished without                            difficulty. The patient tolerated the procedure                            well. Scope In: Scope Out: Findings:                 The examined esophagus was normal.                           The gastric body, gastric antrum, cardia (on                            retroflexion) and gastric fundus (on retroflexion)                            were normal. Biopsies were taken with a cold                            forceps for Helicobacter pylori testing.                           The duodenal bulb and second portion of the                            duodenum were normal. Biopsies for histology were                            taken with a cold forceps for evaluation of celiac                            disease. Complications:            No immediate complications. Estimated blood loss:                            Minimal. Estimated Blood Loss:     Estimated blood loss was minimal. Impression:               -  Normal esophagus.                           - Normal gastric body, antrum, cardia and gastric                            fundus. Biopsied.                           - Normal duodenal bulb and second portion of the                            duodenum. Biopsied. Recommendation:           - Discharge patient to home (ambulatory).                           - Await pathology results.                           - The findings and recommendations were discussed                            with the patient's family.                           - Return to GI clinic as previously scheduled.                           - Patient has a contact number available for                            emergencies. The signs and symptoms of potential                            delayed complications were discussed with the                            patient. Return to normal activities tomorrow.                            Written discharge instructions were provided to the                             patient. Inocente Hausen, MD 05/23/2024 4:08:08 PM This report has been signed electronically.

## 2024-05-23 NOTE — Progress Notes (Signed)
 Pt's states no medical or surgical changes since previsit or office visit.

## 2024-05-23 NOTE — Progress Notes (Signed)
 Pt sedate, gd SR's, VSS, report to RN

## 2024-05-24 ENCOUNTER — Telehealth: Payer: Self-pay | Admitting: *Deleted

## 2024-05-24 NOTE — Telephone Encounter (Signed)
  Follow up Call-     05/23/2024    3:20 PM 09/09/2021    9:59 AM  Call back number  Post procedure Call Back phone  # [phone]  Permission to leave phone message Yes Yes     Patient questions:  Do you have a fever, pain , or abdominal swelling? No. Pain Score  0 *  Have you tolerated food without any problems? Yes.    Have you been able to return to your normal activities? Yes.    Do you have any questions about your discharge instructions: Diet   No. Medications  No. Follow up visit  No.  Do you have questions or concerns about your Care? No.  Actions: * If pain score is 4 or above: No action needed, pain <4.

## 2024-05-29 LAB — SURGICAL PATHOLOGY

## 2024-05-30 ENCOUNTER — Ambulatory Visit: Payer: Self-pay | Admitting: Pediatrics

## 2024-06-18 ENCOUNTER — Encounter: Payer: Self-pay | Admitting: Student

## 2024-07-11 NOTE — Progress Notes (Unsigned)
 [facility] Return Visit   Referring Provider Elicia Sharper, DO [address] [place],  [place] [address]  Primary Care Provider Elicia Sharper, DO  Patient Profile: [name] [name] is a 63 y.o. female who returns to the [facility] for follow-up of the problem(s) noted below.  Problem List: Chronic abdominal pain History of sigmoid colon perforation during colonoscopy 2016 status postresection History of SBO secondary to adhesive disease Colon cancer screening-normal colonoscopy 2016-repeat 2026 Microcytic anemia  [name]. Langi is accompanied to the office today by a Karole Crea interpreter   History of Present Illness   [name] [name] was last seen in the GI office 04/20/2024   Current GI Meds  Dulcolax PRN MiraLAX  PRN  Interval History  Discussed the use of AI scribe software for clinical note transcription with the patient, who gave verbal consent to proceed.  Prior History from clinic visits February and May 2025:  In speaking with [name]. Perales as well as reviewing her prior medical records, her history is noteworthy for the following:  History from Dr. Eda clinic note 08/28/2021  [place] 1991 after a miscarriage. But, did not have GI symptoms until 2016 when she near daily abdominal pain after her colectomy.   Colon perforation happened during a screening colonoscopy in 2016. She underwent colectomy with colostomy and subsequent reversal with Dr. Debby in 2016. She had a normal colonoscopy with Dr. Debby 06/12/15.  Hospitalized in April 2022 for CT-diagnosed partial small bowel obstruction that resolved with small bowel obstruction protocol.  [name] [name] was readmitted to [facility] 10/22/2023 - 10/23/2023 for acute abdominal pain, nausea and vomiting.  CTAP showed ileus versus small bowel obstruction.  She was managed conservatively with Zofran , Toradol  and NG tube decompression.  A Gastrografin  study  showed contrast reaching the colon.  She was discharged from the hospital uneventfully.  She followed up in primary care clinic 12/09/2023 reporting ongoing abdominal pain.  Physical examination that day revealed diffuse tenderness prompting a follow-up CT scan.  There were no acute findings on the CT.  Stomach was partially distended without acute change.  Small bowel was nondilated.  At the time of her visit in 12/2023 she described constant abdominal discomfort across her left and mid abdomen.  She reported that her pain was exacerbated by eating any type of food and seemingly progressed throughout the course of the day.  In light of her otherwise normal studies, I discussed with her daughter that her pain may be related to adhesive disease and visceral hypersensitivity.  As such, I recommended a trial of amitriptyline  2.5 mg p.o. nightly.  [name]. Gelber daughter states that her mother has tried both pantoprazole  and low-dose amitriptyline  but felt they were too strong so she stopped them.  When queried what she meant by this she seemed to indicate that the medicines made her feel fatigued and weak.  Her pain is essentially unchanged today.  Denies obstructive symptoms, nausea or vomiting.  Does have a history of concomitant headaches which made amitriptyline  an attractive consideration.  No melena or hematochezia.  History of Present Illness - [name] [name] underwent an EGD 05/2024-endoscopically normal with reactive gastropathy on pathology - No evidence of H. pylori infection or celiac disease on EGD biopsies  - At today's visit, she reports that her abdominal pain is unchanged - Pain is achy, hot, and burning in character located predominantly over the upper abdomen - No associated bloating - Regular passage  of gas - Pain is alleviated by walking, standing, and stretching -thinks that this may have contributed to some improvement - Pain is worsened by prolonged inactivity - Notes that she will  experience some discomfort when sitting down to eat in the morning - Describes needing to hold her abdomen and then walk around in order to alleviate discomfort - Finds that the distraction of exercise is helpful - Has a history of SBO but denies recent symptoms of nausea, vomiting or abdominal distention - Denies symptoms of reflux, regurgitation, pyrosis, water brash, dysphagia or odynophagia  - Bowel movements are regular with high-fat foods and daily milk consumption - Constipation occurs with dry foods or lean meats - Occasional use of Miralax  and Dulcolax, but not daily  - Weight stable at 72 pounds  - We discussed that her pain could be due to adhesions or a form of nonulcer dyspepsia - Considered options of treatment trials with Carafate  or Fdgard - She informs me there was a medication that seemed to help her that she tolerated but she cannot recall the name-request that we send a note to her her daughter to check the medication bottle at home   GI Review of Symptoms Significant for chronic abdominal pain and constipation. Otherwise negative.  General Review of Systems  Review of systems is significant for the pertinent positives and negatives as listed per the HPI.  Full ROS is otherwise negative.  Past Medical History   Past Medical History:  Diagnosis Date   Anemia 07/27/2016   Annual physical exam 06/27/2020   Atypical chest pain 08/07/2020   Clotting disorder (HCC)    Colostomy stricture (HCC) 07/02/2015   Constipation    Cough 08/07/2020   Depression    Diarrhea 11/13/2020   Acute watery diarrhea starting 12/14 associated with abdominal pain, headache, feeling hot and cold.    Difficulty sleeping    Encounter for completion of form with patient 10/23/2021   Headache 07/18/2018   Hypokalemia 06/27/2020   Influenza vaccine administered 08/06/2021   Literacy level of illiterate 10/01/2016   Less than one year of formal education in [place].  [name] [name] is taking  classes for reading and writing in [place].    S/P colostomy [facility]) 03/02/2015   March 2016: colon perforation following screening colonoscopy    S/P colostomy [facility]) 03/02/2015   March 2016: colon perforation following screening colonoscopy    SBO (small bowel obstruction) (HCC) 03/18/2021     Past Surgical History   Past Surgical History:  Procedure Laterality Date   ABDOMINAL HYSTERECTOMY  1994   APPENDECTOMY  1993   CESAREAN SECTION  1992   COLONOSCOPY N/A 05/30/2015   Procedure: COLONOSCOPY;  Surgeon: Bernarda Ned, MD;  Location: [facility];  Service: Endoscopy;  Laterality: N/A;   COLONOSCOPY WITH PROPOFOL  N/A 06/12/2015   Procedure: COLONOSCOPY WITH PROPOFOL ;  Surgeon: Bernarda Ned, MD;  Location: [facility];  Service: Endoscopy;  Laterality: N/A;   COLOSTOMY  01/2015   COLOSTOMY TAKEDOWN N/A 07/02/2015   Procedure: LAPAROSCOPIC COLOSTOMY REVERSAL SPLENIC FLEXURE MOBILIZATION AND PARTIAL COLECTOMY;  Surgeon: Bernarda Ned, MD;  Location: [facility];  Service: General;  Laterality: N/A;   DILATION AND CURETTAGE OF UTERUS  1990   miscarriage   LAPAROTOMY N/A 02/20/2015   Procedure: PARTIAL COLECTOMY END  ILEOSTOMY AND HARTMAN'S PROCEDURE;  Surgeon: Bernarda Ned, MD;  Location: [facility];  Service: General;  Laterality: N/A;     Allergies and Medications  No Known Allergies   Current Meds  Medication Sig   amitriptyline  (ELAVIL ) 10 MG tablet Take 0.5 tablets (5 mg total) by mouth at bedtime.   donepezil  (ARICEPT ) 5 MG tablet Take 1 tablet (5 mg total) by mouth at bedtime.   ibuprofen  (ADVIL ) 200 MG tablet Take 200 mg by mouth as needed.    Family History   Family History  Problem Relation Age of Onset   Colon cancer Neg Hx    Rectal cancer Neg Hx    Stomach cancer Neg Hx     Social History   Social History   Tobacco Use   Smoking status: Every Day    Current packs/day: 0.50    Average packs/day: 0.5 packs/day for 37.0 years (18.5 ttl pk-yrs)    Types: Cigarettes    Smokeless tobacco: Never   Tobacco comments:    1/2 pack per day  Vaping Use   Vaping status: Never Used  Substance Use Topics   Alcohol use: No    Alcohol/week: 0.0 standard drinks of alcohol   Drug use: No   Matika reports that she has been smoking cigarettes. She has a 18.5 pack-year smoking history. She has never used smokeless tobacco. She reports that she does not drink alcohol and does not use drugs.  Vital Signs and Physical Examination   Vitals:   07/12/24 0945  BP: 126/80  Pulse: 77   Body mass index is 16.92 kg/m. Weight: 72 lb 12.8 oz (33 kg)  General: Petite, thin Head: Normocephalic and atraumatic Mouth: No deformities or lesions noted Lungs: Clear throughout to auscultation Heart: Regular rate and rhythm; No murmurs, rubs or bruits Abdomen: Soft, to palpation in the mid abdomen, left lower quadrant and left upper quadrant and non distended -pain over upper abdominal scar. No masses, hepatosplenomegaly or hernias noted. Normal Bowel sounds,  Rectal: Deferred Musculoskeletal: Symmetrical with no gross deformities    Review of Data  The following data was reviewed at the time of this encounter:  Laboratory Studies      Latest Ref Rng & Units 10/23/2023    8:24 AM 10/22/2023   11:54 AM 03/25/2022   11:37 AM  CBC  WBC 4.0 - 10.5 K/uL 6.1  12.6  5.1   Hemoglobin 12.0 - 15.0 g/dL 89.9  88.6  9.7   Hematocrit 36.0 - 46.0 % 32.5  34.5  31.2   Platelets 150 - 400 K/uL 206  195  245     Lab Results  Component Value Date   LIPASE 14 10/22/2023      Latest Ref Rng & Units 10/23/2023    8:24 AM 10/22/2023   11:54 AM 10/01/2022   11:14 AM  CMP  Glucose 70 - 99 mg/dL 87  878  87   BUN 8 - 23 mg/dL 6  10  6    Creatinine 0.44 - 1.00 mg/dL 9.22  9.43  9.41   Sodium 135 - 145 mmol/L 140  135  140   Potassium 3.5 - 5.1 mmol/L 3.5  3.3  3.4   Chloride 98 - 111 mmol/L 108  97  97   CO2 22 - 32 mmol/L 23  29  29    Calcium 8.9 - 10.3 mg/dL 8.8  9.1  9.4   Total  Protein 6.5 - 8.1 g/dL  7.0    Total Bilirubin <1.2 mg/dL  0.5    Alkaline Phos 38 - 126 U/L  96    AST 15 - 41 U/L  13    ALT 0 - 44  U/L  6       Imaging Studies  - CT 12/09/23: No acute findings - CT 10/22/23: A few dilated small bowel loops-ileus versus SBO - CT 03/18/21: small bowel obstruction in the lower abdomen, evidence for prior surgery - CT 08/14/21: No acute process; large calcification in the spleen and RLQ mesentery - CT 08/18/21: Moderate stool throughout the colon without obstruction, changes in the spleen and right lower quadrant - likely sequela of remote trauma or inflammation    GI Procedures and Studies  EGD 05/23/2024 Normal Path: Reactive gastropathy without H. pylori, normal duodenal mucosa without celiac disease  Colonoscopy 05/2015 Normal  Colonoscopy 01/2015 Colonic perforation at the sigmoid colon  Clinical Impression  It is my clinical impression that [name]. Dlugosz is a 63 y.o. female with;  Chronic abdominal pain History of sigmoid colon perforation during colonoscopy 2016 status post resection History of SBO secondary to adhesive disease Colon cancer screening-normal colonoscopy 2016-repeat 2026 Microcytic anemia   [name]. Suthers returns to the office for follow-up of chronic abdominal pain.  Her prior medical history is noteworthy for sigmoid colon perforation during colonoscopy in 2016 status post sigmoid resection.  Her bowel has since been anastomosed in continuity.  Over the ensuing years she has had intermittent small bowel obstructions secondary to adhesive disease.  Most recently hospitalized 09/2023.  She complains of chronic abdominal pain that has been present since her surgery.  Last CT 11/2023 did not show any acute abnormalities.  EGD 05/2024 showed reactive gastropathy without H. pylori.  Duodenal biopsies were negative for celiac disease.  We have discussed that her current symptoms could be related to adhesive disease or nonulcer dyspepsia.  I  previously advised a trial of amitriptyline  2.5 mg p.o. nightly as well as pantoprazole .  She stopped both of these medications because she stated that the medicines were too strong.  She thinks the medications made her feel weak and dizzy.   At today's visit, she informs me that exercise and physical activity seems to improve her symptoms.  That could point to a component of adhesive disease.  She also reports that there is a medication she has at home that she does think was helpful and she tolerated well but she cannot recall the name.  She will check her medication bottle and have her daughter contact our office to see if we can provide a refill.  I have considered the possibility of other medication options such as FDgard, cyproheptadine, gabapentin , Lyrica or Topamax.  Her labs have shown evidence of chronic anemia.  There has been concern for iron  deficiency in the past which has been treated with iron  supplementation.  No documentation of thalassemia.  Labs can be updated again in the future.  Plan  Request that daughter contact her office with name of medication that [name]. Mercier found helpful so that we can provide a refill if appropriate. Continue stretching and physical activity which seems to alleviate her abdominal discomfort Continue MiraLAX  and Dulcolax as needed Other medical options for management of her chronic abdominal pain could include FDgard, cyproheptadine gabapentin , Lyrica and Topamax Continue MiraLAX  and Dulcolax as needed Monitor for signs and symptoms of SBO-her daughter is aware that if she develops severe pain and cessation of bowel movements she should come to the emergency room Consider updated laboratory studies for iron  deficiency anemia in the future Colonoscopy 2016 - normal - next screening colonoscopy due 2026  Planned Follow Up 4 months  The patient or caregiver verbalized  understanding of the material covered, with no barriers to understanding. All questions  were answered. Patient or caregiver is agreeable with the plan outlined above.    It was a pleasure to see Avari.  If you have any questions or concerns regarding this evaluation, do not hesitate to contact me.  Inocente Hausen, MD [facility]   I spent total of 30 minutes in both face-to-face (20 minutes interview) and non-face-to-face (10 minutes chart review, care coordination, documentation)  activities, excluding procedures performed, for the visit on the date of this encounter.

## 2024-07-12 ENCOUNTER — Encounter: Payer: Self-pay | Admitting: Pediatrics

## 2024-07-12 ENCOUNTER — Ambulatory Visit: Admitting: Pediatrics

## 2024-07-12 VITALS — BP 126/80 | HR 77 | Ht <= 58 in | Wt 72.8 lb

## 2024-07-12 DIAGNOSIS — Z8719 Personal history of other diseases of the digestive system: Secondary | ICD-10-CM | POA: Diagnosis not present

## 2024-07-12 DIAGNOSIS — D649 Anemia, unspecified: Secondary | ICD-10-CM | POA: Diagnosis not present

## 2024-07-12 DIAGNOSIS — K5900 Constipation, unspecified: Secondary | ICD-10-CM

## 2024-07-12 DIAGNOSIS — R109 Unspecified abdominal pain: Secondary | ICD-10-CM

## 2024-07-12 NOTE — Patient Instructions (Addendum)
 Please call or send a MyChart message to our office with the name of the medication that helped with your abdominal pain so that we can send in a refill.  Follow up in 4 months.  Thank you for entrusting me with your care and for choosing [facility], Dr. Inocente Hausen  _______________________________________________________  If your blood pressure at your visit was 140/90 or greater, please contact your primary care physician to follow up on this.  _______________________________________________________  If you are age 49 or older, your body mass index should be between 23-30. Your Body mass index is 16.92 kg/m. If this is out of the aforementioned range listed, please consider follow up with your Primary Care Provider.  If you are age 79 or younger, your body mass index should be between 19-25. Your Body mass index is 16.92 kg/m. If this is out of the aformentioned range listed, please consider follow up with your Primary Care Provider.   ________________________________________________________  The [facility] providers would like to encourage you to use MYCHART to communicate with providers for non-urgent requests or questions.  Due to long hold times on the telephone, sending your provider a message by [facility] may be a faster and more efficient way to get a response.  Please allow 48 business hours for a response.  Please remember that this is for non-urgent requests.  _______________________________________________________  [facility] is using a team-based approach to care.  Your team is made up of your doctor and two to three APPS. Our APPS (Nurse Practitioners and Physician Assistants) work with your physician to ensure care continuity for you. They are fully qualified to address your health concerns and develop a treatment plan. They communicate directly with your gastroenterologist to care for you. Seeing the Advanced Practice Practitioners on your physician's  team can help you by facilitating care more promptly, often allowing for earlier appointments, access to diagnostic testing, procedures, and other specialty referrals.

## 2024-08-30 ENCOUNTER — Ambulatory Visit (INDEPENDENT_AMBULATORY_CARE_PROVIDER_SITE_OTHER)

## 2024-08-30 ENCOUNTER — Encounter (HOSPITAL_COMMUNITY): Payer: Self-pay

## 2024-08-30 ENCOUNTER — Ambulatory Visit (HOSPITAL_COMMUNITY)
Admission: EM | Admit: 2024-08-30 | Discharge: 2024-08-30 | Disposition: A | Discharge status: Home / Self Care | Attending: Emergency Medicine | Admitting: Emergency Medicine

## 2024-08-30 DIAGNOSIS — J441 Chronic obstructive pulmonary disease with (acute) exacerbation: Secondary | ICD-10-CM | POA: Diagnosis not present

## 2024-08-30 DIAGNOSIS — R051 Acute cough: Secondary | ICD-10-CM

## 2024-08-30 MED ORDER — PREDNISONE 20 MG PO TABS: 40.0000 mg | ORAL_TABLET | Freq: Every day | ORAL | 0 refills | Status: AC

## 2024-08-30 MED ORDER — PROMETHAZINE-DM 6.25-15 MG/5ML PO SYRP: 5.0000 mL | ORAL_SOLUTION | Freq: Every evening | ORAL | 0 refills | Status: AC | PRN

## 2024-08-30 MED ORDER — ALBUTEROL SULFATE HFA 108 (90 BASE) MCG/ACT IN AERS: 1.0000 | INHALATION_SPRAY | Freq: Four times a day (QID) | RESPIRATORY_TRACT | 0 refills | Status: AC | PRN

## 2024-08-30 MED ORDER — BENZONATATE 100 MG PO CAPS: 100.0000 mg | ORAL_CAPSULE | Freq: Three times a day (TID) | ORAL | 0 refills | Status: AC

## 2024-08-30 MED ORDER — AZITHROMYCIN 250 MG PO TABS: ORAL_TABLET | ORAL | 0 refills | Status: AC

## 2024-08-30 NOTE — ED Triage Notes (Signed)
 Pt c/o cough x3wks. States having stomach pains from coughing so hard. States taken Nyquil with no relief.

## 2024-08-30 NOTE — ED Provider Notes (Signed)
 [facility]    CSN: [id number] Arrival date & time: 08/30/24  1800      History   Chief Complaint Chief Complaint  Patient presents with   Cough    HPI [name] [name] is a 63 y.o. female.   Patient presents with persistent cough for 3 weeks.  Patient states that her cough is sometimes productive, but mostly dry.  Patient states that she has been coughing so hard that she now has soreness to her abdomen from this.  Patient states that she has had some mild intermittent shortness of breath when coughing occurs.  Patient denies chest pain, nausea, vomiting, diarrhea, nasal congestion, sore throat, and fever.  Patient reports that she has been taking NyQuil without relief.  The history is provided by the patient and medical records. The history is limited by a language barrier. No language interpreter was used (Declined interpreter, son is interpreting).  Cough   Past Medical History:  Diagnosis Date   Anemia 07/27/2016   Annual physical exam 06/27/2020   Atypical chest pain 08/07/2020   Clotting disorder    Colostomy stricture (HCC) 07/02/2015   Constipation    Cough 08/07/2020   Depression    Diarrhea 11/13/2020   Acute watery diarrhea starting 12/14 associated with abdominal pain, headache, feeling hot and cold.    Difficulty sleeping    Encounter for completion of form with patient 10/23/2021   Headache 07/18/2018   Hypokalemia 06/27/2020   Influenza vaccine administered 08/06/2021   Literacy level of illiterate 10/01/2016   Less than one year of formal education in [place].  Ms Canizares is taking classes for reading and writing in [place].    S/P colostomy [facility]) 03/02/2015   March 2016: colon perforation following screening colonoscopy    S/P colostomy [facility]) 03/02/2015   March 2016: colon perforation following screening colonoscopy    SBO (small bowel obstruction) (HCC) 03/18/2021    Patient Active Problem List   Diagnosis Date Noted   Nonintractable headache  12/09/2023   Microcytic anemia 11/11/2023   Partial bowel obstruction (HCC) 10/22/2023   Urinary incontinence, urge 09/09/2023   HTN (hypertension) 10/05/2021   Language barrier affecting health care 08/07/2020   Literacy level of illiterate 10/01/2016   Reduced visual acuity 09/30/2016   Healthcare maintenance 07/27/2016   Abdominal pain 01/15/2016    Past Surgical History:  Procedure Laterality Date   ABDOMINAL HYSTERECTOMY  1994   APPENDECTOMY  1993   CESAREAN SECTION  1992   COLONOSCOPY N/A 05/30/2015   Procedure: COLONOSCOPY;  Surgeon: Bernarda Ned, MD;  Location: [facility];  Service: Endoscopy;  Laterality: N/A;   COLONOSCOPY WITH PROPOFOL  N/A 06/12/2015   Procedure: COLONOSCOPY WITH PROPOFOL ;  Surgeon: Bernarda Ned, MD;  Location: [facility];  Service: Endoscopy;  Laterality: N/A;   COLOSTOMY  01/2015   COLOSTOMY TAKEDOWN N/A 07/02/2015   Procedure: LAPAROSCOPIC COLOSTOMY REVERSAL SPLENIC FLEXURE MOBILIZATION AND PARTIAL COLECTOMY;  Surgeon: Bernarda Ned, MD;  Location: [facility];  Service: General;  Laterality: N/A;   DILATION AND CURETTAGE OF UTERUS  1990   miscarriage   LAPAROTOMY N/A 02/20/2015   Procedure: PARTIAL COLECTOMY END  ILEOSTOMY AND HARTMAN'S PROCEDURE;  Surgeon: Bernarda Ned, MD;  Location: [facility];  Service: General;  Laterality: N/A;    OB History   No obstetric history on file.      Home Medications    Prior to Admission medications   Medication Sig Start Date End Date Taking? Authorizing Provider  albuterol  (VENTOLIN  HFA)  108 (90 Base) MCG/ACT inhaler Inhale 1-2 puffs into the lungs every 6 (six) hours as needed for wheezing or shortness of breath. 08/30/24  Yes Johnie, Chez Bulnes A, NP  azithromycin (ZITHROMAX Z-PAK) 250 MG tablet Take 2 pills (500mg ) first day and one pill (250mg ) the remaining 4 days. 08/30/24  Yes Johnie, Tiamarie Furnari A, NP  benzonatate  (TESSALON ) 100 MG capsule Take 1 capsule (100 mg total) by mouth every 8 (eight) hours. 08/30/24   Yes Johnie, Gracelynne Benedict A, NP  predniSONE  (DELTASONE ) 20 MG tablet Take 2 tablets (40 mg total) by mouth daily for 5 days. 08/30/24 09/04/24 Yes Johnie Flaming A, NP  promethazine -dextromethorphan (PROMETHAZINE -DM) 6.25-15 MG/5ML syrup Take 5 mLs by mouth at bedtime as needed for cough. 08/30/24  Yes Johnie, Jontae Adebayo A, NP  acetaminophen  (TYLENOL ) 500 MG tablet Take 1,000 mg by mouth as needed for headache or moderate pain (pain score 4-6). Patient not taking: Reported on 07/12/2024    [provider]  amitriptyline  (ELAVIL ) 10 MG tablet Take 0.5 tablets (5 mg total) by mouth at bedtime. 01/18/24   Suzann Inocente HERO, MD  amLODipine  (NORVASC ) 5 MG tablet Take 1 tablet (5 mg total) by mouth daily. Patient not taking: Reported on 07/12/2024 09/09/23 03/07/24  Tawkaliyar, Roya, DO  donepezil  (ARICEPT ) 5 MG tablet Take 1 tablet (5 mg total) by mouth at bedtime. 04/20/24   Camara, Amadou, MD  ibuprofen  (ADVIL ) 200 MG tablet Take 200 mg by mouth as needed.    [provider]  pantoprazole  (PROTONIX ) 40 MG tablet Take 1 tablet (40 mg total) by mouth daily. Patient not taking: Reported on 07/12/2024 11/11/23   Zheng, Michael, DO  polyethylene glycol powder (GLYCOLAX /MIRALAX ) 17 GM/SCOOP powder Taking 2 capfuls in liquid by mouth as needed Patient not taking: Reported on 07/12/2024    [provider]    Family History Family History  Problem Relation Age of Onset   Colon cancer Neg Hx    Rectal cancer Neg Hx    Stomach cancer Neg Hx     Social History Social History   Tobacco Use   Smoking status: Every Day    Current packs/day: 0.50    Average packs/day: 0.5 packs/day for 37.0 years (18.5 ttl pk-yrs)    Types: Cigarettes   Smokeless tobacco: Never   Tobacco comments:    1/2 pack per day  Vaping Use   Vaping status: Never Used  Substance Use Topics   Alcohol use: No    Alcohol/week: 0.0 standard drinks of alcohol   Drug use: No     Allergies   Patient has no known  allergies.   Review of Systems Review of Systems  Respiratory:  Positive for cough.    Per HPI  Physical Exam Triage Vital Signs ED Triage Vitals [08/30/24 1847]  Encounter Vitals Group     BP (!) 154/81     Girls Systolic BP Percentile      Girls Diastolic BP Percentile      Boys Systolic BP Percentile      Boys Diastolic BP Percentile      Pulse Rate 83     Resp 18     Temp 98.9 F (37.2 C)     Temp Source Oral     SpO2 95 %     Weight      Height      Head Circumference      Peak Flow      Pain Score      Pain  Loc      Pain Education      Exclude from Growth Chart    No data found.  Updated Vital Signs BP (!) 154/81 (BP Location: Left Arm)   Pulse 83   Temp 98.9 F (37.2 C) (Oral)   Resp 18   SpO2 95%   Visual Acuity Right Eye Distance:   Left Eye Distance:   Bilateral Distance:    Right Eye Near:   Left Eye Near:    Bilateral Near:     Physical Exam Vitals and nursing note reviewed.  Constitutional:      General: She is awake. She is not in acute distress.    Appearance: Normal appearance. She is well-developed and well-groomed. She is not ill-appearing.  HENT:     Right Ear: Tympanic membrane, ear canal and external ear normal.     Left Ear: Tympanic membrane, ear canal and external ear normal.     Nose: Nose normal.     Mouth/Throat:     Mouth: Mucous membranes are moist.     Pharynx: Oropharynx is clear. Posterior oropharyngeal erythema present. No oropharyngeal exudate.  Cardiovascular:     Rate and Rhythm: Normal rate and regular rhythm.  Pulmonary:     Effort: Pulmonary effort is normal.     Breath sounds: Normal breath sounds.  Skin:    General: Skin is warm and dry.  Neurological:     Mental Status: She is alert.  Psychiatric:        Behavior: Behavior is cooperative.      UC Treatments / Results  Labs (all labs ordered are listed, but only abnormal results are displayed) Labs Reviewed - No data to  display  EKG   Radiology DG Chest 2 View Result Date: 08/30/2024 EXAM: 2 VIEW(S) XRAY OF THE CHEST 08/30/2024 07:12:01 PM COMPARISON: None available. CLINICAL HISTORY: cough x 3 weeks with mild intermittent shortness of breath. Pt c/o cough x3wks. States having stomach pains from coughing so hard. States taken Nyquil with no relief. FINDINGS: LUNGS AND PLEURA: The lungs are symmetrically hyperinflated in keeping with changes of underlying COPD. HEART AND MEDIASTINUM: No acute abnormality of the cardiac and mediastinal silhouettes. BONES AND SOFT TISSUES: No acute osseous abnormality. IMPRESSION: 1. No acute findings. 2. Symmetrically hyperinflated lungs, consistent with underlying COPD. Electronically signed by: Dorethia Molt MD 08/30/2024 07:24 PM EDT RP Workstation: [id number]    Procedures Procedures (including critical care time)  Medications Ordered in UC Medications - No data to display  Initial Impression / Assessment and Plan / UC Course  I have reviewed the triage vital signs and the nursing notes.  Pertinent labs & imaging results that were available during my care of the patient were reviewed by me and considered in my medical decision making (see chart for details).     Patient is overall well-appearing.  Vitals are stable.  Chest x-ray ordered.  Based on my interpretation there is no active cardiopulmonary disease.  Radiology report reports hyperinflation which is indicative of COPD.  Cough and intermittent shortness of breath likely related to underlying COPD exacerbation.  Prescribed azithromycin and prednisone  for COPD exacerbation.  Prescribed Tessalon  and Promethazine  DM as needed for cough.  Prescribed albuterol  inhaler as needed for shortness of breath or wheezing.  Discussed over-the-counter medications for symptoms as well.  Discussed follow-up and return precautions. Final Clinical Impressions(s) / UC Diagnoses   Final diagnoses:  Acute cough  COPD exacerbation  [facility])     Discharge  Instructions      Your x-ray is negative for any underlying pneumonia. It does reveal some signs of underlying COPD. Your symptoms are likely be related to a COPD exacerbation. Start taking azithromycin by taking 2 tablets today and 1 tablet on the remaining 4 days.   Take 2 tablets of prednisone  once daily for 5 days. You can take Tessalon  every 8 hours as needed for cough. I have also prescribed promethazine  DM cough syrup that you can take at night for cough.  This can make you drowsy so do not take while driving.   I have also prescribed an albuterol  inhaler that you can use every 6 hours as needed for shortness of breath or any wheezing. I also recommend taking over-the-counter Coricidin to help with cough and congestion.   Follow-up with your primary care provider or return here as needed.     ED Prescriptions     Medication Sig Dispense Auth. Provider   azithromycin (ZITHROMAX Z-PAK) 250 MG tablet Take 2 pills (500mg ) first day and one pill (250mg ) the remaining 4 days. 6 tablet Johnie Flaming A, NP   predniSONE  (DELTASONE ) 20 MG tablet Take 2 tablets (40 mg total) by mouth daily for 5 days. 10 tablet Johnie Flaming A, NP   albuterol  (VENTOLIN  HFA) 108 (90 Base) MCG/ACT inhaler Inhale 1-2 puffs into the lungs every 6 (six) hours as needed for wheezing or shortness of breath. 8 g Johnie Flaming A, NP   benzonatate  (TESSALON ) 100 MG capsule Take 1 capsule (100 mg total) by mouth every 8 (eight) hours. 21 capsule Johnie Flaming A, NP   promethazine -dextromethorphan (PROMETHAZINE -DM) 6.25-15 MG/5ML syrup Take 5 mLs by mouth at bedtime as needed for cough. 118 mL Johnie Flaming A, NP      PDMP not reviewed this encounter.   Johnie Flaming A, NP 08/30/24 1941

## 2024-08-30 NOTE — Discharge Instructions (Signed)
 Your x-ray is negative for any underlying pneumonia. It does reveal some signs of underlying COPD. Your symptoms are likely be related to a COPD exacerbation. Start taking azithromycin by taking 2 tablets today and 1 tablet on the remaining 4 days.   Take 2 tablets of prednisone  once daily for 5 days. You can take Tessalon  every 8 hours as needed for cough. I have also prescribed promethazine  DM cough syrup that you can take at night for cough.  This can make you drowsy so do not take while driving.   I have also prescribed an albuterol  inhaler that you can use every 6 hours as needed for shortness of breath or any wheezing. I also recommend taking over-the-counter Coricidin to help with cough and congestion.   Follow-up with your primary care provider or return here as needed.
